# Patient Record
Sex: Female | Born: 1960
Health system: Southern US, Community
[De-identification: ages and names within clinical notes are randomized; demographics above are authoritative.]

## PROBLEM LIST (undated history)

## (undated) DIAGNOSIS — E785 Hyperlipidemia, unspecified: Secondary | ICD-10-CM

## (undated) DIAGNOSIS — I129 Hypertensive chronic kidney disease with stage 1 through stage 4 chronic kidney disease, or unspecified chronic kidney disease: Secondary | ICD-10-CM

## (undated) DIAGNOSIS — R634 Abnormal weight loss: Secondary | ICD-10-CM

## (undated) DIAGNOSIS — J45909 Unspecified asthma, uncomplicated: Secondary | ICD-10-CM

## (undated) DIAGNOSIS — F331 Major depressive disorder, recurrent, moderate: Secondary | ICD-10-CM

## (undated) DIAGNOSIS — F32A Depression, unspecified: Secondary | ICD-10-CM

## (undated) DIAGNOSIS — R0600 Dyspnea, unspecified: Secondary | ICD-10-CM

## (undated) DIAGNOSIS — J209 Acute bronchitis, unspecified: Secondary | ICD-10-CM

## (undated) DIAGNOSIS — F329 Major depressive disorder, single episode, unspecified: Secondary | ICD-10-CM

## (undated) DIAGNOSIS — I10 Essential (primary) hypertension: Secondary | ICD-10-CM

## (undated) DIAGNOSIS — M1990 Unspecified osteoarthritis, unspecified site: Secondary | ICD-10-CM

## (undated) DIAGNOSIS — B20 Human immunodeficiency virus [HIV] disease: Secondary | ICD-10-CM

## (undated) DIAGNOSIS — Z21 Asymptomatic human immunodeficiency virus [HIV] infection status: Secondary | ICD-10-CM

## (undated) DIAGNOSIS — Z9981 Dependence on supplemental oxygen: Secondary | ICD-10-CM

## (undated) DIAGNOSIS — N289 Disorder of kidney and ureter, unspecified: Secondary | ICD-10-CM

## (undated) DIAGNOSIS — G4730 Sleep apnea, unspecified: Secondary | ICD-10-CM

## (undated) DIAGNOSIS — J440 Chronic obstructive pulmonary disease with acute lower respiratory infection: Secondary | ICD-10-CM

## (undated) DIAGNOSIS — K5900 Constipation, unspecified: Secondary | ICD-10-CM

## (undated) HISTORY — DX: Chronic obstructive pulmonary disease with (acute) lower respiratory infection: J44.0

## (undated) HISTORY — DX: Abnormal weight loss: R63.4

## (undated) HISTORY — DX: Human immunodeficiency virus (HIV) disease: B20

## (undated) HISTORY — DX: Hyperlipidemia, unspecified: E78.5

## (undated) HISTORY — DX: Asymptomatic human immunodeficiency virus (hiv) infection status: Z21

## (undated) HISTORY — DX: Hypertensive chronic kidney disease with stage 1 through stage 4 chronic kidney disease, or unspecified chronic kidney disease: I12.9

## (undated) HISTORY — DX: Major depressive disorder, single episode, unspecified: F32.9

## (undated) HISTORY — DX: Major depressive disorder, recurrent, moderate: F33.1

## (undated) HISTORY — DX: Constipation, unspecified: K59.00

## (undated) HISTORY — DX: Unspecified asthma, uncomplicated: J45.909

## (undated) HISTORY — DX: Essential (primary) hypertension: I10

## (undated) HISTORY — DX: Depression, unspecified: F32.A

## (undated) HISTORY — DX: Unspecified osteoarthritis, unspecified site: M19.90

## (undated) HISTORY — DX: Acute bronchitis, unspecified: J20.9

## (undated) HISTORY — DX: Disorder of kidney and ureter, unspecified: N28.9

---

## 1997-07-10 ENCOUNTER — Emergency Department (HOSPITAL_COMMUNITY): Admission: EM | Admit: 1997-07-10 | Discharge: 1997-07-10 | Payer: Self-pay | Admitting: Emergency Medicine

## 1997-08-07 ENCOUNTER — Other Ambulatory Visit: Admission: RE | Admit: 1997-08-07 | Discharge: 1997-08-07 | Payer: Self-pay | Admitting: Family Medicine

## 1997-10-24 ENCOUNTER — Encounter: Payer: Self-pay | Admitting: Family Medicine

## 1997-10-24 ENCOUNTER — Ambulatory Visit (HOSPITAL_COMMUNITY): Admission: RE | Admit: 1997-10-24 | Discharge: 1997-10-24 | Payer: Self-pay | Admitting: Family Medicine

## 2000-04-01 ENCOUNTER — Inpatient Hospital Stay (HOSPITAL_COMMUNITY): Admission: EM | Admit: 2000-04-01 | Discharge: 2000-04-04 | Payer: Self-pay | Admitting: Emergency Medicine

## 2000-04-01 ENCOUNTER — Encounter: Payer: Self-pay | Admitting: Emergency Medicine

## 2000-04-02 ENCOUNTER — Encounter: Payer: Self-pay | Admitting: Infectious Diseases

## 2001-03-22 ENCOUNTER — Other Ambulatory Visit: Admission: RE | Admit: 2001-03-22 | Discharge: 2001-03-22 | Payer: Self-pay | Admitting: Family Medicine

## 2004-03-26 ENCOUNTER — Ambulatory Visit: Payer: Self-pay | Admitting: Family Medicine

## 2004-03-27 ENCOUNTER — Ambulatory Visit (HOSPITAL_COMMUNITY): Admission: RE | Admit: 2004-03-27 | Discharge: 2004-03-27 | Payer: Self-pay | Admitting: Family Medicine

## 2004-03-28 ENCOUNTER — Ambulatory Visit: Payer: Self-pay | Admitting: Family Medicine

## 2004-06-20 ENCOUNTER — Ambulatory Visit: Payer: Self-pay | Admitting: Family Medicine

## 2004-07-03 ENCOUNTER — Ambulatory Visit (HOSPITAL_COMMUNITY): Admission: RE | Admit: 2004-07-03 | Discharge: 2004-07-03 | Payer: Self-pay | Admitting: Family Medicine

## 2004-07-23 ENCOUNTER — Other Ambulatory Visit: Admission: RE | Admit: 2004-07-23 | Discharge: 2004-07-23 | Payer: Self-pay | Admitting: Family Medicine

## 2004-07-23 ENCOUNTER — Ambulatory Visit: Payer: Self-pay | Admitting: Family Medicine

## 2004-07-31 ENCOUNTER — Ambulatory Visit (HOSPITAL_COMMUNITY): Admission: RE | Admit: 2004-07-31 | Discharge: 2004-07-31 | Payer: Self-pay | Admitting: Family Medicine

## 2004-08-07 ENCOUNTER — Ambulatory Visit: Payer: Self-pay | Admitting: Family Medicine

## 2004-09-25 ENCOUNTER — Ambulatory Visit: Payer: Self-pay | Admitting: Family Medicine

## 2004-10-16 ENCOUNTER — Ambulatory Visit: Payer: Self-pay | Admitting: Infectious Diseases

## 2004-10-16 ENCOUNTER — Encounter (INDEPENDENT_AMBULATORY_CARE_PROVIDER_SITE_OTHER): Payer: Self-pay | Admitting: *Deleted

## 2004-10-16 ENCOUNTER — Ambulatory Visit (HOSPITAL_COMMUNITY): Admission: RE | Admit: 2004-10-16 | Discharge: 2004-10-16 | Payer: Self-pay | Admitting: Infectious Diseases

## 2004-10-16 LAB — CONVERTED CEMR LAB: CD4 Count: 70 microliters

## 2004-10-24 ENCOUNTER — Ambulatory Visit: Payer: Self-pay | Admitting: Family Medicine

## 2004-11-13 ENCOUNTER — Ambulatory Visit: Payer: Self-pay | Admitting: Infectious Diseases

## 2004-11-27 ENCOUNTER — Ambulatory Visit: Payer: Self-pay | Admitting: Family Medicine

## 2004-12-11 ENCOUNTER — Encounter (INDEPENDENT_AMBULATORY_CARE_PROVIDER_SITE_OTHER): Payer: Self-pay | Admitting: *Deleted

## 2004-12-11 ENCOUNTER — Ambulatory Visit: Payer: Self-pay | Admitting: Infectious Diseases

## 2004-12-11 ENCOUNTER — Ambulatory Visit (HOSPITAL_COMMUNITY): Admission: RE | Admit: 2004-12-11 | Discharge: 2004-12-11 | Payer: Self-pay | Admitting: Infectious Diseases

## 2004-12-18 ENCOUNTER — Ambulatory Visit: Payer: Self-pay | Admitting: Family Medicine

## 2005-01-01 ENCOUNTER — Ambulatory Visit: Payer: Self-pay | Admitting: Family Medicine

## 2005-01-15 ENCOUNTER — Ambulatory Visit: Payer: Self-pay | Admitting: Family Medicine

## 2005-02-06 ENCOUNTER — Ambulatory Visit: Payer: Self-pay | Admitting: Family Medicine

## 2005-02-06 ENCOUNTER — Encounter: Payer: Self-pay | Admitting: Family Medicine

## 2005-02-06 ENCOUNTER — Other Ambulatory Visit: Admission: RE | Admit: 2005-02-06 | Discharge: 2005-02-06 | Payer: Self-pay | Admitting: Family Medicine

## 2005-02-12 ENCOUNTER — Ambulatory Visit: Payer: Self-pay | Admitting: Infectious Diseases

## 2005-02-12 ENCOUNTER — Encounter (INDEPENDENT_AMBULATORY_CARE_PROVIDER_SITE_OTHER): Payer: Self-pay | Admitting: *Deleted

## 2005-02-12 LAB — CONVERTED CEMR LAB: HIV 1 RNA Quant: 399 copies/mL

## 2005-03-05 ENCOUNTER — Ambulatory Visit: Payer: Self-pay | Admitting: Family Medicine

## 2005-05-28 ENCOUNTER — Encounter: Admission: RE | Admit: 2005-05-28 | Discharge: 2005-05-28 | Payer: Self-pay | Admitting: Infectious Diseases

## 2005-05-28 ENCOUNTER — Ambulatory Visit: Payer: Self-pay | Admitting: Infectious Diseases

## 2005-05-28 ENCOUNTER — Encounter (INDEPENDENT_AMBULATORY_CARE_PROVIDER_SITE_OTHER): Payer: Self-pay | Admitting: *Deleted

## 2005-05-28 LAB — CONVERTED CEMR LAB: CD4 Count: 300 microliters

## 2005-06-17 ENCOUNTER — Ambulatory Visit: Payer: Self-pay | Admitting: Family Medicine

## 2005-06-18 ENCOUNTER — Ambulatory Visit: Payer: Self-pay | Admitting: Family Medicine

## 2005-06-22 ENCOUNTER — Ambulatory Visit: Payer: Self-pay | Admitting: Family Medicine

## 2005-07-24 ENCOUNTER — Ambulatory Visit: Payer: Self-pay | Admitting: Infectious Diseases

## 2005-10-16 ENCOUNTER — Ambulatory Visit: Payer: Self-pay | Admitting: Family Medicine

## 2005-11-13 ENCOUNTER — Ambulatory Visit: Payer: Self-pay | Admitting: Family Medicine

## 2005-11-20 ENCOUNTER — Ambulatory Visit: Payer: Self-pay | Admitting: Family Medicine

## 2005-11-23 ENCOUNTER — Ambulatory Visit: Payer: Self-pay | Admitting: Family Medicine

## 2005-11-26 ENCOUNTER — Encounter: Admission: RE | Admit: 2005-11-26 | Discharge: 2005-11-26 | Payer: Self-pay | Admitting: Infectious Diseases

## 2005-11-26 ENCOUNTER — Ambulatory Visit: Payer: Self-pay | Admitting: Infectious Diseases

## 2005-11-26 ENCOUNTER — Encounter (INDEPENDENT_AMBULATORY_CARE_PROVIDER_SITE_OTHER): Payer: Self-pay | Admitting: *Deleted

## 2005-11-26 LAB — CONVERTED CEMR LAB
ALT: 30 units/L (ref 0–35)
AST: 22 units/L (ref 0–37)
Albumin: 3.8 g/dL (ref 3.5–5.2)
BUN: 16 mg/dL (ref 6–23)
Basophils Absolute: 0.1 10*3/uL (ref 0.0–0.1)
Basophils Relative: 1 % (ref 0–1)
CO2: 22 meq/L (ref 19–32)
Calcium: 9.2 mg/dL (ref 8.4–10.5)
Chloride: 106 meq/L (ref 96–112)
HIV 1 RNA Quant: <50 copies/mL (ref <50)
Lymphs Abs: 2.9 10*3/uL (ref 0.8–3.1)
MCV: 94.2 fL (ref 78.8–100.0)
Neutro Abs: 4.7 10*3/uL (ref 1.8–6.8)
Neutrophils Relative %: 55 % (ref 47–77)
Platelets: 407 10*3/uL — ABNORMAL HIGH (ref 152–374)
Potassium: 4 meq/L (ref 3.5–5.3)
RBC: 3.95 M/uL (ref 3.79–4.96)
WBC: 8.6 10*3/uL (ref 3.7–10.0)

## 2005-12-17 ENCOUNTER — Ambulatory Visit: Payer: Self-pay | Admitting: Infectious Diseases

## 2006-01-26 DIAGNOSIS — B20 Human immunodeficiency virus [HIV] disease: Secondary | ICD-10-CM | POA: Insufficient documentation

## 2006-01-26 DIAGNOSIS — J45909 Unspecified asthma, uncomplicated: Secondary | ICD-10-CM | POA: Insufficient documentation

## 2006-01-26 DIAGNOSIS — F329 Major depressive disorder, single episode, unspecified: Secondary | ICD-10-CM

## 2006-01-26 DIAGNOSIS — F3289 Other specified depressive episodes: Secondary | ICD-10-CM | POA: Insufficient documentation

## 2006-01-26 DIAGNOSIS — I10 Essential (primary) hypertension: Secondary | ICD-10-CM | POA: Insufficient documentation

## 2006-02-03 ENCOUNTER — Encounter: Payer: Self-pay | Admitting: Infectious Diseases

## 2006-02-15 ENCOUNTER — Ambulatory Visit: Payer: Self-pay | Admitting: Family Medicine

## 2006-03-08 ENCOUNTER — Encounter (INDEPENDENT_AMBULATORY_CARE_PROVIDER_SITE_OTHER): Payer: Self-pay | Admitting: *Deleted

## 2006-03-08 LAB — CONVERTED CEMR LAB

## 2006-03-21 ENCOUNTER — Encounter (INDEPENDENT_AMBULATORY_CARE_PROVIDER_SITE_OTHER): Payer: Self-pay | Admitting: *Deleted

## 2006-04-08 ENCOUNTER — Ambulatory Visit: Payer: Self-pay | Admitting: Infectious Diseases

## 2006-04-08 DIAGNOSIS — G609 Hereditary and idiopathic neuropathy, unspecified: Secondary | ICD-10-CM | POA: Insufficient documentation

## 2006-05-14 ENCOUNTER — Ambulatory Visit: Payer: Self-pay | Admitting: Internal Medicine

## 2006-07-19 ENCOUNTER — Encounter: Admission: RE | Admit: 2006-07-19 | Discharge: 2006-07-19 | Payer: Self-pay | Admitting: Infectious Diseases

## 2006-07-19 ENCOUNTER — Ambulatory Visit: Payer: Self-pay | Admitting: Infectious Diseases

## 2006-07-19 LAB — CONVERTED CEMR LAB
ALT: 23 units/L (ref 0–35)
Basophils Absolute: 0.1 10*3/uL (ref 0.0–0.1)
Basophils Relative: 1 % (ref 0–1)
CO2: 20 meq/L (ref 19–32)
Creatinine, Ser: 0.75 mg/dL (ref 0.40–1.20)
Eosinophils Absolute: 0.3 10*3/uL (ref 0.0–0.7)
Eosinophils Relative: 4 % (ref 0–5)
HCT: 40.4 % (ref 36.0–46.0)
HIV 1 RNA Quant: <50 copies/mL (ref <50)
Hemoglobin: 13.4 g/dL (ref 12.0–15.0)
MCHC: 33.2 g/dL (ref 30.0–36.0)
MCV: 94 fL (ref 78.0–100.0)
Monocytes Absolute: 0.5 10*3/uL (ref 0.2–0.7)
RDW: 15.4 % — ABNORMAL HIGH (ref 11.5–14.0)
Total Bilirubin: 0.3 mg/dL (ref 0.3–1.2)

## 2006-07-20 ENCOUNTER — Encounter: Payer: Self-pay | Admitting: Infectious Diseases

## 2006-08-09 ENCOUNTER — Ambulatory Visit: Payer: Self-pay | Admitting: Internal Medicine

## 2006-10-07 ENCOUNTER — Ambulatory Visit: Payer: Self-pay | Admitting: Infectious Diseases

## 2006-10-08 ENCOUNTER — Telehealth: Payer: Self-pay | Admitting: Infectious Diseases

## 2006-10-14 ENCOUNTER — Telehealth: Payer: Self-pay | Admitting: Infectious Diseases

## 2006-11-01 ENCOUNTER — Ambulatory Visit: Payer: Self-pay | Admitting: Internal Medicine

## 2006-12-14 ENCOUNTER — Encounter: Payer: Self-pay | Admitting: Infectious Diseases

## 2007-01-24 ENCOUNTER — Ambulatory Visit: Payer: Self-pay | Admitting: Internal Medicine

## 2007-01-25 ENCOUNTER — Telehealth (INDEPENDENT_AMBULATORY_CARE_PROVIDER_SITE_OTHER): Payer: Self-pay | Admitting: *Deleted

## 2007-04-14 ENCOUNTER — Ambulatory Visit: Payer: Self-pay | Admitting: Infectious Diseases

## 2007-04-14 ENCOUNTER — Encounter: Admission: RE | Admit: 2007-04-14 | Discharge: 2007-04-14 | Payer: Self-pay | Admitting: Infectious Diseases

## 2007-04-14 LAB — CONVERTED CEMR LAB
Albumin: 4.5 g/dL (ref 3.5–5.2)
BUN: 15 mg/dL (ref 6–23)
CO2: 21 meq/L (ref 19–32)
Calcium: 9.4 mg/dL (ref 8.4–10.5)
Chloride: 109 meq/L (ref 96–112)
Eosinophils Absolute: 0.2 10*3/uL (ref 0.0–0.7)
Glucose, Bld: 85 mg/dL (ref 70–99)
HIV 1 RNA Quant: <50 copies/mL (ref <50)
HIV-1 RNA Quant, Log: <1.7 (ref <1.70)
Lymphs Abs: 3 10*3/uL (ref 0.7–4.0)
MCV: 91.6 fL (ref 78.0–100.0)
Monocytes Relative: 8 % (ref 3–12)
Neutro Abs: 3.8 10*3/uL (ref 1.7–7.7)
Neutrophils Relative %: 49 % (ref 43–77)
Potassium: 4 meq/L (ref 3.5–5.3)
RBC: 4.42 M/uL (ref 3.87–5.11)
WBC: 7.8 10*3/uL (ref 4.0–10.5)

## 2007-05-02 ENCOUNTER — Ambulatory Visit: Payer: Self-pay | Admitting: Internal Medicine

## 2007-05-02 LAB — CONVERTED CEMR LAB: Preg, Serum: NEGATIVE

## 2007-05-03 ENCOUNTER — Encounter (INDEPENDENT_AMBULATORY_CARE_PROVIDER_SITE_OTHER): Payer: Self-pay | Admitting: Internal Medicine

## 2007-05-03 ENCOUNTER — Ambulatory Visit: Payer: Self-pay | Admitting: Internal Medicine

## 2007-05-03 ENCOUNTER — Encounter (INDEPENDENT_AMBULATORY_CARE_PROVIDER_SITE_OTHER): Payer: Self-pay | Admitting: *Deleted

## 2007-05-03 ENCOUNTER — Other Ambulatory Visit: Admission: RE | Admit: 2007-05-03 | Discharge: 2007-05-03 | Payer: Self-pay | Admitting: Internal Medicine

## 2007-05-03 DIAGNOSIS — E049 Nontoxic goiter, unspecified: Secondary | ICD-10-CM | POA: Insufficient documentation

## 2007-05-03 LAB — CONVERTED CEMR LAB
Nitrite: NEGATIVE
Specific Gravity, Urine: 1.02
WBC Urine, dipstick: NEGATIVE

## 2007-05-05 ENCOUNTER — Ambulatory Visit (HOSPITAL_COMMUNITY): Admission: RE | Admit: 2007-05-05 | Discharge: 2007-05-05 | Payer: Self-pay | Admitting: Internal Medicine

## 2007-05-15 ENCOUNTER — Encounter (INDEPENDENT_AMBULATORY_CARE_PROVIDER_SITE_OTHER): Payer: Self-pay | Admitting: Internal Medicine

## 2007-05-15 LAB — CONVERTED CEMR LAB
Cholesterol: 222 mg/dL — ABNORMAL HIGH (ref 0–200)
HDL: 53 mg/dL (ref >39)
LDL Cholesterol: 131 mg/dL — ABNORMAL HIGH (ref 0–99)
Triglycerides: 189 mg/dL — ABNORMAL HIGH (ref <150)
VLDL: 38 mg/dL (ref 0–40)

## 2007-05-31 ENCOUNTER — Encounter (INDEPENDENT_AMBULATORY_CARE_PROVIDER_SITE_OTHER): Payer: Self-pay | Admitting: *Deleted

## 2007-07-25 ENCOUNTER — Ambulatory Visit: Payer: Self-pay | Admitting: Internal Medicine

## 2007-08-04 ENCOUNTER — Ambulatory Visit: Payer: Self-pay | Admitting: Infectious Diseases

## 2007-10-17 ENCOUNTER — Encounter (INDEPENDENT_AMBULATORY_CARE_PROVIDER_SITE_OTHER): Payer: Self-pay | Admitting: Internal Medicine

## 2007-10-21 ENCOUNTER — Ambulatory Visit: Payer: Self-pay | Admitting: Internal Medicine

## 2007-12-05 ENCOUNTER — Ambulatory Visit: Payer: Self-pay | Admitting: Infectious Diseases

## 2007-12-05 LAB — CONVERTED CEMR LAB
ALT: 12 units/L (ref 0–35)
AST: 11 units/L (ref 0–37)
Albumin: 4 g/dL (ref 3.5–5.2)
Alkaline Phosphatase: 211 units/L — ABNORMAL HIGH (ref 39–117)
BUN: 14 mg/dL (ref 6–23)
Basophils Absolute: 0 10*3/uL (ref 0.0–0.1)
Basophils Relative: 1 % (ref 0–1)
CO2: 19 meq/L (ref 19–32)
Calcium: 8.7 mg/dL (ref 8.4–10.5)
Chloride: 110 meq/L (ref 96–112)
Creatinine, Ser: 0.73 mg/dL (ref 0.40–1.20)
Eosinophils Absolute: 0.2 10*3/uL (ref 0.0–0.7)
Eosinophils Relative: 3 % (ref 0–5)
Glucose, Bld: 86 mg/dL (ref 70–99)
HCT: 37.7 % (ref 36.0–46.0)
HIV 1 RNA Quant: 475 copies/mL — ABNORMAL HIGH (ref <48)
HIV-1 RNA Quant, Log: 2.68 — ABNORMAL HIGH (ref <1.68)
Hemoglobin: 13 g/dL (ref 12.0–15.0)
Lymphocytes Relative: 41 % (ref 12–46)
Lymphs Abs: 2.8 10*3/uL (ref 0.7–4.0)
MCHC: 34.5 g/dL (ref 30.0–36.0)
MCV: 93.8 fL (ref 78.0–100.0)
Monocytes Absolute: 0.6 10*3/uL (ref 0.1–1.0)
Monocytes Relative: 8 % (ref 3–12)
Neutro Abs: 3.2 10*3/uL (ref 1.7–7.7)
Neutrophils Relative %: 47 % (ref 43–77)
Platelets: 367 10*3/uL (ref 150–400)
Potassium: 4.3 meq/L (ref 3.5–5.3)
RBC: 4.02 M/uL (ref 3.87–5.11)
RDW: 15.5 % (ref 11.5–15.5)
Sodium: 142 meq/L (ref 135–145)
Total Bilirubin: 0.4 mg/dL (ref 0.3–1.2)
Total Protein: 7.1 g/dL (ref 6.0–8.3)
WBC: 6.8 10*3/uL (ref 4.0–10.5)

## 2007-12-22 ENCOUNTER — Ambulatory Visit: Payer: Self-pay | Admitting: Infectious Diseases

## 2008-01-12 ENCOUNTER — Ambulatory Visit: Payer: Self-pay | Admitting: Internal Medicine

## 2008-04-04 ENCOUNTER — Telehealth (INDEPENDENT_AMBULATORY_CARE_PROVIDER_SITE_OTHER): Payer: Self-pay | Admitting: Internal Medicine

## 2008-04-04 ENCOUNTER — Ambulatory Visit: Payer: Self-pay | Admitting: Internal Medicine

## 2008-05-02 ENCOUNTER — Ambulatory Visit: Payer: Self-pay | Admitting: Infectious Diseases

## 2008-05-02 LAB — CONVERTED CEMR LAB
ALT: 19 units/L (ref 0–35)
Albumin: 4 g/dL (ref 3.5–5.2)
CO2: 18 meq/L — ABNORMAL LOW (ref 19–32)
Calcium: 8.9 mg/dL (ref 8.4–10.5)
Chloride: 110 meq/L (ref 96–112)
GFR calc Af Amer: >60 mL/min (ref >60)
GFR calc non Af Amer: >60 mL/min (ref >60)
Glucose, Bld: 84 mg/dL (ref 70–99)
HIV 1 RNA Quant: <48 copies/mL (ref <48)
Potassium: 4.5 meq/L (ref 3.5–5.3)
Sodium: 141 meq/L (ref 135–145)
Total Protein: 7.5 g/dL (ref 6.0–8.3)

## 2008-05-03 ENCOUNTER — Other Ambulatory Visit: Admission: RE | Admit: 2008-05-03 | Discharge: 2008-05-03 | Payer: Self-pay | Admitting: Internal Medicine

## 2008-05-03 ENCOUNTER — Ambulatory Visit: Payer: Self-pay | Admitting: Internal Medicine

## 2008-05-03 ENCOUNTER — Encounter (INDEPENDENT_AMBULATORY_CARE_PROVIDER_SITE_OTHER): Payer: Self-pay | Admitting: Internal Medicine

## 2008-05-03 DIAGNOSIS — A5901 Trichomonal vulvovaginitis: Secondary | ICD-10-CM | POA: Insufficient documentation

## 2008-05-03 DIAGNOSIS — N951 Menopausal and female climacteric states: Secondary | ICD-10-CM | POA: Insufficient documentation

## 2008-05-03 DIAGNOSIS — R3129 Other microscopic hematuria: Secondary | ICD-10-CM | POA: Insufficient documentation

## 2008-05-03 DIAGNOSIS — K921 Melena: Secondary | ICD-10-CM | POA: Insufficient documentation

## 2008-05-03 DIAGNOSIS — E782 Mixed hyperlipidemia: Secondary | ICD-10-CM | POA: Insufficient documentation

## 2008-05-03 LAB — CONVERTED CEMR LAB
Bilirubin Urine: NEGATIVE
Cholesterol: 217 mg/dL — ABNORMAL HIGH (ref 0–200)
FSH: 5.4 milliintl units/mL
GC Probe Amp, Genital: NEGATIVE
Glucose, Urine, Semiquant: NEGATIVE
Protein, U semiquant: 100
Specific Gravity, Urine: >=1.03
Total CHOL/HDL Ratio: 3.7
Urobilinogen, UA: 0.2
VLDL: 26 mg/dL (ref 0–40)
WBC Urine, dipstick: NEGATIVE
pH: 5.5

## 2008-05-10 ENCOUNTER — Ambulatory Visit (HOSPITAL_COMMUNITY): Admission: RE | Admit: 2008-05-10 | Discharge: 2008-05-10 | Payer: Self-pay | Admitting: Internal Medicine

## 2008-05-10 ENCOUNTER — Encounter (INDEPENDENT_AMBULATORY_CARE_PROVIDER_SITE_OTHER): Payer: Self-pay | Admitting: Internal Medicine

## 2008-05-13 ENCOUNTER — Encounter (INDEPENDENT_AMBULATORY_CARE_PROVIDER_SITE_OTHER): Payer: Self-pay | Admitting: Internal Medicine

## 2008-05-17 ENCOUNTER — Ambulatory Visit: Payer: Self-pay | Admitting: Internal Medicine

## 2008-05-17 DIAGNOSIS — R319 Hematuria, unspecified: Secondary | ICD-10-CM | POA: Insufficient documentation

## 2008-05-18 ENCOUNTER — Encounter (INDEPENDENT_AMBULATORY_CARE_PROVIDER_SITE_OTHER): Payer: Self-pay | Admitting: Internal Medicine

## 2008-05-24 ENCOUNTER — Ambulatory Visit: Payer: Self-pay | Admitting: Infectious Diseases

## 2008-05-28 LAB — CONVERTED CEMR LAB
Nitrite: NEGATIVE
Protein, U semiquant: 100
Specific Gravity, Urine: 1.025
Urobilinogen, UA: 0.2
WBC Urine, dipstick: NEGATIVE
pH: 5.5

## 2008-05-31 ENCOUNTER — Telehealth (INDEPENDENT_AMBULATORY_CARE_PROVIDER_SITE_OTHER): Payer: Self-pay | Admitting: Internal Medicine

## 2008-06-20 ENCOUNTER — Encounter (INDEPENDENT_AMBULATORY_CARE_PROVIDER_SITE_OTHER): Payer: Self-pay | Admitting: Internal Medicine

## 2008-06-26 ENCOUNTER — Telehealth (INDEPENDENT_AMBULATORY_CARE_PROVIDER_SITE_OTHER): Payer: Self-pay | Admitting: Internal Medicine

## 2008-06-26 ENCOUNTER — Ambulatory Visit: Payer: Self-pay | Admitting: Internal Medicine

## 2008-09-18 ENCOUNTER — Ambulatory Visit: Payer: Self-pay | Admitting: Internal Medicine

## 2008-11-09 ENCOUNTER — Ambulatory Visit: Payer: Self-pay | Admitting: Infectious Diseases

## 2008-12-10 ENCOUNTER — Telehealth (INDEPENDENT_AMBULATORY_CARE_PROVIDER_SITE_OTHER): Payer: Self-pay | Admitting: Internal Medicine

## 2008-12-10 ENCOUNTER — Ambulatory Visit: Payer: Self-pay | Admitting: Internal Medicine

## 2009-03-04 ENCOUNTER — Telehealth (INDEPENDENT_AMBULATORY_CARE_PROVIDER_SITE_OTHER): Payer: Self-pay | Admitting: Internal Medicine

## 2009-03-04 ENCOUNTER — Ambulatory Visit: Payer: Self-pay | Admitting: Internal Medicine

## 2009-03-28 ENCOUNTER — Ambulatory Visit: Payer: Self-pay | Admitting: Infectious Diseases

## 2009-03-28 LAB — CONVERTED CEMR LAB
AST: 22 units/L (ref 0–37)
Alkaline Phosphatase: 230 units/L — ABNORMAL HIGH (ref 39–117)
Basophils Absolute: 0.1 10*3/uL (ref 0.0–0.1)
Basophils Relative: 1 % (ref 0–1)
Glucose, Bld: 87 mg/dL (ref 70–99)
HIV-1 RNA Quant, Log: 2.2 — ABNORMAL HIGH (ref <1.68)
Lymphocytes Relative: 39 % (ref 12–46)
MCHC: 33.8 g/dL (ref 30.0–36.0)
Monocytes Absolute: 0.5 10*3/uL (ref 0.1–1.0)
Neutro Abs: 3.1 10*3/uL (ref 1.7–7.7)
Neutrophils Relative %: 50 % (ref 43–77)
Platelets: 402 10*3/uL — ABNORMAL HIGH (ref 150–400)
RDW: 15 % (ref 11.5–15.5)
Sodium: 140 meq/L (ref 135–145)
Total Bilirubin: 0.3 mg/dL (ref 0.3–1.2)
Total Protein: 7.4 g/dL (ref 6.0–8.3)

## 2009-04-16 ENCOUNTER — Encounter (INDEPENDENT_AMBULATORY_CARE_PROVIDER_SITE_OTHER): Payer: Self-pay | Admitting: Internal Medicine

## 2009-05-09 ENCOUNTER — Ambulatory Visit: Payer: Self-pay | Admitting: Internal Medicine

## 2009-05-09 ENCOUNTER — Other Ambulatory Visit: Admission: RE | Admit: 2009-05-09 | Discharge: 2009-05-09 | Payer: Self-pay | Admitting: Internal Medicine

## 2009-05-09 DIAGNOSIS — R748 Abnormal levels of other serum enzymes: Secondary | ICD-10-CM | POA: Insufficient documentation

## 2009-05-09 DIAGNOSIS — J309 Allergic rhinitis, unspecified: Secondary | ICD-10-CM | POA: Insufficient documentation

## 2009-05-09 LAB — CONVERTED CEMR LAB
Ketones, urine, test strip: NEGATIVE
Nitrite: NEGATIVE
Protein, U semiquant: 100
Specific Gravity, Urine: 1.025
Whiff Test: NEGATIVE
pH: 5.5

## 2009-05-10 ENCOUNTER — Encounter: Payer: Self-pay | Admitting: Infectious Diseases

## 2009-05-20 ENCOUNTER — Ambulatory Visit: Payer: Self-pay | Admitting: Internal Medicine

## 2009-05-27 ENCOUNTER — Ambulatory Visit: Payer: Self-pay | Admitting: Internal Medicine

## 2009-06-06 ENCOUNTER — Ambulatory Visit: Payer: Self-pay | Admitting: Infectious Diseases

## 2009-06-06 ENCOUNTER — Ambulatory Visit: Payer: Self-pay | Admitting: Internal Medicine

## 2009-06-07 ENCOUNTER — Encounter (INDEPENDENT_AMBULATORY_CARE_PROVIDER_SITE_OTHER): Payer: Self-pay | Admitting: Internal Medicine

## 2009-06-07 DIAGNOSIS — E7800 Pure hypercholesterolemia, unspecified: Secondary | ICD-10-CM | POA: Insufficient documentation

## 2009-06-07 DIAGNOSIS — R1011 Right upper quadrant pain: Secondary | ICD-10-CM | POA: Insufficient documentation

## 2009-06-07 DIAGNOSIS — N949 Unspecified condition associated with female genital organs and menstrual cycle: Secondary | ICD-10-CM | POA: Insufficient documentation

## 2009-06-07 DIAGNOSIS — N938 Other specified abnormal uterine and vaginal bleeding: Secondary | ICD-10-CM | POA: Insufficient documentation

## 2009-06-07 DIAGNOSIS — J019 Acute sinusitis, unspecified: Secondary | ICD-10-CM | POA: Insufficient documentation

## 2009-06-09 LAB — CONVERTED CEMR LAB
Alkaline Phosphatase: 234 units/L — ABNORMAL HIGH (ref 39–117)
Casts: NONE SEEN /lpf
Chlamydia, DNA Probe: NEGATIVE
Indirect Bilirubin: 0.3 mg/dL (ref 0.0–0.9)
LDL Cholesterol: 116 mg/dL — ABNORMAL HIGH (ref 0–99)
Total Bilirubin: 0.4 mg/dL (ref 0.3–1.2)
Triglycerides: 125 mg/dL (ref <150)
WBC, UA: NONE SEEN cells/hpf (ref <3)

## 2009-06-19 ENCOUNTER — Encounter (INDEPENDENT_AMBULATORY_CARE_PROVIDER_SITE_OTHER): Payer: Self-pay | Admitting: Internal Medicine

## 2009-06-19 ENCOUNTER — Ambulatory Visit (HOSPITAL_COMMUNITY): Admission: RE | Admit: 2009-06-19 | Discharge: 2009-06-19 | Payer: Self-pay | Admitting: Internal Medicine

## 2009-06-19 DIAGNOSIS — D259 Leiomyoma of uterus, unspecified: Secondary | ICD-10-CM | POA: Insufficient documentation

## 2009-06-21 ENCOUNTER — Ambulatory Visit: Payer: Self-pay | Admitting: Internal Medicine

## 2009-08-02 ENCOUNTER — Ambulatory Visit: Payer: Self-pay | Admitting: Internal Medicine

## 2009-08-20 ENCOUNTER — Ambulatory Visit: Payer: Self-pay | Admitting: Internal Medicine

## 2009-10-03 ENCOUNTER — Ambulatory Visit: Payer: Self-pay | Admitting: Internal Medicine

## 2009-10-17 ENCOUNTER — Ambulatory Visit: Payer: Self-pay | Admitting: Infectious Diseases

## 2009-10-17 LAB — CONVERTED CEMR LAB
Alkaline Phosphatase: 234 units/L — ABNORMAL HIGH (ref 39–117)
Basophils Absolute: 0 10*3/uL (ref 0.0–0.1)
Eosinophils Absolute: 0.2 10*3/uL (ref 0.0–0.7)
Eosinophils Relative: 3 % (ref 0–5)
Glucose, Bld: 82 mg/dL (ref 70–99)
HCT: 37.3 % (ref 36.0–46.0)
HIV 1 RNA Quant: <20 copies/mL (ref <20)
HIV-1 RNA Quant, Log: <1.3 (ref <1.30)
Lymphs Abs: 3 10*3/uL (ref 0.7–4.0)
MCV: 90.3 fL (ref 78.0–100.0)
Platelets: 356 10*3/uL (ref 150–400)
RDW: 14.9 % (ref 11.5–15.5)
Sodium: 138 meq/L (ref 135–145)
Total Bilirubin: 0.3 mg/dL (ref 0.3–1.2)
Total Protein: 7.4 g/dL (ref 6.0–8.3)

## 2009-11-11 ENCOUNTER — Ambulatory Visit: Payer: Self-pay | Admitting: Internal Medicine

## 2009-11-14 ENCOUNTER — Ambulatory Visit: Payer: Self-pay | Admitting: Infectious Diseases

## 2009-12-03 ENCOUNTER — Encounter (INDEPENDENT_AMBULATORY_CARE_PROVIDER_SITE_OTHER): Payer: Self-pay | Admitting: *Deleted

## 2010-01-02 ENCOUNTER — Ambulatory Visit: Payer: Self-pay | Admitting: Internal Medicine

## 2010-02-02 ENCOUNTER — Encounter: Payer: Self-pay | Admitting: Internal Medicine

## 2010-02-03 ENCOUNTER — Ambulatory Visit
Admission: RE | Admit: 2010-02-03 | Discharge: 2010-02-03 | Payer: Self-pay | Source: Home / Self Care | Attending: Internal Medicine | Admitting: Internal Medicine

## 2010-02-13 NOTE — Assessment & Plan Note (Signed)
Summary: 1 MONTH FU ON DEPRESION//KT   Vital Signs:  Patient profile:   50 year old female Menstrual status:  DEPO Weight:      128 pounds Temp:     98.0 degrees F Pulse rate:   94 / minute Pulse rhythm:   regular Resp:     20 per minute BP sitting:   161 / 102  (left arm) Cuff size:   regular  Vitals Entered By: Vesta Mixer CMA (Jun 07, 2009 11:23 AM) CC: one month f/u on depression.   Just found out niece was murdered. Is Patient Diabetic? No  Does patient need assistance? Ambulation Normal   CC:  one month f/u on depression.   Just found out niece was murdered.Marland Kitchen  History of Present Illness: 1.  Depression: Niece she raised as her daughter was stabbed to death --body found just 3 days ago and niece's boyfriend arrested.  Had only been on Zoloft for 2 weeks preceding this, so hard to know if it has helped.  Having a much more difficult time now.  Lot of support.  Poor appetite.  Not sleeping for past week as niece was missing since 5/20.    2.  Microhematuria:  did not have red cells on microscopic.    3.  Pt. states she was having vaginal bleeding when last here and has continued since.  Mild cramping in mid pelvic region.  On Depo provera--did not stop when received last shot.  Has never had with Depo provera before.  FSH was recently in low normal range.  4.  Elevated Alk pHos--GTTP also elevated, suggesting liver source.  Does at times get RUQ cramping, though not associated with food.  5.  Hypertension:  remains high  6.  Allergies:  Using Cetirizine and Fluticasone regularly--having a lot of sinus pressure and pain.  Discolored mucous at times with streaking of blood.  7.  Hypercholesterolemia with good HDL--discusses levels are okay currently.  Current Medications (verified): 1)  Atripla 600-200-300 Mg Tabs (Efavirenz-Emtricitab-Tenofovir) .... Take 1 Tablet By Mouth Once A Day 2)  Depo-Provera 150 Mg/ml Im Susp (Medroxyprogesterone Acetate) .... Inject Im Q 3  Months--To Bring To Clinic For Injection 3)  Calcium-Carb 600 + D 600-125 Mg-Unit Tabs (Calcium-Vitamin D) .Marland Kitchen.. 1 Tab By Mouth Two Times A Day 4)  Lotensin Hct 20-12.5 Mg Tabs (Benazepril-Hydrochlorothiazide) .... One Tab By Mouth Daily 5)  Proair Hfa 108 (90 Base) Mcg/act Aers (Albuterol Sulfate) .... 2 Puffs Two Times A Day As Needed 6)  Zoloft 50 Mg Tabs (Sertraline Hcl) .... 1.2 Tab By Mouth Daily For 7 Days, Then Increase To 1 Tab By Mouth Daily 7)  Cetirizine Hcl 10 Mg Tabs (Cetirizine Hcl) .Marland Kitchen.. 1 Tab By Mouth Daily 8)  Fluticasone Propionate 50 Mcg/act Susp (Fluticasone Propionate) .... 2 Sprays Each Nostril Daily  Allergies (verified): 1)  ! * Dapsone 2)  ! * Abacavir 3)  Pcn  Physical Exam  General:  Tearful talking about her niece Head:  Tender over maxillary sinuses Ears:  External ear exam shows no significant lesions or deformities.  Otoscopic examination reveals clear canals, tympanic membranes are intact bilaterally without bulging, retraction, inflammation or discharge. Hearing is grossly normal bilaterally. Nose:  mucosal erythema and mucosal edema.   Mouth:  pharynx pink and moist.   Neck:  No deformities, masses, or tenderness noted. Lungs:  Normal respiratory effort, chest expands symmetrically. Lungs are clear to auscultation, no crackles or wheezes. Heart:  Normal rate and regular rhythm.  S1 and S2 normal without gallop, murmur, click, rub or other extra sounds. Abdomen:  Bowel sounds positive,abdomen soft and non-tender without masses, organomegaly or hernias noted.   Impression & Recommendations:  Problem # 1:  DYSFUNCTIONAL UTERINE BLEEDING (ICD-626.8)  Orders: Ultrasound (Ultrasound)  Problem # 2:  ABDOMINAL PAIN, RIGHT UPPER QUADRANT (ICD-789.01) with elevated Alk phos/GGT Abdominal ultrasound  Problem # 3:  HEMATURIA UNSPECIFIED (ICD-599.70)  No RBC on micro of urine  Her updated medication list for this problem includes:    Azithromycin 250 Mg  Tabs (Azithromycin) .Marland Kitchen... 2 tabs by mouth today, then 1 tab by mouth daily for 4 more days  Problem # 4:  HYPERTENSION (ICD-401.9) Add Amlodipine Her updated medication list for this problem includes:    Lotensin Hct 20-12.5 Mg Tabs (Benazepril-hydrochlorothiazide) ..... One tab by mouth daily    Amlodipine Besylate 5 Mg Tabs (Amlodipine besylate) .Marland Kitchen... 1 tab by mouth daily  Problem # 5:  DEPRESSION (ICD-311) More difficult with violent loss of niece Switch to med more likely to help appetite and sleep Her updated medication list for this problem includes:    Remeron 30 Mg Tabs (Mirtazapine) .Marland Kitchen... 1 tab by mouth at bedtime  Problem # 6:  SINUSITIS, ACUTE (ICD-461.9) Continue allergy meds Her updated medication list for this problem includes:    Fluticasone Propionate 50 Mcg/act Susp (Fluticasone propionate) .Marland Kitchen... 2 sprays each nostril daily    Azithromycin 250 Mg Tabs (Azithromycin) .Marland Kitchen... 2 tabs by mouth today, then 1 tab by mouth daily for 4 more days  Complete Medication List: 1)  Atripla 600-200-300 Mg Tabs (Efavirenz-emtricitab-tenofovir) .... Take 1 tablet by mouth once a day 2)  Depo-provera 150 Mg/ml Im Susp (Medroxyprogesterone acetate) .... Inject im q 3 months--to bring to clinic for injection 3)  Calcium-carb 600 + D 600-125 Mg-unit Tabs (Calcium-vitamin d) .Marland Kitchen.. 1 tab by mouth two times a day 4)  Lotensin Hct 20-12.5 Mg Tabs (Benazepril-hydrochlorothiazide) .... One tab by mouth daily 5)  Proair Hfa 108 (90 Base) Mcg/act Aers (Albuterol sulfate) .... 2 puffs two times a day as needed 6)  Remeron 30 Mg Tabs (Mirtazapine) .Marland Kitchen.. 1 tab by mouth at bedtime 7)  Cetirizine Hcl 10 Mg Tabs (Cetirizine hcl) .Marland Kitchen.. 1 tab by mouth daily 8)  Fluticasone Propionate 50 Mcg/act Susp (Fluticasone propionate) .... 2 sprays each nostril daily 9)  Amlodipine Besylate 5 Mg Tabs (Amlodipine besylate) .Marland Kitchen.. 1 tab by mouth daily 10)  Azithromycin 250 Mg Tabs (Azithromycin) .... 2 tabs by mouth today,  then 1 tab by mouth daily for 4 more days  Patient Instructions: 1)  Follow up with Dr. Delrae Alfred in 2 weeks--depression, htn, sinusitis Prescriptions: AZITHROMYCIN 250 MG TABS (AZITHROMYCIN) 2 tabs by mouth today, then 1 tab by mouth daily for 4 more days  #6 tabs x 0   Entered and Authorized by:   Julieanne Manson MD   Signed by:   Julieanne Manson MD on 06/07/2009   Method used:   Electronically to        [facility] (retail)       [address]       [place], [place]  [address]       Ph: [phone]       Fax: [phone]   RxID:   [id number] AMLODIPINE BESYLATE 5 MG TABS (AMLODIPINE BESYLATE) 1 tab by mouth daily  #30 x 11   Entered and Authorized by:   Julieanne Manson MD   Signed by:  Julieanne Manson MD on 06/07/2009   Method used:   Electronically to        [facility] (retail)       [address]       [place], [place]  [address]       Ph: [phone]       Fax: [phone]   RxID:   [id number] REMERON 30 MG TABS (MIRTAZAPINE) 1 tab by mouth at bedtime  #30 x 2   Entered and Authorized by:   Julieanne Manson MD   Signed by:   Julieanne Manson MD on 06/07/2009   Method used:   Electronically to        [facility] (retail)       [address]       [place], [place]  [id number]       Ph: [phone]       Fax: [phone]   RxID:   [id number]

## 2010-02-13 NOTE — Assessment & Plan Note (Signed)
Summary: CPP/////KT   Vital Signs:  Patient profile:   50 year old female Menstrual status:  DEPO LMP:     11/12/2008 Weight:      133 pounds BMI:     22.91 Temp:     97.8 degrees F Pulse rate:   82 / minute Pulse rhythm:   regular Resp:     26 per minute BP sitting:   157 / 99  (right arm) Cuff size:   regular  Vitals Entered By: Chauncy Passy, SMA CC: Pt. is here for a 50 y/o CPP. Pt. is concerned about her sinuses.  Is Patient Diabetic? No Pain Assessment Patient in pain? no       Does patient need assistance? Functional Status Self care Ambulation Normal LMP (date): 11/12/2008     Enter LMP: 11/12/2008 Last PAP Result NEGATIVE FOR INTRAEPITHELIAL LESIONS OR MALIGNANCY.   CC:  Pt. is here for a 50 y/o CPP. Pt. is concerned about her sinuses. .  History of Present Illness: 50 yo female here for CPP.  Concerns:  1.  Sinuses plugged for 2 weeks.  Sometimes when blows nose--scant amt of blood on tissue.  Mucous is clear.  Lot of sneezing, posterior pharyngeal drainage, throat itches.  Eyes also are watery and itchy.  Has this every year in spring.  Has tried Zyrtec and Claritin D without any improvement. Took Claritin D yesterday.   Did have Nasonex and Flonase, which helped more, but ran out of both.  Has not tried any other otc meds--though not sure if she has taken Allegra in the past.  2.  HIV: Continues to follow with Dr. Maurice March.  Recent labs from March in chart.  Taking meds regularly.  3.  Hypertension:  Has been taking Claritin D as above.  Not missing bp meds.  4.  Microhematuria:  never followed up as recommended after treatment last spring for trichomonas.  She states partner was treated as well.  They did have a discussion regarding the STD.  Still together.  Blood Noted again today on UA.  Still feels she has to urinate frequently.  No dysuria.  No vaginal discharge.   5.  Asthma:  coughs at night daily.  Using rescue inhaler two times a day.  Has never  been on anything other than rescue inhaler for asthma.  Does have also a history of smoking.  Started age 50 and quit age 45.  1 ppd.  Habits & Providers  Alcohol-Tobacco-Diet     Alcohol drinks/day: 0     Tobacco Status: quit > 6 months     Cigarette Packs/Day: 1.0     Year Started: age 2     Year Quit: age 48 per patient  Exercise-Depression-Behavior     Drug Use: never  Current Medications (verified): 1)  Atripla 600-200-300 Mg Tabs (Efavirenz-Emtricitab-Tenofovir) .... Take 1 Tablet By Mouth Once A Day 2)  Depo-Provera 150 Mg/ml Im Susp (Medroxyprogesterone Acetate) .... Inject Im Q 3 Months--To Bring To Clinic For Injection 3)  Metronidazole 500 Mg Tabs (Metronidazole) .... 4 Tabs By Mouth For 1 Dose. 4)  Calcium-Carb 600 + D 600-125 Mg-Unit Tabs (Calcium-Vitamin D) .Marland Kitchen.. 1 Tab By Mouth Two Times A Day 5)  Lotensin Hct 20-12.5 Mg Tabs (Benazepril-Hydrochlorothiazide) .... One Tab By Mouth Daily 6)  Proair Hfa 108 (90 Base) Mcg/act Aers (Albuterol Sulfate) .... 2 Puffs Two Times A Day As Needed  Allergies (verified): 1)  ! * Dapsone 2)  ! * Abacavir  3)  Pcn  Past History:  Past Medical History: HEMATURIA UNSPECIFIED (ICD-599.70) MICROSCOPIC HEMATURIA (ICD-599.72) HEMOCCULT POSITIVE STOOL (ICD-578.1) MENOPAUSAL SYNDROME (ICD-627.2) HYPERLIPIDEMIA (ICD-272.4) TRICHOMONAL VAGINITIS (ICD-131.01) ROUTINE GYNECOLOGICAL EXAMINATION (ICD-V72.31) THYROMEGALY (ICD-240.9) HEALTH MAINTENANCE EXAM (ICD-V70.0) CONTRACEPTIVE MANAGEMENT (ICD-V25.09) PERIPHERAL NEUROPATHY, IDIOPATHIC (ICD-356.9) HYPERTENSION (ICD-401.9) HIV DISEASE (ICD-042) DEPRESSION (ICD-311) ASTHMA (ICD-493.90)  Past Surgical History: Reviewed history from 05/03/2008 and no changes required. None  Family History: Reviewed history from 05/03/2008 and no changes required. Mother, died age 45:  Diabetic coma Father, died age 86:  housefire 5 siblings:  one sister with hypertension, DM.  Rest she believes are  healthy Daughter, 22:  healthy, overweight.  Social History: Reviewed history from 05/03/2008 and no changes required. 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 Lives at home with husband.  Smoking Status:  quit > 6 months Packs/Day:  1.0 Drug Use:  never  Review of Systems General:  Energy pretty good. Eyes:  Denies blurring. ENT:  Denies decreased hearing. CV:  Denies chest pain or discomfort and palpitations. Resp:  Complains of shortness of breath. GI:  Complains of diarrhea; denies bloody stools, constipation, and dark tarry stools; Bilateral lower quadrant pain intermittently--when bends over gets a "knot" in right lower quadrant at times.  Diarrhea last few days.  No nausea or vomiting.. GU:  See HPI. MS:  Denies joint pain, joint redness, and joint swelling. Derm:  Denies lesion(s) and rash. Neuro:  Denies numbness, tingling, and weakness. Psych:  Depressed at times.  PHQ-9 = 15.  Physical Exam  General:  Well-developed,well-nourished,in no acute distress; alert,appropriate and cooperative throughout examination Head:  Normocephalic and atraumatic without obvious abnormalities. No apparent alopecia or balding. Eyes:  No corneal or conjunctival inflammation noted. EOMI. Perrla. Funduscopic exam benign, without hemorrhages, exudates or papilledema. Vision grossly normal. Ears:  External ear exam shows no significant lesions or deformities.  Otoscopic examination reveals clear canals, tympanic membranes are intact bilaterally without bulging, retraction, inflammation or discharge. Hearing is grossly normal bilaterally. Nose:  nasal dischargemucosal pallor and mucosal edema.   Mouth:  pharynx pink and moist and teeth missing.  upper dentures. Neck:  No deformities, masses, or tenderness noted. Breasts:  No mass, nodules, thickening, tenderness, bulging, retraction, inflamation, nipple discharge or skin changes noted.   Lungs:  Normal  respiratory effort, chest expands symmetrically. Lungs are clear to auscultation, no crackles or wheezes. Heart:  Normal rate and regular rhythm. S1 and S2 normal without gallop, murmur, click, rub or other extra sounds. Abdomen:  Bowel sounds positive,abdomen soft and non-tender without masses, organomegaly or hernias noted. Rectal:  No external abnormalities noted. Normal sphincter tone. No rectal masses or tenderness.  Heme negative light brown stool. Genitalia:  Pelvic Exam:        External: normal female genitalia without lesions or masses        Vagina: normal without lesions or masses.  Mild white discharge--no odor.        Cervix: normal without lesions or masses        Adnexa: normal bimanual exam without masses or fullness        Uterus: normal by palpation        Pap smear: performed Msk:  No deformity or scoliosis noted of thoracic or lumbar spine.   Pulses:  R and L carotid,radial,femoral,dorsalis pedis and posterior tibial pulses are full and equal bilaterally Extremities:  No clubbing, cyanosis, edema, or deformity noted with normal full range of motion of all joints.   Neurologic:  No cranial nerve deficits noted. Station and gait are normal. Plantar reflexes are down-going bilaterally. DTRs are symmetrical throughout. Sensory, motor and coordinative functions appear intact. Skin:  Intact without suspicious lesions or rashes Cervical Nodes:  No lymphadenopathy noted Axillary Nodes:  No palpable lymphadenopathy Inguinal Nodes:  No significant adenopathy Psych:  Cognition and judgment appear intact. Alert and cooperative with normal attention span and concentration. No apparent delusions, illusions, hallucinations   Impression & Recommendations:  Problem # 1:  ROUTINE GYNECOLOGICAL EXAMINATION (ICD-V72.31) Mammogram scheduled Orders: T-Pap Smear, Thin Prep (29562) T- GC Chlamydia (13086) T-Syphilis Test (RPR) [phone]) KOH/ WET Mount [phone]) Pap Smear, Thin Prep (  Collection of) (K4401)  Problem # 2:  MICROSCOPIC HEMATURIA (ICD-599.72) Continues Orders: T- * Misc. Laboratory test [phone]) UA Dipstick w/o Micro (manual) (36644) T-Urinalysis ([id number])  Problem # 3:  MENOPAUSAL SYNDROME (ICD-627.2) Would like to stop Depo provera with osteopenia--check FSH to see if definitively menopausal Generally, no uterine bleeding with depo for this pt. Still with hot flashes. Orders: T-FSH ([id number])  Problem # 4:  HYPERTENSION (ICD-401.9) BP up, but has been taking decongestant--recheck in 1 month Her updated medication list for this problem includes:    Lotensin Hct 20-12.5 Mg Tabs (Benazepril-hydrochlorothiazide) ..... One tab by mouth daily  Problem # 5:  DEPRESSION (ICD-311) Start Zoloft Her updated medication list for this problem includes:    Zoloft 50 Mg Tabs (Sertraline hcl) .Marland Kitchen... 1.2 tab by mouth daily for 7 days, then increase to 1 tab by mouth daily  Orders: Psychology Referral (Psychology)  [facility]  Problem # 6:  ALKALINE PHOSPHATASE, ELEVATED (ICD-790.5) Noted on labs over past year. Orders: T-Hepatic Function [phone]) T- * Misc. Laboratory test ([phone]) GGTP  Problem # 7:  ALLERGIC RHINITIS (ICD-477.9) Will see if getting this under control helps with asthma--if not, add Singulair on follow up Her updated medication list for this problem includes:    Cetirizine Hcl 10 Mg Tabs (Cetirizine hcl) .Marland Kitchen... 1 tab by mouth daily    Fluticasone Propionate 50 Mcg/act Susp (Fluticasone propionate) .Marland Kitchen... 2 sprays each nostril daily  Problem # 8:  HEALTH MAINTENANCE EXAM (ICD-V70.0) Hemoccult cards x3 to return in 2 weeks.  Complete Medication List: 1)  Atripla 600-200-300 Mg Tabs (Efavirenz-emtricitab-tenofovir) .... Take 1 tablet by mouth once a day 2)  Depo-provera 150 Mg/ml Im Susp (Medroxyprogesterone acetate) .... Inject im q 3 months--to bring to clinic for injection 3)  Calcium-carb 600 + D 600-125 Mg-unit Tabs  (Calcium-vitamin d) .Marland Kitchen.. 1 tab by mouth two times a day 4)  Lotensin Hct 20-12.5 Mg Tabs (Benazepril-hydrochlorothiazide) .... One tab by mouth daily 5)  Proair Hfa 108 (90 Base) Mcg/act Aers (Albuterol sulfate) .... 2 puffs two times a day as needed 6)  Zoloft 50 Mg Tabs (Sertraline hcl) .... 1.2 tab by mouth daily for 7 days, then increase to 1 tab by mouth daily 7)  Cetirizine Hcl 10 Mg Tabs (Cetirizine hcl) .Marland Kitchen.. 1 tab by mouth daily 8)  Fluticasone Propionate 50 Mcg/act Susp (Fluticasone propionate) .... 2 sprays each nostril daily  Other Orders: T-Lipid Profile ([id number])  Patient Instructions: 1)  Referral to [facility] for counseling. 2)  Follow up with Dr. Delrae Alfred in 1 month -depression Prescriptions: FLUTICASONE PROPIONATE 50 MCG/ACT SUSP (FLUTICASONE PROPIONATE) 2 sprays each nostril daily  #1 x 11   Entered and Authorized by:   Julieanne Manson MD   Signed by:   Julieanne Manson MD on 05/09/2009   Method used:   Electronically  to        [facility] (retail)       [address]       [place], [place]  [id number]       Ph: [phone]       Fax: [phone]   RxID:   [id number] CETIRIZINE HCL 10 MG TABS (CETIRIZINE HCL) 1 tab by mouth daily  #30 x 11   Entered and Authorized by:   Julieanne Manson MD   Signed by:   Julieanne Manson MD on 05/09/2009   Method used:   Electronically to        [facility] (retail)       [address]       [place], [place]  [id number]       Ph: [phone]       Fax: [phone]   RxID:   [id number] LOTENSIN HCT 20-12.5 MG TABS (BENAZEPRIL-HYDROCHLOROTHIAZIDE) one tab by mouth daily  #30 x 11   Entered and Authorized by:   Julieanne Manson MD   Signed by:   Julieanne Manson MD on 05/09/2009   Method used:   Electronically to        [facility] (retail)       [address]       [place], [place]  [id number]       Ph: [phone]        Fax: [phone]   RxID:   [id number] DEPO-PROVERA 150 MG/ML IM SUSP (MEDROXYPROGESTERONE ACETATE) Inject IM q 3 months--to bring to clinic for injection  #1 x 1   Entered and Authorized by:   Julieanne Manson MD   Signed by:   Julieanne Manson MD on 05/09/2009   Method used:   Electronically to        [facility] (retail)       [address]       [place], [place]  [id number]       Ph: [phone]       Fax: [phone]   RxID:   [id number] ZOLOFT 50 MG TABS (SERTRALINE HCL) 1.2 tab by mouth daily for 7 days, then increase to 1 tab by mouth daily  #30 x 2   Entered and Authorized by:   Julieanne Manson MD   Signed by:   Julieanne Manson MD on 05/09/2009   Method used:   Electronically to        [facility] (retail)       [address]       [place], [place]  [id number]       Ph: [phone]       Fax: [phone]   RxID:   [id number]    Preventive Care Screening  Prior Values:    PPD:  Negative (03/08/2006)    Pap Smear:  NEGATIVE FOR INTRAEPITHELIAL LESIONS OR MALIGNANCY. (05/03/2008)    Mammogram:  Normal (05/10/2008)    Bone Density:  T score  -1.2 bilateral femur:  Osteopenia. (05/10/2008)    Last Tetanus Booster:  Historical (11/12/2004)    Last Flu Shot:  Fluvax 3+ (11/09/2008)    Last Pneumovax:  Pneumovax (05/03/2008)    Dexa Interp:  T score  -1.2 bilateral femur:  Osteopenia. (05/10/2008)  Laboratory Results   Urine Tests    Routine Urinalysis   Color: yellow Appearance: Clear Glucose: negative   (Normal Range: Negative) Bilirubin: negative   (Normal Range: Negative)  Ketone: negative   (Normal Range: Negative) Spec. Gravity: 1.025   (Normal Range: 1.003-1.035) Blood: moderate   (Normal Range: Negative) pH: 5.5   (Normal Range: 5.0-8.0) Protein: 100   (Normal Range: Negative) Urobilinogen: 0.2   (Normal Range: 0-1) Nitrite: negative   (Normal Range: Negative) Leukocyte  Esterace: negative   (Normal Range: Negative)      Wet Mount Source: vaginal WBC/hpf: 1-5 Bacteria/hpf: 1+ Clue cells/hpf: none  Negative whiff Yeast/hpf: none Wet Mount KOH: Negative Trichomonas/hpf: none   Laboratory Results   Urine Tests    Routine Urinalysis   Color: yellow Appearance: Clear Glucose: negative   (Normal Range: Negative) Bilirubin: negative   (Normal Range: Negative) Ketone: negative   (Normal Range: Negative) Spec. Gravity: 1.025   (Normal Range: 1.003-1.035) Blood: moderate   (Normal Range: Negative) pH: 5.5   (Normal Range: 5.0-8.0) Protein: 100   (Normal Range: Negative) Urobilinogen: 0.2   (Normal Range: 0-1) Nitrite: negative   (Normal Range: Negative) Leukocyte Esterace: negative   (Normal Range: Negative)      Wet Mount/KOH  Negative whiff
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
## 2010-02-13 NOTE — Miscellaneous (Signed)
  Clinical Lists Changes  Problems: Changed problem from ALKALINE PHOSPHATASE, ELEVATED (ICD-790.5) to ALKALINE PHOSPHATASE, ELEVATED (ICD-790.5) - GGTP elevated. Abdominal ultrasound normal--no gallstones or liver lesions. Changed problem from DYSFUNCTIONAL UTERINE BLEEDING (ICD-626.8) to DYSFUNCTIONAL UTERINE BLEEDING (ICD-626.8) - uterine fibroids on ultrasound Added new problem of FIBROIDS, UTERUS (ICD-218.9)

## 2010-02-13 NOTE — Letter (Signed)
Summary: *HSN Results Follow up  [facility]  [address] [place], [place] [address]   Phone: [phone]  Fax: [phone]      06/19/2009   [facility] [address] [place], [place]  [address]   Dear  Ms. Molly Dillon,                            ____S.Drinkard,FNP   ____D. Gore,FNP       ____B. McPherson,MD   ____V. Rankins,MD    _X___E. Anabel Lykins,MD    ____N. Daphine Deutscher, FNP  ____D. Reche Dixon, MD    ____K. Philipp Deputy, MD    ____Other     This letter is to inform you that your recent test(s):  _______Pap Smear    _______Lab Test     ___X____X-ray    _______ is within acceptable limits  _______ requires a medication change  _______ requires a follow-up lab visit  _______ requires a follow-up visit with your provider   Comments:  Your abdominal ultrasound was fine--no evidence of gallbladder problems or liver problems.   Your pelvic ultrasound showed that you have fibroids in your uterus--these are growths in the wall of the uterus that are NOT cancerous and can sometimes cause bleeding.  The depo may help shrink these over time--let me know if your bleeding continues to be a problem.       _________________________________________________________ If you have any questions, please contact our office                     Sincerely,  Julieanne Manson MD [facility]

## 2010-02-13 NOTE — Letter (Signed)
Summary: REFERRAL//PSYCHOLOGY//APPT DATE & TIME  REFERRAL//PSYCHOLOGY//APPT DATE & TIME   Imported By: Arta Bruce 07/03/2009 15:09:14  _____________________________________________________________________  External Attachment:    Type:   Image     Comment:   External Document
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
## 2010-02-13 NOTE — Letter (Signed)
Summary: TEST ORDER FORM//ULTRASOUND//APPT DATE & TIME  TEST ORDER FORM//[facility]TRASOUND//APPT DATE & TIME   Imported By: Arta Bruce 06/19/2009 09:42:42  _____________________________________________________________________  External Attachment:    Type:   Image     Comment:   External Document

## 2010-02-13 NOTE — Miscellaneous (Signed)
  Clinical Lists Changes  Observations: Added new observation of YEARAIDSPOS: 2003  (12/03/2009 16:07) Added new observation of HIV STATUS: CDC-defined AIDS  (12/03/2009 16:07)

## 2010-02-13 NOTE — Progress Notes (Signed)
Summary: Office Visit//DEPRESSION SCREENING  Office Visit//DEPRESSION SCREENING   Imported By: Arta Bruce 07/23/2009 12:43:46  _____________________________________________________________________  External Attachment:    Type:   Image     Comment:   External Document

## 2010-02-13 NOTE — Miscellaneous (Signed)
Summary: Orders Update  Clinical Lists Changes  Orders: Added new Test order of T-CD4SP [facility]) (CD4SP) - Signed Added new Test order of T-CBC w/Diff [phone]) - Signed Added new Test order of T-Comprehensive Metabolic Panel [phone]) - Signed Added new Test order of T-HIV Viral Load [phone]) - Signed     Process Orders Check Orders Results:     [facility]: Check successful Order queued for requisitioning for [facility]: Jun 06, 2009 10:22 AM  Tests Sent for requisitioning (Jun 06, 2009 10:22 AM):     09/30/2009: [facility] -- T-CBC w/Diff [[id number]] (signed)     09/30/2009: [facility] -- T-Comprehensive Metabolic Panel [[id number]] (signed)     09/30/2009: [facility] -- T-HIV Viral Load [phone] (signed)

## 2010-02-13 NOTE — Progress Notes (Signed)
Summary: NEED DEPO CALLED IN//APPT TODAY  Phone Note Call from Patient   Summary of Call: NEED DEPO CALLED IN TO [facility] [address] HAS APPT TODAY @ 2:30 Initial call taken by: Arta Bruce,  March 04, 2009 8:47 AM  Follow-up for Phone Call        Sent in on 12/13/08 with a refill. Follow-up by: Vesta Mixer CMA,  March 04, 2009 11:25 AM

## 2010-02-13 NOTE — Assessment & Plan Note (Signed)
Summary: 2 week fu/depression/htn/sinsitis///kt   Vital Signs:  Patient profile:   50 year old female Menstrual status:  DEPO Weight:      136.1 pounds BSA:     1.66 Temp:     97.8 degrees F oral Pulse rate:   90 / minute Pulse rhythm:   regular Resp:     24 per minute BP sitting:   154 / 99  (left arm) Cuff size:   regular  Vitals Entered By: Gaylyn Cheers RN (June 21, 2009 11:02 AM) CC: Here for F/U HTN took med 1 hr. ago, states she is feeling better since she started on med for depression, has slight congestion Is Patient Diabetic? No Pain Assessment Patient in pain? no       Does patient need assistance? Functional Status Self care Ambulation Normal Comments did not bring meds   CC:  Here for F/U HTN took med 1 hr. ago, states she is feeling better since she started on med for depression, and has slight congestion.  History of Present Illness: 1.  Depression/insomnia/ poor appetite:  Sleeping 6 hours nightly, appetite markedly improved.  Feels rested, with great energy and looks forward to day.  Feels like she is still able to grieve for her niece.  Has a new grandson, and attention to him has helped.  Has a good support network.  2.  Hypertension:  Tolerating addition of Amlodipine.  No swelling of LE.  Has been taking med for about 12 days now.    3.  Uterine bleeding:  not as much now, but still spotting daily.  On Depo provera.  Ultrasound showed 2 fibroids.  Discussed could be from fibroids and/or depo.  Depo can help shrink the fibroids, however.  Willing to continue depo for now.   4.  Elevated alk phos and GGTP with some RUQ discomfort.  The abdominal discomfort resolved at this time.  Ultrasound did not show a liver or gallbladder problem.  Allergies (verified): 1)  ! * Dapsone 2)  ! * Abacavir 3)  Pcn  Physical Exam  General:  NAD, happy Nose:  clear discharge Lungs:  Normal respiratory effort, chest expands symmetrically. Lungs are clear to  auscultation, no crackles or wheezes. Heart:  Normal rate and regular rhythm. S1 and S2 normal without gallop, murmur, click, rub or other extra sounds.  Radial pulses normal and equal Abdomen:  Bowel sounds positive,abdomen soft and non-tender without masses, organomegaly or hernias noted.   Impression & Recommendations:  Problem # 1:  DYSFUNCTIONAL UTERINE BLEEDING (ICD-626.8) See HPI. Pt. to continue Depo provera, but call if bleeding persists.  Problem # 2:  ALKALINE PHOSPHATASE, ELEVATED (ICD-790.5) Follow No evidence of gallbladder or liver disease  Problem # 3:  HYPERTENSION (ICD-401.9) Mildly improved. No further med change for now--increase Amlodipine if still elevated with next follow up Her updated medication list for this problem includes:    Lotensin Hct 20-12.5 Mg Tabs (Benazepril-hydrochlorothiazide) ..... One tab by mouth daily    Amlodipine Besylate 5 Mg Tabs (Amlodipine besylate) .Marland Kitchen... 1 tab by mouth daily  Problem # 4:  DEPRESSION (ICD-311) Much improved. Need to follow  weight closely--pt. with poor diet and discussed curtailing bad snacking significantly Her updated medication list for this problem includes:    Remeron 30 Mg Tabs (Mirtazapine) .Marland Kitchen... 1 tab by mouth at bedtime  Complete Medication List: 1)  Atripla 600-200-300 Mg Tabs (Efavirenz-emtricitab-tenofovir) .... Take 1 tablet by mouth once a day 2)  Depo-provera 150 Mg/ml  Im Susp (Medroxyprogesterone acetate) .... Inject im q 3 months--to bring to clinic for injection 3)  Calcium-carb 600 + D 600-125 Mg-unit Tabs (Calcium-vitamin d) .Marland Kitchen.. 1 tab by mouth two times a day 4)  Lotensin Hct 20-12.5 Mg Tabs (Benazepril-hydrochlorothiazide) .... One tab by mouth daily 5)  Proair Hfa 108 (90 Base) Mcg/act Aers (Albuterol sulfate) .... 2 puffs two times a day as needed 6)  Remeron 30 Mg Tabs (Mirtazapine) .Marland Kitchen.. 1 tab by mouth at bedtime 7)  Cetirizine Hcl 10 Mg Tabs (Cetirizine hcl) .Marland Kitchen.. 1 tab by mouth daily 8)   Fluticasone Propionate 50 Mcg/act Susp (Fluticasone propionate) .... 2 sprays each nostril daily 9)  Amlodipine Besylate 5 Mg Tabs (Amlodipine besylate) .Marland Kitchen.. 1 tab by mouth daily 10)  Azithromycin 250 Mg Tabs (Azithromycin) .... 2 tabs by mouth today, then 1 tab by mouth daily for 4 more days  Patient Instructions: 1)  Follow up with Dr. Delrae Alfred in 6 weeks  for weight, depression and bp

## 2010-02-13 NOTE — Progress Notes (Signed)
Summary: Office Visit//DEPRESSION SCREENING  Office Visit//DEPRESSION SCREENING   Imported By: Arta Bruce 07/03/2009 15:11:51  _____________________________________________________________________  External Attachment:    Type:   Image     Comment:   External Document

## 2010-02-13 NOTE — Assessment & Plan Note (Signed)
Summary: 6 WEEK////gk   Vital Signs:  Patient profile:   50 year old female Menstrual status:  DEPO Weight:      142 pounds Temp:     97.5 degrees F Pulse rate:   95 / minute Pulse rhythm:   regular Resp:     20 per minute BP sitting:   126 / 93  (left arm) Cuff size:   regular  Vitals Entered By: Vesta Mixer CMA (August 02, 2009 2:50 PM) CC: 6 week f/u weight, bp and depression Is Patient Diabetic? No  Does patient need assistance? Ambulation Normal   CC:  6 week f/u weight and bp and depression.  History of Present Illness: 1.  Hypertension:  Taking bp meds regularly.  Is exercising as well.  2.  Depression:  Sleeping 8 hours per night.  Great energy in morning.  Looking forward to day.  Trying to avoid snacking.  Eating more vegetables.  Pt. states she feels best at 150 lbs, but is only 64 inches tall.  AGain, feels she is still able to mourn--does not feel "flat" on Remeron.  3.  Uterine Bleeding:  stopped.  No problems currently.    Allergies (verified): 1)  ! * Dapsone 2)  ! * Abacavir 3)  Pcn  Physical Exam  General:  Happy, NAD Lungs:  Normal respiratory effort, chest expands symmetrically. Lungs are clear to auscultation, no crackles or wheezes. Heart:  Normal rate and regular rhythm. S1 and S2 normal without gallop, murmur, click, rub or other extra sounds.  Radial pulses normal and equal   Impression & Recommendations:  Problem # 1:  DEPRESSION (ICD-311) Well controlled Her updated medication list for this problem includes:    Remeron 30 Mg Tabs (Mirtazapine) .Marland Kitchen... 1 tab by mouth at bedtime  Problem # 2:  DYSFUNCTIONAL UTERINE BLEEDING (ICD-626.8) Resolved  Problem # 3:  HYPERTENSION (ICD-401.9) Not at goal--increase Amlodipine Her updated medication list for this problem includes:    Lotensin Hct 20-12.5 Mg Tabs (Benazepril-hydrochlorothiazide) ..... One tab by mouth daily    Amlodipine Besylate 10 Mg Tabs (Amlodipine besylate) .Marland Kitchen... 1 tab by  mouth daily for blood pressure  Complete Medication List: 1)  Atripla 600-200-300 Mg Tabs (Efavirenz-emtricitab-tenofovir) .... Take 1 tablet by mouth once a day 2)  Depo-provera 150 Mg/ml Im Susp (Medroxyprogesterone acetate) .... Inject im q 3 months--to bring to clinic for injection 3)  Calcium-carb 600 + D 600-125 Mg-unit Tabs (Calcium-vitamin d) .Marland Kitchen.. 1 tab by mouth two times a day 4)  Lotensin Hct 20-12.5 Mg Tabs (Benazepril-hydrochlorothiazide) .... One tab by mouth daily 5)  Proair Hfa 108 (90 Base) Mcg/act Aers (Albuterol sulfate) .... 2 puffs two times a day as needed 6)  Remeron 30 Mg Tabs (Mirtazapine) .Marland Kitchen.. 1 tab by mouth at bedtime 7)  Cetirizine Hcl 10 Mg Tabs (Cetirizine hcl) .Marland Kitchen.. 1 tab by mouth daily 8)  Fluticasone Propionate 50 Mcg/act Susp (Fluticasone propionate) .... 2 sprays each nostril daily 9)  Amlodipine Besylate 10 Mg Tabs (Amlodipine besylate) .Marland Kitchen.. 1 tab by mouth daily for blood pressure  Patient Instructions: 1)  Nurse visit for bp check in 2 weeks--please get a weight then as well. 2)  Follow up with Dr. Delrae Alfred in 3 months --depression Prescriptions: AMLODIPINE BESYLATE 10 MG TABS (AMLODIPINE BESYLATE) 1 tab by mouth daily for blood pressure  #30 x 11   Entered and Authorized by:   Julieanne Manson MD   Signed by:   Julieanne Manson MD on 08/02/2009  Method used:   Electronically to        [facility] (retail)       [address]       [place], [place]  [id number]       Ph: [phone]       Fax: [phone]   RxID:   [id number]

## 2010-02-13 NOTE — Miscellaneous (Signed)
Summary: Appointment Canceled  Appointment status changed to canceled by [employer] on 04/16/2009 3:36 PM.  Cancellation Comments --------------------- lab [mkj]  Appointment Information ----------------------- Appt Type:  LAB NO DOCUMENT      Date:  Monday, May 27, 2009      Time:  9:30 AM for 30 min   Urgency:  Routine   Made By:  Pearson Grippe  To Visit:  [id number]    Reason:  lab [mkj]  Appt Comments ------------- -- 04/16/09 15:36: ([facility]) CANCELED -- lab [mkj] -- 04/16/09 15:33: ([facility]) BOOKED -- Routine LAB NO DOCUMENT at 05/27/2009 9:30 AM for 30 min lab [mkj]

## 2010-02-21 ENCOUNTER — Encounter (INDEPENDENT_AMBULATORY_CARE_PROVIDER_SITE_OTHER): Payer: Self-pay | Admitting: Internal Medicine

## 2010-02-27 NOTE — Letter (Signed)
Summary: MAILED REQUESTED RECORDS TO [facility]  MAILED REQUESTED RECORDS TO [facility]   Imported By: Arta Bruce 02/21/2010 16:37:38  _____________________________________________________________________  External Attachment:    Type:   Image     Comment:   External Document

## 2010-03-20 NOTE — Assessment & Plan Note (Signed)
Summary: F/U OV   CC:  follow-up visit.  Preventive Screening-Counseling & Management  Alcohol-Tobacco     Alcohol drinks/day: 0     Smoking Status: quit > 6 months     Packs/Day: 1.0     Year Started: age 50     Year Quit: age 41 per patient     Pack years: 1 1/2 ppd for 26 years     Passive Smoke Exposure: no  Caffeine-Diet-Exercise     Caffeine use/day: 0     Does Patient Exercise: yes     Type of exercise: walking     Exercise (avg: min/session): 30-60     Times/week: 5  Hep-HIV-STD-Contraception     HIV Risk: no risk noted     HIV Risk Counseling: not indicated-no HIV risk noted  Safety-Violence-Falls     Seat Belt Use: yes  Comments: given condoms      Sexual History:  currently monogamous.        Drug Use:  never.     Current Allergies (reviewed today): ! * DAPSONE ! * ABACAVIR PCN Social History: Sexual History:  currently monogamous  Vital Signs:  Patient profile:   50 year old female Menstrual status:  DEPO Height:      64 inches (162.56 cm) Weight:      149.75 pounds (68.07 kg) BMI:     25.80 Temp:     97.8 degrees F (36.56 degrees C) oral Pulse rate:   88 / minute BP sitting:   138 / 89  (left arm) Cuff size:   large  Vitals Entered By: Jennet Maduro RN (November 14, 2009 3:32 PM)  Nutrition Counseling: Patient's BMI is greater than 25 and therefore counseled on weight management options. CC: follow-up visit Is Patient Diabetic? No Pain Assessment Patient in pain? no      Nutritional Status BMI of 19 -24 = normal Nutritional Status Detail appetite "good"  Have you ever been in a relationship where you felt threatened, hurt or afraid?No   Does patient need assistance? Functional Status Self care Ambulation Normal Comments no missed doses of rxes    Other Orders: Est. Patient Level IV (40981) T-CBC w/Diff [phone]) T-CD4SP [facility]) (CD4SP) T-Comprehensive Metabolic Panel [id number]) T-HIV Viral Load  ([phone]) T-Lipid Profile [phone]) T-RPR (Syphilis) ([id number])  Patient Instructions: 1)  Please schedule a follow-up appointment in 5-6 months. 2)  Be sure to return for lab work one (1) week before your next appointment as scheduled.  Process Orders Check Orders Results:     [facility]: Check successful Tests Sent for requisitioning (March 11, 2010 10:49 AM):     11/14/2009: [facility] -- T-CBC w/Diff [[id number]] (signed)     11/14/2009: [facility] -- T-Comprehensive Metabolic Panel [[id number]] (signed)     11/14/2009: [facility] -- T-HIV Viral Load [id number] (signed)     11/14/2009: [facility] -- T-Lipid Profile [id number] (signed)     11/14/2009: [facility] -- T-RPR (Syphilis) [phone] (signed)          Medication Adherence: 11/14/2009   Adherence to medications reviewed with patient. Counseling to provide adequate adherence provided    Prevention For Positives: 11/14/2009   Safe sex practices discussed with patient. Condoms offered.        11/14/2009   Patient was screened for substance abuse and depression. Referal was made as indicated.

## 2010-03-26 LAB — T-HELPER CELL (CD4) - (RCID CLINIC ONLY): CD4 % Helper T Cell: 17 % — ABNORMAL LOW (ref 33–55)

## 2010-04-04 LAB — T-HELPER CELL (CD4) - (RCID CLINIC ONLY): CD4 T Cell Abs: 380 uL — ABNORMAL LOW (ref 400–2700)

## 2010-04-23 LAB — DIFFERENTIAL
Basophils Absolute: 0 10*3/uL (ref 0.0–0.1)
Eosinophils Relative: 2 % (ref 0–5)
Lymphocytes Relative: 48 % — ABNORMAL HIGH (ref 12–46)
Neutro Abs: 2.6 10*3/uL (ref 1.7–7.7)

## 2010-04-23 LAB — CBC
RBC: 4.29 MIL/uL (ref 3.87–5.11)
WBC: 6.3 10*3/uL (ref 4.0–10.5)

## 2010-04-23 LAB — T-HELPER CELL (CD4) - (RCID CLINIC ONLY)
CD4 % Helper T Cell: 16 % — ABNORMAL LOW (ref 33–55)
CD4 T Cell Abs: 450 uL (ref 400–2700)

## 2010-05-08 ENCOUNTER — Other Ambulatory Visit (INDEPENDENT_AMBULATORY_CARE_PROVIDER_SITE_OTHER): Payer: Medicaid Other

## 2010-05-08 DIAGNOSIS — B20 Human immunodeficiency virus [HIV] disease: Secondary | ICD-10-CM

## 2010-05-08 DIAGNOSIS — Z79899 Other long term (current) drug therapy: Secondary | ICD-10-CM

## 2010-05-08 DIAGNOSIS — Z113 Encounter for screening for infections with a predominantly sexual mode of transmission: Secondary | ICD-10-CM

## 2010-05-08 LAB — LIPID PANEL
HDL: 59 mg/dL (ref >39)
Total CHOL/HDL Ratio: 3.6 Ratio
Triglycerides: 135 mg/dL (ref <150)

## 2010-05-08 LAB — CBC WITH DIFFERENTIAL/PLATELET
Basophils Absolute: 0.1 10*3/uL (ref 0.0–0.1)
HCT: 40.1 % (ref 36.0–46.0)
Hemoglobin: 13.4 g/dL (ref 12.0–15.0)
Lymphocytes Relative: 41 % (ref 12–46)
Monocytes Absolute: 0.6 10*3/uL (ref 0.1–1.0)
Neutro Abs: 3.2 10*3/uL (ref 1.7–7.7)
Neutrophils Relative %: 48 % (ref 43–77)
RDW: 15.1 % (ref 11.5–15.5)
WBC: 6.7 10*3/uL (ref 4.0–10.5)

## 2010-05-09 LAB — COMPLETE METABOLIC PANEL WITH GFR
ALT: 20 U/L (ref 0–35)
AST: 19 U/L (ref 0–37)
Albumin: 3.9 g/dL (ref 3.5–5.2)
BUN: 16 mg/dL (ref 6–23)
Calcium: 9.1 mg/dL (ref 8.4–10.5)
Chloride: 108 mEq/L (ref 96–112)
Potassium: 4.3 mEq/L (ref 3.5–5.3)
Sodium: 142 mEq/L (ref 135–145)
Total Protein: 7.4 g/dL (ref 6.0–8.3)

## 2010-05-10 LAB — HIV-1 RNA QUANT-NO REFLEX-BLD
HIV 1 RNA Quant: <20 copies/mL (ref <20)
HIV-1 RNA Quant, Log: <1.3 {Log} (ref <1.30)

## 2010-05-22 ENCOUNTER — Ambulatory Visit: Payer: Self-pay | Admitting: Infectious Diseases

## 2010-05-30 NOTE — Discharge Summary (Signed)
[place]  Patient:    Molly Dillon, Molly Dillon                        MRN: [id number] Adm. Date:  04/01/00 Disc. Date: 04/04/00 Attending:  Fransisco Hertz, M.D. Dictator:   Bernerd Pho, M.D. CC:         Marcos Eke. Hal Hope, M.D. (FAX: [phone]) [facility]                           Discharge Summary  DISCHARGE DIAGNOSES: 1. PCP pneumonia. 2. HIV.  DISCHARGE MEDICATIONS: 1. Septra DS 2 tablets p.o. q.8h. x 18 days. 2. Prednisone taper 40 mg p.o. b.i.d. x 2 days then 40 mg p.o. q.d. x 5 days    and 20 mg p.o. q.d. x 11 days.  FOLLOWUP:  The patient is instructed to follow up with her primary care physician within 1-2 weeks of discharge.  CONSULTATIONS:  None.  PROCEDURES:  None.  CHIEF COMPLAINT:  Shortness of breath.  HISTORY OF PRESENT ILLNESS:  The patient is a 50 year old African-American female with a history of HIV diagnosed in 1993 last CD4 count of 360 and viral load greater than 75,000 done in March 2002 who presents with a 3 week history of progressive dyspnea, progressive cough of yellowish brown sputum.  The patient denies any recent history of fevers, but does acknowledge a few episodes of chills and a couple episodes of night sweats.  The patient denies any hemoptysis, occasionally has a slight wheeze.  The patient denies any sick contact.  The patient complains of occasional chest pain with coughing.  The patients primary complaint is shortness of breath with exertion.  The patient acknowledges about a 5-6 pound weight loss over the last 3 weeks.  The patient initially saw her primary care physician on March 22, 2000, and was treated for community-acquired pneumonia with Moxifloxacin.  The patient states she took that antibiotic for roughly 1-2 days.  The patient chest x-ray revealed interstitial infiltrates and so the patient was started on PCP treatment, mainly Bactrim, 2 tablets p.o. q.i.d.  The patient  states she took the Bactrim for roughly 2 to 2-1/2 days prior to admission.  The patient presented on the day of admission to her primary care physician with oxygen saturation of 89% on room air and progressive dyspnea. Therefore the patient was admitted to [facility] for further management. The patient denies any history of PCP, pneumonia or any other HIV related illnesses.  PAST MEDICAL HISTORY: 1. HIV diagnosed 16.  Patient believes she contracted HIV through sexual    intercourse with her husband.  The patient was on antiretrovirals for    roughly 18 months but stopped in 1998 secondary to intolerance.  The    patient is intolerance to DDI and Sustiva and had a hypersensitivity    reaction to Ziagen.  The denies any HIV related illnesses or any    hospitalizations due to HIV. 2. Hepatitis C antibody positive. 3. Allergic rhinitis. 4. History of CIN 3 with conization in July of 1996.  MEDICATIONS ON ADMISSION: 1. Bactrim DS 2 tablets p.o. q.i.d. 2. allegra 60 mg p.o. b.i.d. p.r.n. 3. flonase p.r.n.  ALLERGIES:  ZIAGEN (hypersensitivity reaction) and PENICILLIN (hives).  SOCIAL HISTORY:  The patient is married with 1 daughter who is 53 years old. The patientients husband is HIV positive and the daughter is HIV negative.  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  The patient acknowledges smoking about 1-1/2 packs per day and has about a 30 pack year smoking history. The patient denies any alcohol or drug use.  The patient plays bingo for fun.  FAMILY HISTORY:  Hypertension, diabetes.  PHYSICAL EXAMINATION:  VITAL SIGNS:  Temperature 98.2, blood pressure 126/76, heart rate 110, respiratory rate 18, saturation on room air 92%.  GENERAL:  The patient is a pleasant, middle aged, African-American female in no apparent distress.  HEENT:  Sclerae, conjunctivae, cornea clear.  Normocephalic and atraumatic. The patient has a few hyperpigmented  lesions in the oropharynx with a few candidal lesions.  NECK:  Supple, no lymphadenopathy except for tiny nontender lymph nodes.  CARDIOVASCULAR:  Regular rhythm with slight tachycardia.  PULMONARY:  Bibasilar crackles but otherwise good air movements.  No rhonchi, no wheezes.  ABDOMEN:  Soft, nontender, nondistended.  Positive bowel sounds.  EXTREMITIES:  No clubbing, cyanosis, or edema.  NEUROLOGIC:  Nonfocal.  ADMISSION LABORATORIES:  White count 8.7, hemoglobin 12.2, platelets 337, ANC 4.9, sodium 132, potassium 3.9, chloride 105, bicarbonate 22, BUN 14, creatinine 1, glucose 90, ASC 26, ALT 20, alk. phos. 152, total bilirubin 0.3. Albumin 2.7, calcium 8.7, LDH 242.  The patients admission chest x-ray showed marked interstitial lung disease which was thought to be a combination of chronic interstitial lung disease and superimposed interstitial pneumonitis.  PCP infection was considered a possibility.  HOSPITAL COURSE: #1 - PCP PNEUMONIA:  The patient was admitted and initially placed under      respiratory isolation since TB was in the differential diagnosis.  The      patient had a thorough workup as an outpatient and respiratory isolation      was discontinued once the records of the outside work-up were obtained.      On March 30, 2000, the patient had an induced sputum sent for DFA, PCP      analysis for AFB smear and for routine culture and gram stain.  The      patients work-up was remarkable for the DFA, PCP, being positive and the      AFB smear being negative.  The patients sputum culture was normal      oropharyngeal flora.  The patient had a CT scan performed on March 29, 2000, as an outpatient which showed extensive bilateral pneumonia air      space disease.  Predominant but an interstitial component was noted      as well.  There was no definite hilar or mediastinal adenopathy.  The CT      was suggestive of PCP but the findings were nonspecific and  other      opportunistic infections could not be excluded based on the CT.        Based on the patients outpatient CT and the DFA for PCP being positive      the patient was aggressively treated for PCP pneumonia.  The patient had      an ABG done on room air which showed a degree of hypoxia.  The patients      ABG on room air was 7.44/35/52/24 with an A-A gradient of 53.  Based on      the hypoxia the patient was started on a prednisone taper.  She was also      placed on 02 nasal cannula at 2 l/min and at saturations greater than  92%.  The patient was initially managed with IV Septra and by hospital      day #2 the patient had improved and was switched to oral Septra.  The      patient remained on 02 nasal cannula but this was discontinued on      hospital day #2.  Cryptococcal antigen was tested as a possible      coinfection and was negative.  The patient was discharged on a prednisone      taper and Septra DS to be taken for the next 18 days.  The patient should      be on PCP prophylaxis after she finishes her ______ dose course of      Septra.  The patient during this admission mentioned interest in resuming      antiretroviral therapy.  It is suggested that the patient be placed on      the simplest regimen and the patient can be placed on tenosovir 300 mg      p.o. q.d. with either the combination of liponavir and ritonovir 6      tablets p.o. q.d.  The other alternative the patient be placed on      tenosovir 300 mg p.o. q.d. with viraamune 400 mg p.o. q.d.  A CD4 count      was ordered during this admission since it is very unlikely for a patient      with a CD4 count of 360 to present with PCP pneumonia but since the      patient was admitted on the weekend this laboratory was not performed.      The patients case was discussed with the primary care Kareem Aul and again      we are grateful for her thorough work-up as an outpatient by her primary      care  Mahlik Lenn.  DISCHARGE LABORATORY DATA:  Sodium 132, potassium 4.5, chloride 102, bicarb 23, BUN 14, creatinine 0.9, glucose 133, white count 9.8, hemoglobin 13.8, platelets 446 with an ANC of 7.0.  DISPOSITION:  The patient is being discharged to home.  RESIDENT PHYSICIAN:  Karlene Einstein, M.D. and Bernerd Pho, M.D. DD:  04/06/00 TD:  04/06/00 Job: [id number]

## 2010-08-18 ENCOUNTER — Other Ambulatory Visit: Payer: Self-pay | Admitting: Infectious Diseases

## 2010-08-18 DIAGNOSIS — B20 Human immunodeficiency virus [HIV] disease: Secondary | ICD-10-CM

## 2010-10-07 LAB — T-HELPER CELL (CD4) - (RCID CLINIC ONLY): CD4 T Cell Abs: 360 — ABNORMAL LOW

## 2010-10-28 LAB — T-HELPER CELL (CD4) - (RCID CLINIC ONLY)
CD4 % Helper T Cell: 11 — ABNORMAL LOW
CD4 T Cell Abs: 290 — ABNORMAL LOW

## 2010-10-30 ENCOUNTER — Other Ambulatory Visit (INDEPENDENT_AMBULATORY_CARE_PROVIDER_SITE_OTHER): Payer: Medicaid Other

## 2010-10-30 DIAGNOSIS — B20 Human immunodeficiency virus [HIV] disease: Secondary | ICD-10-CM

## 2010-10-30 LAB — CBC WITH DIFFERENTIAL/PLATELET
Hemoglobin: 13.7 g/dL (ref 12.0–15.0)
Lymphocytes Relative: 36 % (ref 12–46)
Lymphs Abs: 2.3 10*3/uL (ref 0.7–4.0)
MCH: 31.9 pg (ref 26.0–34.0)
Monocytes Relative: 9 % (ref 3–12)
Neutro Abs: 3.3 10*3/uL (ref 1.7–7.7)
Neutrophils Relative %: 52 % (ref 43–77)
RBC: 4.3 MIL/uL (ref 3.87–5.11)
WBC: 6.2 10*3/uL (ref 4.0–10.5)

## 2010-10-30 LAB — COMPLETE METABOLIC PANEL WITH GFR
ALT: 20 U/L (ref 0–35)
AST: 20 U/L (ref 0–37)
Albumin: 4.1 g/dL (ref 3.5–5.2)
Alkaline Phosphatase: 247 U/L — ABNORMAL HIGH (ref 39–117)
Calcium: 8.8 mg/dL (ref 8.4–10.5)
Chloride: 105 mEq/L (ref 96–112)
Potassium: 3.9 mEq/L (ref 3.5–5.3)
Sodium: 140 mEq/L (ref 135–145)

## 2010-10-31 LAB — HIV-1 RNA QUANT-NO REFLEX-BLD
HIV 1 RNA Quant: <20 copies/mL (ref <20)
HIV-1 RNA Quant, Log: <1.3 {Log} (ref <1.30)

## 2010-11-20 ENCOUNTER — Ambulatory Visit (INDEPENDENT_AMBULATORY_CARE_PROVIDER_SITE_OTHER): Payer: Medicaid Other | Admitting: Infectious Diseases

## 2010-11-20 ENCOUNTER — Encounter: Payer: Self-pay | Admitting: Infectious Diseases

## 2010-11-20 VITALS — BP 141/100 | HR 101 | Temp 97.6°F | Wt 159.2 lb

## 2010-11-20 DIAGNOSIS — F411 Generalized anxiety disorder: Secondary | ICD-10-CM

## 2010-11-20 DIAGNOSIS — B20 Human immunodeficiency virus [HIV] disease: Secondary | ICD-10-CM

## 2010-11-20 DIAGNOSIS — F419 Anxiety disorder, unspecified: Secondary | ICD-10-CM

## 2010-11-20 DIAGNOSIS — Z23 Encounter for immunization: Secondary | ICD-10-CM

## 2010-11-20 MED ORDER — MIRTAZAPINE 30 MG PO TABS: 30.0000 mg | ORAL_TABLET | Freq: Every day | ORAL | Status: DC

## 2010-11-20 NOTE — Patient Instructions (Signed)
Please return in 5-6 months

## 2010-11-20 NOTE — Progress Notes (Signed)
  Subjective:    Patient ID: Molly Dillon, female    DOB: 06/20/1960, 50 y.o.   MRN: [id number]  HPIsheri has been adherent to her ARVs and reflected in HIV<20 copies and CD4 410. She has a new complaint of menorrhagia for 4 weeks with daily spotting. She has no vaginal pain or fevers. Will refer to [facility] hospital if bleeding persists for 2 more weeks. Yuridia will call in to inform us.    Review of Systems  Constitutional: Negative.   HENT: Negative.   Eyes: Negative.   Respiratory: Negative.   Cardiovascular: Negative.   Genitourinary: Positive for vaginal bleeding and menstrual problem. Negative for urgency, decreased urine volume, vaginal discharge, vaginal pain and pelvic pain.       See HPI for vaginal bleeding history       Objective:   Physical Exam  Constitutional: She appears well-developed.  HENT:  Head: Normocephalic.  Mouth/Throat: No oropharyngeal exudate.  Eyes: Conjunctivae and EOM are normal. Pupils are equal, round, and reactive to light.  Neck: Normal range of motion.  Cardiovascular: Normal rate, regular rhythm and normal heart sounds.   Abdominal: She exhibits no distension and no mass. There is no tenderness. There is no rebound and no guarding.  Lymphadenopathy:    She has no cervical adenopathy.          Assessment & Plan:

## 2011-01-28 ENCOUNTER — Telehealth: Payer: Self-pay | Admitting: *Deleted

## 2011-01-28 ENCOUNTER — Other Ambulatory Visit: Payer: Self-pay | Admitting: *Deleted

## 2011-01-28 NOTE — Telephone Encounter (Signed)
States she has been out of her bp meds for a while. I asked her to call [facility] or her pharmacy to get them filled. Explained we handle infectious disease. Please use her pcp for all other issues

## 2011-01-28 NOTE — Telephone Encounter (Signed)
Pt requested medications that had previously been rxed by a provider at [facility].  The pt was advised to obtain appt at [facility] to continue to obtain care and prescriptions.  Pt verbalized understanding and stated that she would call for an appt.

## 2011-03-05 DIAGNOSIS — F329 Major depressive disorder, single episode, unspecified: Secondary | ICD-10-CM | POA: Diagnosis not present

## 2011-03-05 DIAGNOSIS — I10 Essential (primary) hypertension: Secondary | ICD-10-CM | POA: Diagnosis not present

## 2011-03-05 DIAGNOSIS — M2550 Pain in unspecified joint: Secondary | ICD-10-CM | POA: Diagnosis not present

## 2011-03-26 ENCOUNTER — Other Ambulatory Visit: Payer: Self-pay | Admitting: Infectious Diseases

## 2011-04-13 DIAGNOSIS — R69 Illness, unspecified: Secondary | ICD-10-CM | POA: Diagnosis not present

## 2011-04-23 DIAGNOSIS — Z634 Disappearance and death of family member: Secondary | ICD-10-CM | POA: Diagnosis not present

## 2011-08-07 ENCOUNTER — Other Ambulatory Visit: Payer: Self-pay | Admitting: Infectious Diseases

## 2011-08-07 DIAGNOSIS — Z113 Encounter for screening for infections with a predominantly sexual mode of transmission: Secondary | ICD-10-CM

## 2011-08-07 DIAGNOSIS — Z79899 Other long term (current) drug therapy: Secondary | ICD-10-CM

## 2011-08-07 DIAGNOSIS — B20 Human immunodeficiency virus [HIV] disease: Secondary | ICD-10-CM

## 2011-08-13 ENCOUNTER — Other Ambulatory Visit: Payer: Medicare Other

## 2011-08-13 DIAGNOSIS — Z113 Encounter for screening for infections with a predominantly sexual mode of transmission: Secondary | ICD-10-CM | POA: Diagnosis not present

## 2011-08-13 DIAGNOSIS — Z79899 Other long term (current) drug therapy: Secondary | ICD-10-CM | POA: Diagnosis not present

## 2011-08-13 DIAGNOSIS — B20 Human immunodeficiency virus [HIV] disease: Secondary | ICD-10-CM

## 2011-08-13 LAB — CBC WITH DIFFERENTIAL/PLATELET
Basophils Absolute: 0.1 10*3/uL (ref 0.0–0.1)
Eosinophils Relative: 1 % (ref 0–5)
HCT: 40.4 % (ref 36.0–46.0)
Lymphocytes Relative: 37 % (ref 12–46)
Lymphs Abs: 2.6 10*3/uL (ref 0.7–4.0)
MCV: 93.5 fL (ref 78.0–100.0)
Monocytes Absolute: 0.6 10*3/uL (ref 0.1–1.0)
Neutro Abs: 3.7 10*3/uL (ref 1.7–7.7)
Platelets: 323 10*3/uL (ref 150–400)
RBC: 4.32 MIL/uL (ref 3.87–5.11)
RDW: 15.2 % (ref 11.5–15.5)
WBC: 7.1 10*3/uL (ref 4.0–10.5)

## 2011-08-13 LAB — COMPREHENSIVE METABOLIC PANEL
ALT: 28 U/L (ref 0–35)
AST: 32 U/L (ref 0–37)
Albumin: 4 g/dL (ref 3.5–5.2)
CO2: 24 mEq/L (ref 19–32)
Calcium: 9.5 mg/dL (ref 8.4–10.5)
Chloride: 102 mEq/L (ref 96–112)
Creat: 0.91 mg/dL (ref 0.50–1.10)
Potassium: 4.3 mEq/L (ref 3.5–5.3)

## 2011-08-13 LAB — LIPID PANEL
HDL: 66 mg/dL (ref >39)
LDL Cholesterol: 146 mg/dL — ABNORMAL HIGH (ref 0–99)

## 2011-08-13 LAB — RPR

## 2011-08-14 LAB — T-HELPER CELL (CD4) - (RCID CLINIC ONLY)
CD4 % Helper T Cell: 17 % — ABNORMAL LOW (ref 33–55)
CD4 T Cell Abs: 450 uL (ref 400–2700)

## 2011-08-17 LAB — HIV-1 RNA QUANT-NO REFLEX-BLD: HIV-1 RNA Quant, Log: <1.3 {Log} (ref <1.30)

## 2011-08-28 ENCOUNTER — Ambulatory Visit (INDEPENDENT_AMBULATORY_CARE_PROVIDER_SITE_OTHER): Payer: Medicare Other | Admitting: Infectious Diseases

## 2011-08-28 ENCOUNTER — Encounter: Payer: Self-pay | Admitting: Infectious Diseases

## 2011-08-28 VITALS — BP 145/97 | HR 87 | Temp 97.4°F | Ht 64.0 in | Wt 149.5 lb

## 2011-08-28 DIAGNOSIS — B20 Human immunodeficiency virus [HIV] disease: Secondary | ICD-10-CM

## 2011-08-28 MED ORDER — EFAVIRENZ-EMTRICITAB-TENOFOVIR 600-200-300 MG PO TABS: 1.0000 | ORAL_TABLET | Freq: Every day | ORAL | Status: DC

## 2011-08-28 NOTE — Patient Instructions (Signed)
Please return in 4 mos with labs 2 weeks prior

## 2011-08-28 NOTE — Progress Notes (Signed)
Patient ID: Molly Dillon, female   DOB: 14-Apr-1960, 51 y.o.   MRN: [id number] HIV f/u [name] has no complaints and adheres to her Atripla. She has undectable HIV over past 2 years now and CD4 of 480. She has no fevers or weight loss or diarrhea. Exam WN WD and in no distress. No rashes, adenopathy or thrush.  Impression/plan HIV-doing very well and continue ARVs and f/u in 4 months Preventive care updated. Lina Sayre

## 2011-09-11 ENCOUNTER — Other Ambulatory Visit (HOSPITAL_COMMUNITY)
Admission: RE | Admit: 2011-09-11 | Discharge: 2011-09-11 | Disposition: A | Discharge status: Home / Self Care | Payer: Medicare Other | Source: Ambulatory Visit | Attending: Internal Medicine | Admitting: Internal Medicine

## 2011-09-11 ENCOUNTER — Ambulatory Visit (INDEPENDENT_AMBULATORY_CARE_PROVIDER_SITE_OTHER): Payer: Medicare Other | Admitting: *Deleted

## 2011-09-11 DIAGNOSIS — Z0000 Encounter for general adult medical examination without abnormal findings: Secondary | ICD-10-CM

## 2011-09-11 DIAGNOSIS — Z124 Encounter for screening for malignant neoplasm of cervix: Secondary | ICD-10-CM

## 2011-09-11 DIAGNOSIS — F411 Generalized anxiety disorder: Secondary | ICD-10-CM

## 2011-09-11 DIAGNOSIS — Z1231 Encounter for screening mammogram for malignant neoplasm of breast: Secondary | ICD-10-CM

## 2011-09-11 DIAGNOSIS — F419 Anxiety disorder, unspecified: Secondary | ICD-10-CM

## 2011-09-11 MED ORDER — MIRTAZAPINE 30 MG PO TABS: 30.0000 mg | ORAL_TABLET | Freq: Every day | ORAL | Status: DC

## 2011-09-11 NOTE — Patient Instructions (Signed)
Your results will be ready in about a week.  I will mail them to you.  Thank you for coming to the Center for your care.  Sumiko Ceasar,  RN 

## 2011-09-11 NOTE — Progress Notes (Signed)
  Subjective:     Molly Dillon is a 51 y.o. woman who comes in today for a  pap smear only.  Previous abnormal Pap smears: yes. Contraception: menopause, condoms  Objective:    LMP 01/12/2011 Pelvic Exam: Pap smear obtained.   Assessment:    Screening pap smear.   Plan:    Follow up in one year, or as indicated by Pap results.  Pt given educational materials re: HIV and women, BSE, PAP smears,, heart disease and HIV, self-esteem and partner safety. Pt given condoms.

## 2011-09-18 ENCOUNTER — Encounter: Payer: Self-pay | Admitting: *Deleted

## 2011-10-05 ENCOUNTER — Ambulatory Visit (HOSPITAL_COMMUNITY): Admission: RE | Admit: 2011-10-05 | Payer: Medicare Other | Source: Ambulatory Visit

## 2011-12-17 ENCOUNTER — Other Ambulatory Visit: Payer: Medicare Other

## 2011-12-17 DIAGNOSIS — B20 Human immunodeficiency virus [HIV] disease: Secondary | ICD-10-CM | POA: Diagnosis not present

## 2011-12-17 LAB — CBC WITH DIFFERENTIAL/PLATELET
Basophils Absolute: 0.1 10*3/uL (ref 0.0–0.1)
Basophils Relative: 1 % (ref 0–1)
Eosinophils Relative: 2 % (ref 0–5)
Hemoglobin: 11.9 g/dL — ABNORMAL LOW (ref 12.0–15.0)
MCH: 32.2 pg (ref 26.0–34.0)
Monocytes Absolute: 0.7 10*3/uL (ref 0.1–1.0)
Neutro Abs: 4.2 10*3/uL (ref 1.7–7.7)
Neutrophils Relative %: 51 % (ref 43–77)
Platelets: 357 10*3/uL (ref 150–400)
RBC: 3.7 MIL/uL — ABNORMAL LOW (ref 3.87–5.11)
WBC: 8.2 10*3/uL (ref 4.0–10.5)

## 2011-12-17 LAB — COMPREHENSIVE METABOLIC PANEL
AST: 20 U/L (ref 0–37)
Alkaline Phosphatase: 232 U/L — ABNORMAL HIGH (ref 39–117)
BUN: 20 mg/dL (ref 6–23)
Calcium: 8.6 mg/dL (ref 8.4–10.5)
Chloride: 108 mEq/L (ref 96–112)
Creat: 0.91 mg/dL (ref 0.50–1.10)

## 2011-12-18 LAB — T-HELPER CELL (CD4) - (RCID CLINIC ONLY)
CD4 % Helper T Cell: 20 % — ABNORMAL LOW (ref 33–55)
CD4 T Cell Abs: 600 uL (ref 400–2700)

## 2011-12-18 LAB — HIV-1 RNA QUANT-NO REFLEX-BLD: HIV-1 RNA Quant, Log: <1.3 {Log} (ref <1.30)

## 2012-01-01 ENCOUNTER — Ambulatory Visit (INDEPENDENT_AMBULATORY_CARE_PROVIDER_SITE_OTHER): Payer: Medicare Other | Admitting: Infectious Diseases

## 2012-01-01 ENCOUNTER — Encounter: Payer: Self-pay | Admitting: Infectious Diseases

## 2012-01-01 VITALS — BP 142/99 | HR 82 | Temp 97.6°F | Ht 64.0 in | Wt 150.0 lb

## 2012-01-01 DIAGNOSIS — B20 Human immunodeficiency virus [HIV] disease: Secondary | ICD-10-CM

## 2012-01-01 DIAGNOSIS — F411 Generalized anxiety disorder: Secondary | ICD-10-CM | POA: Diagnosis not present

## 2012-01-01 DIAGNOSIS — J3489 Other specified disorders of nose and nasal sinuses: Secondary | ICD-10-CM

## 2012-01-01 DIAGNOSIS — F419 Anxiety disorder, unspecified: Secondary | ICD-10-CM

## 2012-01-01 DIAGNOSIS — Z23 Encounter for immunization: Secondary | ICD-10-CM

## 2012-01-01 DIAGNOSIS — R0981 Nasal congestion: Secondary | ICD-10-CM

## 2012-01-01 MED ORDER — MIRTAZAPINE 30 MG PO TABS: 30.0000 mg | ORAL_TABLET | Freq: Every day | ORAL | Status: DC

## 2012-01-01 MED ORDER — SALINE NASAL SPRAY 0.65 % NA SOLN: 1.0000 | NASAL | Status: DC | PRN

## 2012-01-01 NOTE — Patient Instructions (Signed)
Please follow up in 4 months labs 2 weeks prior

## 2012-01-01 NOTE — Progress Notes (Signed)
Patient ID: Molly Dillon, female   DOB: 20-Jun-1960, 51 y.o.   MRN: [id number] HIV f/u Note 01/01/12    Molly Dillon has been doing well and adherent to Atripla with over a year of HIV undectability. Shows in CD4 count of 600, her highest. BP is also pretty good on benazapril/hctz and amlodopine. She has no complaints. Exam BP 142/99  Pulse 82  Temp 97.6 F (36.4 C) (Oral)  Ht 5\' 4"  (1.626 m)  Wt 150 lb (68.04 kg)  BMI 25.75 kg/m2  LMP 01/12/2011 Molly Dillon is WNWD and without any rashes, adenopathy, or oral mucosal lesions  IMP and Plans  HIV continue Atripla and f/u labs and visit with me in 4 months. HTN nearly controlled and reminded her of adherence. Overall doing very well. Lina Sayre

## 2012-01-09 ENCOUNTER — Other Ambulatory Visit: Payer: Self-pay | Admitting: Infectious Diseases

## 2012-04-21 ENCOUNTER — Other Ambulatory Visit: Payer: Self-pay | Admitting: Internal Medicine

## 2012-04-21 ENCOUNTER — Other Ambulatory Visit: Payer: Medicare Other

## 2012-04-21 DIAGNOSIS — Z1231 Encounter for screening mammogram for malignant neoplasm of breast: Secondary | ICD-10-CM

## 2012-04-21 DIAGNOSIS — B20 Human immunodeficiency virus [HIV] disease: Secondary | ICD-10-CM | POA: Diagnosis not present

## 2012-04-21 LAB — COMPREHENSIVE METABOLIC PANEL
Albumin: 3.8 g/dL (ref 3.5–5.2)
Alkaline Phosphatase: 275 U/L — ABNORMAL HIGH (ref 39–117)
CO2: 23 mEq/L (ref 19–32)
Glucose, Bld: 107 mg/dL — ABNORMAL HIGH (ref 70–99)
Potassium: 4.5 mEq/L (ref 3.5–5.3)
Sodium: 141 mEq/L (ref 135–145)
Total Protein: 7 g/dL (ref 6.0–8.3)

## 2012-04-21 LAB — CBC WITH DIFFERENTIAL/PLATELET
Hemoglobin: 13.2 g/dL (ref 12.0–15.0)
Lymphocytes Relative: 36 % (ref 12–46)
Lymphs Abs: 2.3 10*3/uL (ref 0.7–4.0)
Monocytes Relative: 6 % (ref 3–12)
Neutro Abs: 3.7 10*3/uL (ref 1.7–7.7)
Neutrophils Relative %: 55 % (ref 43–77)
RBC: 4.16 MIL/uL (ref 3.87–5.11)

## 2012-04-23 LAB — HIV-1 RNA QUANT-NO REFLEX-BLD
HIV 1 RNA Quant: <20 copies/mL (ref <20)
HIV-1 RNA Quant, Log: <1.3 {Log} (ref <1.30)

## 2012-05-06 ENCOUNTER — Ambulatory Visit (INDEPENDENT_AMBULATORY_CARE_PROVIDER_SITE_OTHER): Payer: Medicare Other | Admitting: Infectious Diseases

## 2012-05-06 ENCOUNTER — Encounter: Payer: Self-pay | Admitting: Infectious Diseases

## 2012-05-06 VITALS — BP 156/101 | HR 91 | Temp 97.4°F | Wt 153.0 lb

## 2012-05-06 DIAGNOSIS — F411 Generalized anxiety disorder: Secondary | ICD-10-CM

## 2012-05-06 DIAGNOSIS — J45909 Unspecified asthma, uncomplicated: Secondary | ICD-10-CM | POA: Diagnosis not present

## 2012-05-06 DIAGNOSIS — Z0000 Encounter for general adult medical examination without abnormal findings: Secondary | ICD-10-CM | POA: Diagnosis not present

## 2012-05-06 DIAGNOSIS — F419 Anxiety disorder, unspecified: Secondary | ICD-10-CM

## 2012-05-06 DIAGNOSIS — I10 Essential (primary) hypertension: Secondary | ICD-10-CM | POA: Diagnosis not present

## 2012-05-06 DIAGNOSIS — B20 Human immunodeficiency virus [HIV] disease: Secondary | ICD-10-CM

## 2012-05-06 MED ORDER — EFAVIRENZ-EMTRICITAB-TENOFOVIR 600-200-300 MG PO TABS: 1.0000 | ORAL_TABLET | Freq: Every day | ORAL | Status: DC

## 2012-05-06 MED ORDER — AMLODIPINE BESYLATE 10 MG PO TABS: 10.0000 mg | ORAL_TABLET | Freq: Every day | ORAL | Status: DC

## 2012-05-06 MED ORDER — BECLOMETHASONE DIPROPIONATE 40 MCG/ACT IN AERS: 2.0000 | INHALATION_SPRAY | Freq: Every day | RESPIRATORY_TRACT | Status: DC

## 2012-05-06 MED ORDER — MIRTAZAPINE 30 MG PO TABS: 30.0000 mg | ORAL_TABLET | Freq: Every day | ORAL | Status: DC

## 2012-05-06 MED ORDER — BENAZEPRIL-HYDROCHLOROTHIAZIDE 20-12.5 MG PO TABS: 1.0000 | ORAL_TABLET | Freq: Every day | ORAL | Status: DC

## 2012-05-06 NOTE — Addendum Note (Signed)
Addended by: Jennet Maduro D on: 05/06/2012 03:19 PM   Modules accepted: Orders

## 2012-05-06 NOTE — Progress Notes (Signed)
Patient ID: Molly Dillon, female   DOB: [birth year], 52 y.o.   MRN: [id number] HIV f/u  Molly Dillon has been adherent to her HIV regimen and he HIV is suppressed and CD4 is stable. She has not had CD4 counts rebound >500 but has had good viral suppression. She has congestive cough for past months and say it happens with each pollen season. Further probing reveals history compatible with chronic asthma with night time cough >5 times per week and intermittent wheezing episodes. She has been told intermittantly in past that she has asthma but is not on her problem list. We will add asthma to her problem list and prescribe long acting agent, Qvar combination with 2 puffs daily. She has had no fevers or weight loss. Molly Dillon has mild BP elevation and encouraged better adherence to her BP meds. Should be noted at next visit.  BP 156/101  Pulse 91  Temp(Src) 97.4 F (36.3 C) (Oral)  Wt 153 lb (69.4 kg)  BMI 26.25 kg/m2  LMP 01/12/2011 Lungs are clear to auscultation. I&P Controlled and suppressed HIV. Will continue Atripla and Dr. Judyann Munson will followup her HIV care when I fully retire this coming June. HTN See above New Problem Probale ashtma by current and past symptoms Begin dual inhaler Rx with Qvar Will refer to [facility] for primary care. BP 156/101  Pulse 91  Temp(Src) 97.4 F (36.3 C) (Oral)  Wt 153 lb (69.4 kg)  BMI 26.25 kg/m2  LMP 01/12/2011      Lina Sayre

## 2012-05-06 NOTE — Patient Instructions (Signed)
Keep up the good work and return in 4 months to see Dr Daiva Eves as discussed during visit.

## 2012-05-12 ENCOUNTER — Encounter: Payer: Self-pay | Admitting: *Deleted

## 2012-06-21 ENCOUNTER — Encounter: Payer: Medicare Other | Admitting: Internal Medicine

## 2012-07-12 ENCOUNTER — Encounter: Payer: Self-pay | Admitting: Internal Medicine

## 2012-07-12 ENCOUNTER — Ambulatory Visit (INDEPENDENT_AMBULATORY_CARE_PROVIDER_SITE_OTHER): Payer: Medicare Other | Admitting: Internal Medicine

## 2012-07-12 VITALS — BP 108/75 | HR 100 | Temp 98.0°F | Resp 20 | Ht 64.5 in | Wt 143.3 lb

## 2012-07-12 DIAGNOSIS — J45909 Unspecified asthma, uncomplicated: Secondary | ICD-10-CM

## 2012-07-12 DIAGNOSIS — R21 Rash and other nonspecific skin eruption: Secondary | ICD-10-CM

## 2012-07-12 DIAGNOSIS — I10 Essential (primary) hypertension: Secondary | ICD-10-CM

## 2012-07-12 DIAGNOSIS — J4550 Severe persistent asthma, uncomplicated: Secondary | ICD-10-CM

## 2012-07-12 DIAGNOSIS — Z0000 Encounter for general adult medical examination without abnormal findings: Secondary | ICD-10-CM

## 2012-07-12 DIAGNOSIS — F17200 Nicotine dependence, unspecified, uncomplicated: Secondary | ICD-10-CM

## 2012-07-12 MED ORDER — TIOTROPIUM BROMIDE MONOHYDRATE 18 MCG IN CAPS: 18.0000 ug | ORAL_CAPSULE | Freq: Every day | RESPIRATORY_TRACT | Status: DC

## 2012-07-12 MED ORDER — TRIAMCINOLONE ACETONIDE 0.1 % EX OINT: TOPICAL_OINTMENT | Freq: Two times a day (BID) | CUTANEOUS | Status: DC

## 2012-07-12 MED ORDER — ALBUTEROL SULFATE HFA 108 (90 BASE) MCG/ACT IN AERS: 2.0000 | INHALATION_SPRAY | Freq: Four times a day (QID) | RESPIRATORY_TRACT | Status: DC | PRN

## 2012-07-12 NOTE — Patient Instructions (Signed)
Ms List,  Please take the rescue inhaler when you have an asthma attack. Please continue with the steroid inhaler.  Your blood pressure is good. I have ordered a mammogram this visit.  I have ordered a cream for your rash. Lets meet each other in 3 months.    Thanks, Aletta Edouard MD MPH 07/12/2012 11:49 AM

## 2012-07-12 NOTE — Progress Notes (Addendum)
Subjective:     Patient ID: Molly Dillon, female   DOB: 1960/07/18, 52 y.o.   MRN: [id number]  HPI Ms Felipe Cabell is visiting me for the first time. She has been referred to me by Dr. Darlina Sicilian who is retiring at this time. Ms Lydon is being treated for HIV/AIDS, Hypertension, Asthma, and hyperlipidemia. She saw Dr. Maurice March for HIV but from now, she will be seeing Dr. Luciana Axe in [facility]. She is compliant with her medications, and she keeps excellent blood pressure control. She takes only one steroid inhaler for her asthma and she describes that she still has night time awakenings once or twice in 1-2 weeks. However, she is much better with the steroid inhaler. She reports no side effects of the inhaler so far. She does not take a rescue inhaler and does not even know what that is. She is an active smoker trying to quit.  She has a new rash comprising of small 29mm-5mm diameter nodules on her hands, which has arisen 1 week ago and is very itchy. She denies working outside, or using new creams. She does not have any other complaints at this time. She denies shortness of breath, chest pain, palpitations, diarrhea, constipation, vomiting, nausea, headache, visual changes, pain anywhere in her body at this visit.    Review of Systems As per HPI    Objective:   Physical Exam Vitals reviewed.  General: No acute distress, african [place] female HEENT: PERRL, EOMI, no scleral icterus. Heart: RRR, no rubs, murmurs or gallops. Lungs: Clear to auscultation bilaterally, no wheezes, rales, or rhonchi. Abdomen: Soft, nontender, nondistended, BS present. Extremities: Warm, no pedal edema. Hands have nodular rash on forearms and dorsum of palm. Scratch marks seen, erythema not apparent because of dark skin color  Neuro: Alert and oriented X3, cranial nerves II-XII grossly intact,  strength and sensation to light touch equal in bilateral upper and lower extremities     Assessment & Plan:     Skin rash -  Looks like contact dermatitis. Triamcinolone 0.1% oint prescribed.  Asthma, uncontrolled - Spiriva and Albuterol inhalers prescribed along with the steroid that she is already taking. Hypertension - On target blood pressure, continue current regimen. Smoking cessation counseling and information given.   Follow up in 3 months.

## 2012-08-12 ENCOUNTER — Ambulatory Visit (HOSPITAL_COMMUNITY)
Admission: RE | Admit: 2012-08-12 | Discharge: 2012-08-12 | Disposition: A | Discharge status: Home / Self Care | Payer: Medicare Other | Source: Ambulatory Visit | Attending: Internal Medicine | Admitting: Internal Medicine

## 2012-08-12 ENCOUNTER — Other Ambulatory Visit: Payer: Self-pay | Admitting: *Deleted

## 2012-08-12 DIAGNOSIS — Z1231 Encounter for screening mammogram for malignant neoplasm of breast: Secondary | ICD-10-CM

## 2012-08-12 DIAGNOSIS — Z0000 Encounter for general adult medical examination without abnormal findings: Secondary | ICD-10-CM

## 2012-08-12 MED ORDER — TRIAMCINOLONE ACETONIDE 0.1 % EX OINT: TOPICAL_OINTMENT | Freq: Two times a day (BID) | CUTANEOUS | Status: DC

## 2012-08-30 ENCOUNTER — Other Ambulatory Visit: Payer: Self-pay | Admitting: Licensed Clinical Social Worker

## 2012-08-30 DIAGNOSIS — I10 Essential (primary) hypertension: Secondary | ICD-10-CM

## 2012-08-30 MED ORDER — BENAZEPRIL-HYDROCHLOROTHIAZIDE 20-12.5 MG PO TABS: 1.0000 | ORAL_TABLET | Freq: Every day | ORAL | Status: DC

## 2012-08-31 ENCOUNTER — Other Ambulatory Visit: Payer: Medicare Other

## 2012-08-31 DIAGNOSIS — B20 Human immunodeficiency virus [HIV] disease: Secondary | ICD-10-CM

## 2012-08-31 LAB — COMPLETE METABOLIC PANEL WITH GFR
ALT: 17 U/L (ref 0–35)
AST: 19 U/L (ref 0–37)
CO2: 22 mEq/L (ref 19–32)
Calcium: 8.8 mg/dL (ref 8.4–10.5)
Chloride: 113 mEq/L — ABNORMAL HIGH (ref 96–112)
GFR, Est African American: 67 mL/min
Potassium: 5 mEq/L (ref 3.5–5.3)
Sodium: 141 mEq/L (ref 135–145)
Total Protein: 6.8 g/dL (ref 6.0–8.3)

## 2012-08-31 LAB — CBC WITH DIFFERENTIAL/PLATELET
Basophils Absolute: 0.1 10*3/uL (ref 0.0–0.1)
Lymphocytes Relative: 33 % (ref 12–46)
Lymphs Abs: 2.4 10*3/uL (ref 0.7–4.0)
MCV: 93.3 fL (ref 78.0–100.0)
Neutro Abs: 4.3 10*3/uL (ref 1.7–7.7)
Neutrophils Relative %: 57 % (ref 43–77)
Platelets: 356 10*3/uL (ref 150–400)
RBC: 3.3 MIL/uL — ABNORMAL LOW (ref 3.87–5.11)
WBC: 7.5 10*3/uL (ref 4.0–10.5)

## 2012-09-01 LAB — HIV-1 RNA QUANT-NO REFLEX-BLD
HIV 1 RNA Quant: <20 copies/mL (ref <20)
HIV-1 RNA Quant, Log: <1.3 {Log} (ref <1.30)

## 2012-09-14 ENCOUNTER — Encounter: Payer: Self-pay | Admitting: Infectious Disease

## 2012-09-14 ENCOUNTER — Ambulatory Visit (INDEPENDENT_AMBULATORY_CARE_PROVIDER_SITE_OTHER): Payer: Medicare Other | Admitting: Infectious Disease

## 2012-09-14 VITALS — BP 107/74 | HR 88 | Temp 97.9°F | Wt 137.0 lb

## 2012-09-14 DIAGNOSIS — Z113 Encounter for screening for infections with a predominantly sexual mode of transmission: Secondary | ICD-10-CM | POA: Diagnosis not present

## 2012-09-14 DIAGNOSIS — R634 Abnormal weight loss: Secondary | ICD-10-CM

## 2012-09-14 DIAGNOSIS — R6889 Other general symptoms and signs: Secondary | ICD-10-CM | POA: Insufficient documentation

## 2012-09-14 DIAGNOSIS — E785 Hyperlipidemia, unspecified: Secondary | ICD-10-CM

## 2012-09-14 DIAGNOSIS — B20 Human immunodeficiency virus [HIV] disease: Secondary | ICD-10-CM | POA: Diagnosis not present

## 2012-09-14 DIAGNOSIS — Z23 Encounter for immunization: Secondary | ICD-10-CM

## 2012-09-14 NOTE — Progress Notes (Signed)
  Subjective:    Patient ID: Molly Dillon, female    DOB: 10-03-1960, 52 y.o.   MRN: [id number]  HPI  52 year old [place] American lady with perfectly controlled HIV on atripla, also with asthma which is well controlled with concerns that she cannot gain back enough weight despite good appetitie.   Review of Systems  Constitutional: Negative for fever, chills, diaphoresis, activity change, appetite change, fatigue and unexpected weight change.  HENT: Negative for congestion, sore throat, rhinorrhea, sneezing, trouble swallowing and sinus pressure.   Eyes: Negative for photophobia and visual disturbance.  Respiratory: Negative for cough, chest tightness, shortness of breath, wheezing and stridor.   Cardiovascular: Negative for chest pain, palpitations and leg swelling.  Gastrointestinal: Negative for nausea, vomiting, abdominal pain, diarrhea, constipation, blood in stool, abdominal distention and anal bleeding.  Genitourinary: Negative for dysuria, hematuria, flank pain and difficulty urinating.  Musculoskeletal: Negative for myalgias, back pain, joint swelling, arthralgias and gait problem.  Skin: Negative for color change, pallor, rash and wound.  Neurological: Negative for dizziness, tremors, weakness and light-headedness.  Hematological: Negative for adenopathy. Does not bruise/bleed easily.  Psychiatric/Behavioral: Negative for behavioral problems, confusion, sleep disturbance, dysphoric mood, decreased concentration and agitation.       Objective:   Physical Exam  Constitutional: She is oriented to person, place, and time. She appears well-developed and well-nourished. No distress.  HENT:  Head: Normocephalic and atraumatic.  Mouth/Throat: Oropharynx is clear and moist. No oropharyngeal exudate.  Eyes: Conjunctivae and EOM are normal. Pupils are equal, round, and reactive to light. No scleral icterus.  Neck: Normal range of motion. Neck supple. No JVD present.  Cardiovascular:  Normal rate, regular rhythm and normal heart sounds.  Exam reveals no gallop and no friction rub.   No murmur heard. Pulmonary/Chest: Effort normal and breath sounds normal. No respiratory distress. She has no wheezes. She has no rales. She exhibits no tenderness.  Abdominal: She exhibits no distension and no mass. There is no tenderness. There is no rebound and no guarding.  Musculoskeletal: She exhibits no edema and no tenderness.  Lymphadenopathy:    She has no cervical adenopathy.  Neurological: She is alert and oriented to person, place, and time. She exhibits normal muscle tone. Coordination normal.  Skin: Skin is warm and dry. She is not diaphoretic. No erythema. No pallor.  Psychiatric: She has a normal mood and affect. Her behavior is normal. Judgment and thought content normal.          Assessment & Plan:  HIV: continue Atripla  HTN: well controlled, watch at home with BP cuff as well  Difficulty gaining weight: check TSH, cortisol

## 2012-09-15 LAB — CORTISOL: Cortisol, Plasma: 12.2 ug/dL

## 2012-10-11 ENCOUNTER — Other Ambulatory Visit: Payer: Self-pay | Admitting: Infectious Diseases

## 2012-10-11 DIAGNOSIS — F411 Generalized anxiety disorder: Secondary | ICD-10-CM

## 2013-03-13 ENCOUNTER — Other Ambulatory Visit (INDEPENDENT_AMBULATORY_CARE_PROVIDER_SITE_OTHER): Payer: Medicare Other

## 2013-03-13 DIAGNOSIS — E785 Hyperlipidemia, unspecified: Secondary | ICD-10-CM | POA: Diagnosis not present

## 2013-03-13 DIAGNOSIS — Z113 Encounter for screening for infections with a predominantly sexual mode of transmission: Secondary | ICD-10-CM | POA: Diagnosis not present

## 2013-03-13 DIAGNOSIS — B20 Human immunodeficiency virus [HIV] disease: Secondary | ICD-10-CM | POA: Diagnosis not present

## 2013-03-13 LAB — CBC WITH DIFFERENTIAL/PLATELET
BASOS PCT: 0 % (ref 0–1)
Basophils Absolute: 0 10*3/uL (ref 0.0–0.1)
EOS ABS: 0.1 10*3/uL (ref 0.0–0.7)
Eosinophils Relative: 1 % (ref 0–5)
HEMATOCRIT: 34.9 % — AB (ref 36.0–46.0)
HEMOGLOBIN: 11.8 g/dL — AB (ref 12.0–15.0)
LYMPHS ABS: 2.4 10*3/uL (ref 0.7–4.0)
Lymphocytes Relative: 28 % (ref 12–46)
MCH: 32.4 pg (ref 26.0–34.0)
MCHC: 33.8 g/dL (ref 30.0–36.0)
MCV: 95.9 fL (ref 78.0–100.0)
MONO ABS: 0.4 10*3/uL (ref 0.1–1.0)
MONOS PCT: 5 % (ref 3–12)
NEUTROS ABS: 5.5 10*3/uL (ref 1.7–7.7)
Neutrophils Relative %: 66 % (ref 43–77)
Platelets: 393 10*3/uL (ref 150–400)
RBC: 3.64 MIL/uL — ABNORMAL LOW (ref 3.87–5.11)
RDW: 14.6 % (ref 11.5–15.5)
WBC: 8.4 10*3/uL (ref 4.0–10.5)

## 2013-03-13 LAB — COMPLETE METABOLIC PANEL WITH GFR
ALBUMIN: 3.9 g/dL (ref 3.5–5.2)
ALK PHOS: 284 U/L — AB (ref 39–117)
ALT: 16 U/L (ref 0–35)
AST: 18 U/L (ref 0–37)
BUN: 21 mg/dL (ref 6–23)
CO2: 24 meq/L (ref 19–32)
Calcium: 8.6 mg/dL (ref 8.4–10.5)
Chloride: 108 mEq/L (ref 96–112)
Creat: 0.96 mg/dL (ref 0.50–1.10)
GFR, EST AFRICAN AMERICAN: 79 mL/min
GFR, EST NON AFRICAN AMERICAN: 69 mL/min
GLUCOSE: 94 mg/dL (ref 70–99)
POTASSIUM: 4.1 meq/L (ref 3.5–5.3)
Sodium: 142 mEq/L (ref 135–145)
Total Bilirubin: 0.2 mg/dL (ref 0.2–1.2)
Total Protein: 7.2 g/dL (ref 6.0–8.3)

## 2013-03-13 LAB — LIPID PANEL
CHOL/HDL RATIO: 2.4 ratio
CHOLESTEROL: 203 mg/dL — AB (ref 0–200)
HDL: 84 mg/dL (ref >39)
LDL Cholesterol: 99 mg/dL (ref 0–99)
Triglycerides: 99 mg/dL (ref <150)
VLDL: 20 mg/dL (ref 0–40)

## 2013-03-14 LAB — T-HELPER CELL (CD4) - (RCID CLINIC ONLY)
CD4 T CELL ABS: 420 /uL (ref 400–2700)
CD4 T CELL HELPER: 17 % — AB (ref 33–55)

## 2013-03-14 LAB — RPR

## 2013-03-14 LAB — HEPATITIS C ANTIBODY: HCV Ab: NEGATIVE

## 2013-03-15 LAB — HIV-1 RNA QUANT-NO REFLEX-BLD
HIV 1 RNA Quant: <20 copies/mL (ref <20)
HIV-1 RNA Quant, Log: <1.3 {Log} (ref <1.30)

## 2013-03-27 ENCOUNTER — Encounter: Payer: Self-pay | Admitting: Infectious Disease

## 2013-03-27 ENCOUNTER — Ambulatory Visit (INDEPENDENT_AMBULATORY_CARE_PROVIDER_SITE_OTHER): Payer: Medicare Other | Admitting: Infectious Disease

## 2013-03-27 VITALS — BP 134/89 | HR 87 | Temp 97.5°F | Wt 133.0 lb

## 2013-03-27 DIAGNOSIS — B20 Human immunodeficiency virus [HIV] disease: Secondary | ICD-10-CM | POA: Diagnosis not present

## 2013-03-27 DIAGNOSIS — F411 Generalized anxiety disorder: Secondary | ICD-10-CM | POA: Diagnosis not present

## 2013-03-27 DIAGNOSIS — R634 Abnormal weight loss: Secondary | ICD-10-CM | POA: Diagnosis not present

## 2013-03-27 DIAGNOSIS — J3489 Other specified disorders of nose and nasal sinuses: Secondary | ICD-10-CM | POA: Diagnosis not present

## 2013-03-27 LAB — COMPLETE METABOLIC PANEL WITH GFR
ALBUMIN: 3.9 g/dL (ref 3.5–5.2)
ALT: 16 U/L (ref 0–35)
AST: 17 U/L (ref 0–37)
Alkaline Phosphatase: 292 U/L — ABNORMAL HIGH (ref 39–117)
BUN: 22 mg/dL (ref 6–23)
CALCIUM: 8.8 mg/dL (ref 8.4–10.5)
CO2: 23 meq/L (ref 19–32)
Chloride: 109 mEq/L (ref 96–112)
Creat: 0.85 mg/dL (ref 0.50–1.10)
GFR, Est African American: >89 mL/min
GFR, Est Non African American: 80 mL/min
Glucose, Bld: 88 mg/dL (ref 70–99)
POTASSIUM: 4.2 meq/L (ref 3.5–5.3)
Sodium: 140 mEq/L (ref 135–145)
Total Bilirubin: 0.2 mg/dL (ref 0.2–1.2)
Total Protein: 7.2 g/dL (ref 6.0–8.3)

## 2013-03-27 LAB — CBC WITH DIFFERENTIAL/PLATELET
BASOS ABS: 0.1 10*3/uL (ref 0.0–0.1)
BASOS PCT: 1 % (ref 0–1)
EOS ABS: 0.1 10*3/uL (ref 0.0–0.7)
EOS PCT: 1 % (ref 0–5)
HCT: 33.4 % — ABNORMAL LOW (ref 36.0–46.0)
Hemoglobin: 11.5 g/dL — ABNORMAL LOW (ref 12.0–15.0)
Lymphocytes Relative: 34 % (ref 12–46)
Lymphs Abs: 2.8 10*3/uL (ref 0.7–4.0)
MCH: 32.3 pg (ref 26.0–34.0)
MCHC: 34.4 g/dL (ref 30.0–36.0)
MCV: 93.8 fL (ref 78.0–100.0)
Monocytes Absolute: 0.4 10*3/uL (ref 0.1–1.0)
Monocytes Relative: 5 % (ref 3–12)
Neutro Abs: 4.9 10*3/uL (ref 1.7–7.7)
Neutrophils Relative %: 59 % (ref 43–77)
PLATELETS: 367 10*3/uL (ref 150–400)
RBC: 3.56 MIL/uL — ABNORMAL LOW (ref 3.87–5.11)
RDW: 14.9 % (ref 11.5–15.5)
WBC: 8.3 10*3/uL (ref 4.0–10.5)

## 2013-03-27 LAB — CORTISOL: Cortisol, Plasma: 10.3 ug/dL

## 2013-03-27 LAB — T4, FREE: Free T4: 0.97 ng/dL (ref 0.80–1.80)

## 2013-03-27 LAB — TSH: TSH: 1.532 u[IU]/mL (ref 0.350–4.500)

## 2013-03-27 LAB — T3: T3 TOTAL: 140.6 ng/dL (ref 80.0–204.0)

## 2013-03-27 MED ORDER — MIRTAZAPINE 30 MG PO TABS: 30.0000 mg | ORAL_TABLET | Freq: Every day | ORAL | Status: DC

## 2013-03-27 MED ORDER — ONDANSETRON HCL 4 MG PO TABS: 4.0000 mg | ORAL_TABLET | Freq: Four times a day (QID) | ORAL | Status: DC | PRN

## 2013-03-27 MED ORDER — ELVITEG-COBIC-EMTRICIT-TENOFDF 150-150-200-300 MG PO TABS: 1.0000 | ORAL_TABLET | Freq: Every day | ORAL | Status: DC

## 2013-03-27 NOTE — Patient Instructions (Signed)
We will change your medicine to   STRIBILD (green pill) with food  I will write medicine for nausea to take if you have this with this medicine  We will repeat labs for 042 in one month with visit with Dr. Tommy Medal  We will do blood work today to check out your thyroid and adrenal gland and we need to have you see a GI doc for colonoscopy  Stop your bp meds

## 2013-03-27 NOTE — Progress Notes (Signed)
  Subjective:    Patient ID: Molly Dillon, female    DOB: November 03, 1960, 53 y.o.   MRN: [id number]  HPI   53 year old [place] American lady with perfectly controlled HIV on atripla, also with asthma, HTN. She has it appears lost at least 20 # of weight since a year ago. She claims this is NOT due to poor intake. She is under considerable stress related to her niece's son who was murdered about a year ago.   She has not had TFTs or cortisol level checked. She has never had a screening colonoscopy.  Review of Systems  Constitutional: Positive for fatigue and unexpected weight change. Negative for fever, chills, diaphoresis, activity change and appetite change.  HENT: Negative for congestion, rhinorrhea, sinus pressure, sneezing, sore throat and trouble swallowing.   Eyes: Negative for photophobia and visual disturbance.  Respiratory: Negative for cough, chest tightness, shortness of breath, wheezing and stridor.   Cardiovascular: Negative for chest pain, palpitations and leg swelling.  Gastrointestinal: Negative for nausea, vomiting, abdominal pain, diarrhea, constipation, blood in stool, abdominal distention and anal bleeding.  Genitourinary: Negative for dysuria, hematuria, flank pain and difficulty urinating.  Musculoskeletal: Negative for arthralgias, back pain, gait problem, joint swelling and myalgias.  Skin: Negative for color change, pallor, rash and wound.  Neurological: Negative for dizziness, tremors, weakness and light-headedness.  Hematological: Negative for adenopathy. Does not bruise/bleed easily.  Psychiatric/Behavioral: Positive for sleep disturbance and dysphoric mood. Negative for behavioral problems, confusion, decreased concentration and agitation.       Objective:   Physical Exam  Constitutional: She is oriented to person, place, and time. She appears well-developed and well-nourished. No distress.  HENT:  Head: Normocephalic and atraumatic.  Mouth/Throat: Oropharynx is  clear and moist. No oropharyngeal exudate.  Eyes: Conjunctivae and EOM are normal. No scleral icterus.  Neck: Normal range of motion. Neck supple. No JVD present.  Cardiovascular: Normal rate, regular rhythm and normal heart sounds.  Exam reveals no gallop and no friction rub.   No murmur heard. Pulmonary/Chest: Effort normal and breath sounds normal. No respiratory distress. She has no wheezes. She has no rales. She exhibits no tenderness.  Abdominal: She exhibits no distension and no mass. There is no tenderness. There is no rebound and no guarding.  Musculoskeletal: She exhibits no edema and no tenderness.  Lymphadenopathy:    She has no cervical adenopathy.  Neurological: She is alert and oriented to person, place, and time. She exhibits normal muscle tone. Coordination normal.  Skin: Skin is warm and dry. She is not diaphoretic. No erythema. No pallor.  Psychiatric: She has a normal mood and affect. Her behavior is normal. Judgment and thought content normal.          Assessment & Plan:  HIV: change from Atripla to Stribild in case the efavirenz is worsenign her insomnia and depression, rtc in in 4 weeks for repeat labs. I spent greater than 60 minutes with the patient including greater than 50% of time in face to face counsel of the patient and in coordination of their care.    Weight loss;  check TSH, t4, t3 cortisol, refer to screening colonoscopy.   HTN: bp at goal now even not on her 3 antihypertensives. Due to her weight loss  Insominia, depression anxiety: continue remeron, change off of ATripla  Asthma: continue steroid inhaler (QVAR)

## 2013-03-28 LAB — HIV-1 RNA QUANT-NO REFLEX-BLD: HIV-1 RNA Quant, Log: <1.3 {Log} (ref <1.30)

## 2013-03-28 LAB — T-HELPER CELL (CD4) - (RCID CLINIC ONLY)
CD4 % Helper T Cell: 22 % — ABNORMAL LOW (ref 33–55)
CD4 T Cell Abs: 650 /uL (ref 400–2700)

## 2013-03-29 ENCOUNTER — Telehealth: Payer: Self-pay | Admitting: *Deleted

## 2013-03-29 NOTE — Telephone Encounter (Signed)
Authorization obtained through 03/29/14, #[id number].

## 2013-03-31 LAB — HLA B*5701: HLA-B*5701 w/rflx HLA-B High: NEGATIVE

## 2013-04-04 ENCOUNTER — Telehealth: Payer: Self-pay | Admitting: Licensed Clinical Social Worker

## 2013-04-04 NOTE — Telephone Encounter (Signed)
Patient sees Internal Medicine at [facility], I called and left her a message to have her doctor refer her for a colonoscopy due to [place] being specialty practice and this falls under primary care.

## 2013-04-21 ENCOUNTER — Encounter: Payer: Self-pay | Admitting: *Deleted

## 2013-05-22 ENCOUNTER — Other Ambulatory Visit: Payer: Self-pay | Admitting: Infectious Disease

## 2013-05-22 ENCOUNTER — Ambulatory Visit (INDEPENDENT_AMBULATORY_CARE_PROVIDER_SITE_OTHER): Payer: Medicare Other | Admitting: Infectious Disease

## 2013-05-22 ENCOUNTER — Encounter: Payer: Self-pay | Admitting: Infectious Disease

## 2013-05-22 VITALS — BP 123/77 | HR 73 | Temp 97.5°F | Ht 64.0 in | Wt 132.0 lb

## 2013-05-22 DIAGNOSIS — Z79899 Other long term (current) drug therapy: Secondary | ICD-10-CM | POA: Diagnosis not present

## 2013-05-22 DIAGNOSIS — Z113 Encounter for screening for infections with a predominantly sexual mode of transmission: Secondary | ICD-10-CM

## 2013-05-22 DIAGNOSIS — B20 Human immunodeficiency virus [HIV] disease: Secondary | ICD-10-CM | POA: Diagnosis not present

## 2013-05-22 LAB — COMPLETE METABOLIC PANEL WITH GFR
ALT: 26 U/L (ref 0–35)
AST: 21 U/L (ref 0–37)
Albumin: 4 g/dL (ref 3.5–5.2)
Alkaline Phosphatase: 318 U/L — ABNORMAL HIGH (ref 39–117)
BUN: 19 mg/dL (ref 6–23)
CALCIUM: 9.2 mg/dL (ref 8.4–10.5)
CHLORIDE: 107 meq/L (ref 96–112)
CO2: 22 meq/L (ref 19–32)
CREATININE: 1.09 mg/dL (ref 0.50–1.10)
GFR, Est African American: 68 mL/min
GFR, Est Non African American: 59 mL/min — ABNORMAL LOW
Glucose, Bld: 82 mg/dL (ref 70–99)
Potassium: 4.2 mEq/L (ref 3.5–5.3)
Sodium: 140 mEq/L (ref 135–145)
Total Bilirubin: 0.2 mg/dL (ref 0.2–1.2)
Total Protein: 7.2 g/dL (ref 6.0–8.3)

## 2013-05-22 MED ORDER — TRIAMCINOLONE ACETONIDE 0.5 % EX OINT: 1.0000 "application " | TOPICAL_OINTMENT | Freq: Two times a day (BID) | CUTANEOUS | Status: DC

## 2013-05-22 MED ORDER — MIRTAZAPINE 45 MG PO TABS: 45.0000 mg | ORAL_TABLET | Freq: Every day | ORAL | Status: DC

## 2013-05-22 MED ORDER — DRONABINOL 5 MG PO CAPS: 5.0000 mg | ORAL_CAPSULE | Freq: Two times a day (BID) | ORAL | Status: DC

## 2013-05-22 NOTE — Progress Notes (Signed)
Subjective:    Patient ID: Molly Dillon, female    DOB: Nov 19, 1960, 53 y.o.   MRN: [id number]  HPI  53 year old [place] American lady with perfectly controlled HIV on atripla, also with asthma, HTN. She has it appears lost at least 20 # of weight since a year ago  She has not had TFTs or cortisol level checked. She has never had a screening colonoscopy.  Closer examination it seems that she has lost weight like this in the past as well. Her BMI is actually not an unhealthy level and I think her weight is so that we don't need to worry about so much.  Subjective:    Patient ID: Molly Dillon, female    DOB: 29-Jan-1960, 53 y.o.   MRN: [id number]  HPI   53 year old [place] lady with perfectly controlled HIV on atripla, also with asthma, HTN. She has it appears lost at least 20 # of weight since a year ago  She has not had TFTs or cortisol level checked. She has never had a screening colonoscopy.  Closer examination it seems that she has lost weight like this in the past as well. Her BMI is actually not an unhealthy level and I think her weight is so that we don't need to worry about so much.  Review of Systems  Constitutional: Negative for fever, chills, diaphoresis, activity change, appetite change, fatigue and unexpected weight change.  HENT: Negative for congestion, rhinorrhea, sinus pressure, sneezing, sore throat and trouble swallowing.   Eyes: Negative for photophobia and visual disturbance.  Respiratory: Negative for cough, chest tightness, shortness of breath, wheezing and stridor.   Cardiovascular: Negative for chest pain, palpitations and leg swelling.  Gastrointestinal: Negative for nausea, vomiting, abdominal pain, diarrhea, constipation, blood in stool, abdominal distention and anal bleeding.  Genitourinary: Negative for dysuria, hematuria, flank pain and difficulty urinating.  Musculoskeletal: Negative for arthralgias, back pain, gait problem, joint swelling and  myalgias.  Skin: Negative for color change, pallor, rash and wound.  Neurological: Negative for dizziness, tremors, weakness and light-headedness.  Hematological: Negative for adenopathy. Does not bruise/bleed easily.  Psychiatric/Behavioral: Negative for behavioral problems, confusion, sleep disturbance, dysphoric mood, decreased concentration and agitation.       Objective:   Physical Exam  Constitutional: She is oriented to person, place, and time. She appears well-developed and well-nourished. No distress.  HENT:  Head: Normocephalic and atraumatic.  Mouth/Throat: Oropharynx is clear and moist. No oropharyngeal exudate.  Eyes: Conjunctivae and EOM are normal. Pupils are equal, round, and reactive to light. No scleral icterus.  Neck: Normal range of motion. Neck supple. No JVD present.  Cardiovascular: Normal rate, regular rhythm and normal heart sounds.  Exam reveals no gallop and no friction rub.   No murmur heard. Pulmonary/Chest: Effort normal and breath sounds normal. No respiratory distress. She has no wheezes. She has no rales. She exhibits no tenderness.  Abdominal: She exhibits no distension and no mass. There is no tenderness. There is no rebound and no guarding.  Musculoskeletal: She exhibits no edema and no tenderness.  Lymphadenopathy:    She has no cervical adenopathy.  Neurological: She is alert and oriented to person, place, and time. She exhibits normal muscle tone. Coordination normal.  Skin: Skin is warm and dry. She is not diaphoretic. No erythema. No pallor.  Psychiatric: She has a normal mood and affect. Her behavior is normal. Judgment and thought content normal.  Review of Systems     Objective:   Physical Exam        Assessment & Plan:   [id number]:[id number] Stribild and recheck labs today I spent greater than 25 minutes with the patient including greater than 50% of time in face to face counsel of the patient and in coordination of their  care.  Insominia, depression anxiety: Increased dose to 45 mg of remeron,   Asthma: continue steroid inhaler (QVAR)

## 2013-05-22 NOTE — Patient Instructions (Signed)
Make appt with [facility]  Blood work today  Blood work in 6 months with appt with Dr. Tommy Medal  Try higher dose of remeron 45mg   If you need to make appt with Dr. Tommy Medal re the insomnia before 6 months give [place] a call and will make appt

## 2013-05-23 ENCOUNTER — Telehealth: Payer: Self-pay | Admitting: *Deleted

## 2013-05-23 LAB — T-HELPER CELLS (CD4) COUNT (NOT AT ARMC)
Absolute CD4: 581 /uL (ref 381–1469)
CD4 T Helper %: 22 % — ABNORMAL LOW (ref 32–62)
Total Lymphocyte: 44 % (ref 12–46)
Total lymphocyte count: 2640 /uL (ref 700–3300)
WBC, LYMPH ENUMERATION: 6 10*3/uL (ref 4.0–10.5)

## 2013-05-23 NOTE — Addendum Note (Signed)
Addended by: Landis Gandy on: 05/23/2013 10:29 AM   Modules accepted: Orders

## 2013-05-23 NOTE — Addendum Note (Signed)
Addended by: Dolan Amen D on: 05/23/2013 10:35 AM   Modules accepted: Orders

## 2013-05-23 NOTE — Addendum Note (Signed)
Addended byMarlis Edelson on: 05/23/2013 10:31 AM   Modules accepted: Orders

## 2013-05-23 NOTE — Telephone Encounter (Signed)
error 

## 2013-05-24 ENCOUNTER — Telehealth: Payer: Self-pay | Admitting: *Deleted

## 2013-05-24 ENCOUNTER — Other Ambulatory Visit: Payer: Self-pay | Admitting: *Deleted

## 2013-05-24 LAB — RPR

## 2013-05-24 LAB — HIV-1 RNA QUANT-NO REFLEX-BLD
HIV 1 RNA Quant: <20 copies/mL (ref <20)
HIV-1 RNA Quant, Log: <1.3 {Log} (ref <1.30)

## 2013-05-24 NOTE — Telephone Encounter (Signed)
Message copied by Georgena Spurling on Wed May 24, 2013  9:02 AM ------      Message from: VAN DAM, CORNELIUS N      Created: Wed May 24, 2013  8:49 AM       I have been giving her triamcinolone, She can keep using that I think I should probably biopsy it if not better in next month      ----- Message -----         From: Myrtis Hopping, CMA         Sent: 05/23/2013  10:39 AM           To: Truman Hayward, MD            Dr. Tommy Medal I have been unable to find a dermatologist that is accepting new Medicaid patients. Should I have patient see her PCP or it there any new medication you want to prescribe for patient to try.       Thanks,      Kennyth Lose       ------

## 2013-05-24 NOTE — Telephone Encounter (Signed)
I notified patient and she will continue using the triamcinolone and if no improvement in a month will call to schedule a follow up appointment. Molly Dillon

## 2013-05-24 NOTE — Telephone Encounter (Signed)
Prior Authorization, Dronabinol rx through [facility]. Authorization # [id number].  Faxed refill request back to [facility].

## 2013-06-13 ENCOUNTER — Encounter: Payer: Medicare Other | Admitting: Internal Medicine

## 2013-06-15 ENCOUNTER — Ambulatory Visit (INDEPENDENT_AMBULATORY_CARE_PROVIDER_SITE_OTHER): Payer: Medicare Other | Admitting: Internal Medicine

## 2013-06-15 ENCOUNTER — Encounter: Payer: Self-pay | Admitting: Internal Medicine

## 2013-06-15 VITALS — BP 119/64 | HR 84 | Temp 97.0°F | Resp 20 | Ht 64.0 in | Wt 134.7 lb

## 2013-06-15 DIAGNOSIS — F17200 Nicotine dependence, unspecified, uncomplicated: Secondary | ICD-10-CM | POA: Insufficient documentation

## 2013-06-15 DIAGNOSIS — R634 Abnormal weight loss: Secondary | ICD-10-CM | POA: Diagnosis not present

## 2013-06-15 DIAGNOSIS — J45909 Unspecified asthma, uncomplicated: Secondary | ICD-10-CM

## 2013-06-15 DIAGNOSIS — B20 Human immunodeficiency virus [HIV] disease: Secondary | ICD-10-CM

## 2013-06-15 DIAGNOSIS — Z0000 Encounter for general adult medical examination without abnormal findings: Secondary | ICD-10-CM | POA: Insufficient documentation

## 2013-06-15 NOTE — Assessment & Plan Note (Signed)
Cut down to 3 -4 cigarettes a day from 1.5 packs per day. Next goal - 1 cigarette a day.  Discussed about this about 5 minutes this visit.

## 2013-06-15 NOTE — Assessment & Plan Note (Signed)
With ID - follows Dr Tommy Medal.

## 2013-06-15 NOTE — Patient Instructions (Signed)
Ms Molly Dillon, [place] to see you today. You inform me that your asthma is better - I am glad. I congratulate you on your efforts to cut down on cigarettes.  Please stop Dronabinol if it is not suiting you. Please let Dr Arlyss [facility] clinic know about this.  I have given you a colonoscopy referral.  I will give you information to schedule yourself for PAP smear clinic.   Thanks, Madilyn Fireman MD MPH 06/15/2013 9:46 AM Clinic Phone [phone]

## 2013-06-15 NOTE — Assessment & Plan Note (Addendum)
Assessment: Was started on Dronabinol by Dr Tommy Medal - reports side effects and is unable to take the medicine.   Plan:  Ok to discontinue medication if she is not able to take it.   Asked to call Dr [facility] clinic and let them know and ask for alternatives.   I sent message to Dr Tommy Medal about this as well.

## 2013-06-15 NOTE — Assessment & Plan Note (Signed)
Assessment: Well controlled. Fewer awakenings at night. Using all inhalers.  Plan:  Continue current treatment with no change.

## 2013-06-15 NOTE — Assessment & Plan Note (Signed)
Health Maintenance Due  Topic Date Due  . Colonoscopy  08/17/2011   Referral given. Patient will schedule herself in PAP smear clinic.

## 2013-06-15 NOTE — Progress Notes (Signed)
Subjective:     Patient ID: Molly Dillon, female   DOB: 1960-12-23, 53 y.o.   MRN: [id number]  HPI Molly Dillon follows with the clinic for uncontrolled asthma, hypertension, and hyperlipidemia. She also has a history of HIV for which she follows with Dr Tommy Medal. She has no new complaints. Her asthma is well controlled and she is compliant with all her medications. She is currently using dronabinol started by Dr Tommy Medal and reports having blurry vision, dizziness and nausea with it. She has not had any change in appetite with it. She wants to stop it and wants to try more eating on her own. She has been working on smoking cessation and is down to 3 cigarettes per day from 15 cigarettes per day. She reports a rash on left arm which is better, and is treated by ID.  Review of Systems  Constitutional: Negative for fever, activity change, fatigue and unexpected weight change.  Eyes: Positive for visual disturbance.  Respiratory: Negative for cough, choking, chest tightness, shortness of breath, wheezing and stridor.   Cardiovascular: Negative for chest pain, palpitations and leg swelling.  Gastrointestinal: Positive for nausea. Negative for abdominal pain, diarrhea and constipation.  Musculoskeletal: Negative for arthralgias, gait problem and myalgias.  Skin: Positive for rash (being treated by ID).  Neurological: Positive for dizziness. Negative for weakness, numbness and headaches.       Objective:   Physical Exam  Constitutional: She is oriented to person, place, and time. She appears well-developed and well-nourished.  Eyes: Conjunctivae and EOM are normal. Pupils are equal, round, and reactive to light.  Does not report visual abnormalities at present.  Neck: Normal range of motion.  Cardiovascular: Normal rate, regular rhythm, normal heart sounds and intact distal pulses.   Pulmonary/Chest: Effort normal and breath sounds normal. No respiratory distress. She has no wheezes. She has no rales.  She exhibits no tenderness.  Musculoskeletal: She exhibits no edema.  Neurological: She is alert and oriented to person, place, and time.  Skin: Skin is warm.       Assessment & Plan:    Please see problem based charting.

## 2013-06-15 NOTE — Addendum Note (Signed)
Addended by: Madilyn Fireman on: 06/15/2013 04:03 PM   Modules accepted: Orders

## 2013-06-28 ENCOUNTER — Encounter (HOSPITAL_COMMUNITY): Payer: Self-pay | Admitting: Emergency Medicine

## 2013-06-28 ENCOUNTER — Emergency Department (INDEPENDENT_AMBULATORY_CARE_PROVIDER_SITE_OTHER)
Admission: EM | Admit: 2013-06-28 | Discharge: 2013-06-28 | Disposition: A | Discharge status: Home / Self Care | Payer: Medicare Other | Source: Home / Self Care | Attending: Family Medicine | Admitting: Family Medicine

## 2013-06-28 DIAGNOSIS — X58XXXA Exposure to other specified factors, initial encounter: Secondary | ICD-10-CM | POA: Diagnosis not present

## 2013-06-28 DIAGNOSIS — S058X9A Other injuries of unspecified eye and orbit, initial encounter: Secondary | ICD-10-CM | POA: Diagnosis not present

## 2013-06-28 DIAGNOSIS — S0500XA Injury of conjunctiva and corneal abrasion without foreign body, unspecified eye, initial encounter: Secondary | ICD-10-CM

## 2013-06-28 MED ORDER — ERYTHROMYCIN 5 MG/GM OP OINT: TOPICAL_OINTMENT | OPHTHALMIC | Status: DC

## 2013-06-28 MED ORDER — TETRACAINE HCL 0.5 % OP SOLN
OPHTHALMIC | Status: AC
  Filled 2013-06-28: qty 2

## 2013-06-28 MED ORDER — TETRACAINE HCL 0.5 % OP SOLN
1.0000 [drp] | Freq: Once | OPHTHALMIC | Status: AC
  Administered 2013-06-28: 2 [drp] via OPHTHALMIC

## 2013-06-28 NOTE — Discharge Instructions (Signed)
Thank you for coming in today. Followup with the eye doctor.  Use erythromycin ointment as directed.    Corneal Abrasion The cornea is the clear covering at the front and center of the eye. When looking at the colored portion of the eye (iris), you are looking through the cornea. This very thin tissue is made up of many layers. The surface layer is a single layer of cells (corneal epithelium) and is one of the most sensitive tissues in the body. If a scratch or injury causes the corneal epithelium to come off, it is called a corneal abrasion. If the injury extends to the tissues below the epithelium, the condition is called a corneal ulcer. CAUSES   Scratches.  Trauma.  Foreign body in the eye. Some people have recurrences of abrasions in the area of the original injury even after it has healed (recurrent erosion syndrome). Recurrent erosion syndrome generally improves and goes away with time. SYMPTOMS   Eye pain.  Difficulty or inability to keep the injured eye open.  The eye becomes very sensitive to light.  Recurrent erosions tend to happen suddenly, first thing in the morning, usually after waking up and opening the eye. DIAGNOSIS  Your health care provider can diagnose a corneal abrasion during an eye exam. Dye is usually placed in the eye using a drop or a small paper strip moistened by your tears. When the eye is examined with a special light, the abrasion shows up clearly because of the dye. TREATMENT   Small abrasions may be treated with antibiotic drops or ointment alone.  A pressure patch may be put over the eye. If this is done, follow your doctor's instructions for when to remove the patch. Do not drive or use machines while the eye patch is on. Judging distances is hard to do with a patch on. If the abrasion becomes infected and spreads to the deeper tissues of the cornea, a corneal ulcer can result. This is serious because it can cause corneal scarring. Corneal scars  interfere with light passing through the cornea and cause a loss of vision in the involved eye. HOME CARE INSTRUCTIONS  Use medicine or ointment as directed. Only take over-the-counter or prescription medicines for pain, discomfort, or fever as directed by your health care provider.  Do not drive or operate machinery if your eye is patched. Your ability to judge distances is impaired.  If your health care provider has given you a follow-up appointment, it is very important to keep that appointment. Not keeping the appointment could result in a severe eye infection or permanent loss of vision. If there is any problem keeping the appointment, let your health care provider know. SEEK MEDICAL CARE IF:   You have pain, light sensitivity, and a scratchy feeling in one eye or both eyes.  Your pressure patch keeps loosening up, and you can blink your eye under the patch after treatment.  Any kind of discharge develops from the eye after treatment or if the lids stick together in the morning.  You have the same symptoms in the morning as you did with the original abrasion days, weeks, or months after the abrasion healed. MAKE SURE YOU:   Understand these instructions.  Will watch your condition.  Will get help right away if you are not doing well or get worse. Document Released: 12/27/1999 Document Revised: 01/03/2013 Document Reviewed: 09/05/2012 [facility] Patient Information 2015 [place], [place]. This information is not intended to replace advice given to you by  your health care provider. Make sure you discuss any questions you have with your health care provider. ° °

## 2013-06-28 NOTE — ED Notes (Signed)
C/o left eye pain

## 2013-06-28 NOTE — ED Provider Notes (Signed)
Molly Dillon is a 53 y.o. female who presents to Urgent Care today for left eye irritation. Patient has had one month of mild intermittent left eye irritation. Her symptoms worsened yesterday evening. She denies any significant pain or blurry vision. She denies any foreign body. She notes multiple family members with similar symptoms recently. No fevers or chills nausea vomiting or diarrhea.  Patient does have HIV however her disease is well controlled with a high CD4 count.   Past Medical History  Diagnosis Date  . Asthma   . HIV infection   . Hypertension   . Depression   . Hyperlipidemia    History  Substance Use Topics  . Smoking status: Current Every Day Smoker -- 0.20 packs/day    Types: Cigarettes  . Smokeless tobacco: Never Used     Comment: down to 3 cigs a day  . Alcohol Use: No   ROS as above Medications: No current facility-administered medications for this encounter.   Current Outpatient Prescriptions  Medication Sig Dispense Refill  . albuterol (PROVENTIL HFA;VENTOLIN HFA) 108 (90 BASE) MCG/ACT inhaler Inhale 2 puffs into the lungs every 6 (six) hours as needed for wheezing.  1 Inhaler  2  . beclomethasone (QVAR) 40 MCG/ACT inhaler Inhale 2 puffs into the lungs daily.  1 Inhaler  12  . dronabinol (MARINOL) 5 MG capsule Take 1 capsule (5 mg total) by mouth 2 (two) times daily before a meal.  60 capsule  4  . elvitegravir-cobicistat-emtricitabine-tenofovir (STRIBILD) 150-150-200-300 MG TABS tablet Take 1 tablet by mouth daily with breakfast.  30 tablet  11  . erythromycin ophthalmic ointment Place a 1/2 inch ribbon of ointment into the lower eyelid qid x1 week  1 g  3  . mirtazapine (REMERON) 45 MG tablet Take 1 tablet (45 mg total) by mouth at bedtime.  30 tablet  11  . ondansetron (ZOFRAN) 4 MG tablet Take 1 tablet (4 mg total) by mouth 4 (four) times daily as needed for nausea or vomiting.  120 tablet  3  . sodium chloride (OCEAN NASAL SPRAY) 0.65 % nasal spray Place  1 spray into the nose as needed for congestion.  30 mL  12  . tiotropium (SPIRIVA HANDIHALER) 18 MCG inhalation capsule Place 1 capsule (18 mcg total) into inhaler and inhale daily.  30 capsule  2  . triamcinolone ointment (KENALOG) 0.5 % Apply 1 application topically 2 (two) times daily.  30 g  3    Exam:  BP 121/84  Pulse 90  Temp(Src) 98 F (36.7 C) (Oral)  Resp 12  SpO2 100%  LMP 01/12/2011 Gen: Well NAD HEENT: EOMI,  MMM LEFT eye with mild conjunctival injection. Fluorescein stain shows a small area of uptake at the 6:00 position. PERRLA bilaterally. Funduscopic exam is normal bilaterally. Lungs: Normal work of breathing. CTABL Heart: RRR no MRG Abd: NABS, Soft. NT, ND Exts: Brisk capillary refill, warm and well perfused.   No results found for this or any previous visit (from the past 24 hour(s)). No results found.  Assessment and Plan: 53 y.o. female with corneal abrasion versus conjunctivitis. Plan to treat with erythromycin ointment and followup with ophthalmology.  Discussed warning signs or symptoms. Please see discharge instructions. Patient expresses understanding.    Gregor Hams, MD 06/28/13 [phone]

## 2013-07-10 ENCOUNTER — Encounter: Payer: Self-pay | Admitting: Internal Medicine

## 2013-07-13 DIAGNOSIS — H11159 Pinguecula, unspecified eye: Secondary | ICD-10-CM | POA: Diagnosis not present

## 2013-07-13 DIAGNOSIS — S058X9A Other injuries of unspecified eye and orbit, initial encounter: Secondary | ICD-10-CM | POA: Diagnosis not present

## 2013-07-13 DIAGNOSIS — H16149 Punctate keratitis, unspecified eye: Secondary | ICD-10-CM | POA: Diagnosis not present

## 2013-08-08 DIAGNOSIS — S058X9A Other injuries of unspecified eye and orbit, initial encounter: Secondary | ICD-10-CM | POA: Diagnosis not present

## 2013-08-08 DIAGNOSIS — H11159 Pinguecula, unspecified eye: Secondary | ICD-10-CM | POA: Diagnosis not present

## 2013-08-08 DIAGNOSIS — H2510 Age-related nuclear cataract, unspecified eye: Secondary | ICD-10-CM | POA: Diagnosis not present

## 2013-08-08 DIAGNOSIS — H16149 Punctate keratitis, unspecified eye: Secondary | ICD-10-CM | POA: Diagnosis not present

## 2013-09-25 ENCOUNTER — Encounter: Payer: Medicare Other | Admitting: Internal Medicine

## 2013-10-17 ENCOUNTER — Encounter: Payer: Self-pay | Admitting: *Deleted

## 2013-10-25 NOTE — Addendum Note (Signed)
Addended by: Hulan Fray on: 10/25/2013 07:59 PM   Modules accepted: Orders

## 2013-10-26 DIAGNOSIS — H0014 Chalazion left upper eyelid: Secondary | ICD-10-CM | POA: Diagnosis not present

## 2013-10-26 DIAGNOSIS — H524 Presbyopia: Secondary | ICD-10-CM | POA: Diagnosis not present

## 2013-10-26 DIAGNOSIS — H35033 Hypertensive retinopathy, bilateral: Secondary | ICD-10-CM | POA: Diagnosis not present

## 2013-10-26 DIAGNOSIS — H04123 Dry eye syndrome of bilateral lacrimal glands: Secondary | ICD-10-CM | POA: Diagnosis not present

## 2013-11-22 ENCOUNTER — Other Ambulatory Visit (HOSPITAL_COMMUNITY)
Admission: RE | Admit: 2013-11-22 | Discharge: 2013-11-22 | Disposition: A | Discharge status: Home / Self Care | Payer: Medicare Other | Source: Ambulatory Visit | Attending: Infectious Disease | Admitting: Infectious Disease

## 2013-11-22 ENCOUNTER — Other Ambulatory Visit: Payer: Medicare Other

## 2013-11-22 DIAGNOSIS — B20 Human immunodeficiency virus [HIV] disease: Secondary | ICD-10-CM

## 2013-11-22 DIAGNOSIS — Z113 Encounter for screening for infections with a predominantly sexual mode of transmission: Secondary | ICD-10-CM | POA: Diagnosis not present

## 2013-11-22 DIAGNOSIS — Z79899 Other long term (current) drug therapy: Secondary | ICD-10-CM | POA: Diagnosis not present

## 2013-11-22 LAB — CBC WITH DIFFERENTIAL/PLATELET
Basophils Absolute: 0.1 10*3/uL (ref 0.0–0.1)
Basophils Relative: 1 % (ref 0–1)
EOS ABS: 0.2 10*3/uL (ref 0.0–0.7)
Eosinophils Relative: 2 % (ref 0–5)
HEMATOCRIT: 37.7 % (ref 36.0–46.0)
HEMOGLOBIN: 12.6 g/dL (ref 12.0–15.0)
LYMPHS ABS: 3.2 10*3/uL (ref 0.7–4.0)
Lymphocytes Relative: 40 % (ref 12–46)
MCH: 31.9 pg (ref 26.0–34.0)
MCHC: 33.4 g/dL (ref 30.0–36.0)
MCV: 95.4 fL (ref 78.0–100.0)
MONOS PCT: 7 % (ref 3–12)
Monocytes Absolute: 0.6 10*3/uL (ref 0.1–1.0)
NEUTROS PCT: 50 % (ref 43–77)
Neutro Abs: 4 10*3/uL (ref 1.7–7.7)
Platelets: 339 10*3/uL (ref 150–400)
RBC: 3.95 MIL/uL (ref 3.87–5.11)
RDW: 15.3 % (ref 11.5–15.5)
WBC: 8 10*3/uL (ref 4.0–10.5)

## 2013-11-22 LAB — COMPLETE METABOLIC PANEL WITH GFR
ALBUMIN: 3.8 g/dL (ref 3.5–5.2)
ALT: 17 U/L (ref 0–35)
AST: 16 U/L (ref 0–37)
Alkaline Phosphatase: 217 U/L — ABNORMAL HIGH (ref 39–117)
BUN: 23 mg/dL (ref 6–23)
CO2: 22 meq/L (ref 19–32)
Calcium: 9.1 mg/dL (ref 8.4–10.5)
Chloride: 109 mEq/L (ref 96–112)
Creat: 1.6 mg/dL — ABNORMAL HIGH (ref 0.50–1.10)
GFR, EST AFRICAN AMERICAN: 42 mL/min — AB
GFR, EST NON AFRICAN AMERICAN: 37 mL/min — AB
Glucose, Bld: 116 mg/dL — ABNORMAL HIGH (ref 70–99)
POTASSIUM: 4.3 meq/L (ref 3.5–5.3)
Sodium: 141 mEq/L (ref 135–145)
TOTAL PROTEIN: 7.4 g/dL (ref 6.0–8.3)
Total Bilirubin: 0.2 mg/dL (ref 0.2–1.2)

## 2013-11-22 LAB — LIPID PANEL
CHOL/HDL RATIO: 3.3 ratio
Cholesterol: 196 mg/dL (ref 0–200)
HDL: 59 mg/dL (ref >39)
LDL Cholesterol: 100 mg/dL — ABNORMAL HIGH (ref 0–99)
Triglycerides: 186 mg/dL — ABNORMAL HIGH (ref <150)
VLDL: 37 mg/dL (ref 0–40)

## 2013-11-22 LAB — RPR

## 2013-11-23 LAB — MICROALBUMIN / CREATININE URINE RATIO
CREATININE, URINE: 147.3 mg/dL
MICROALB UR: 10.7 mg/dL — AB (ref <2.0)
Microalb Creat Ratio: 72.6 mg/g — ABNORMAL HIGH (ref 0.0–30.0)

## 2013-11-23 LAB — T-HELPER CELL (CD4) - (RCID CLINIC ONLY)
CD4 % Helper T Cell: 21 % — ABNORMAL LOW (ref 33–55)
CD4 T Cell Abs: 660 /uL (ref 400–2700)

## 2013-11-23 LAB — HIV-1 RNA QUANT-NO REFLEX-BLD
HIV 1 RNA Quant: <20 copies/mL (ref <20)
HIV-1 RNA Quant, Log: <1.3 {Log} (ref <1.30)

## 2013-11-23 LAB — URINE CYTOLOGY ANCILLARY ONLY
CHLAMYDIA, DNA PROBE: NEGATIVE
Neisseria Gonorrhea: NEGATIVE

## 2013-12-06 ENCOUNTER — Encounter: Payer: Self-pay | Admitting: Infectious Disease

## 2013-12-06 ENCOUNTER — Ambulatory Visit (INDEPENDENT_AMBULATORY_CARE_PROVIDER_SITE_OTHER): Payer: Medicare Other | Admitting: Infectious Disease

## 2013-12-06 VITALS — BP 130/87 | HR 76 | Temp 97.6°F | Wt 154.0 lb

## 2013-12-06 DIAGNOSIS — F17200 Nicotine dependence, unspecified, uncomplicated: Secondary | ICD-10-CM | POA: Diagnosis not present

## 2013-12-06 DIAGNOSIS — J3089 Other allergic rhinitis: Secondary | ICD-10-CM | POA: Diagnosis not present

## 2013-12-06 DIAGNOSIS — B20 Human immunodeficiency virus [HIV] disease: Secondary | ICD-10-CM

## 2013-12-06 DIAGNOSIS — J4520 Mild intermittent asthma, uncomplicated: Secondary | ICD-10-CM

## 2013-12-06 DIAGNOSIS — F17210 Nicotine dependence, cigarettes, uncomplicated: Secondary | ICD-10-CM

## 2013-12-06 DIAGNOSIS — E7800 Pure hypercholesterolemia, unspecified: Secondary | ICD-10-CM

## 2013-12-06 DIAGNOSIS — E785 Hyperlipidemia, unspecified: Secondary | ICD-10-CM | POA: Diagnosis not present

## 2013-12-06 DIAGNOSIS — Z23 Encounter for immunization: Secondary | ICD-10-CM

## 2013-12-06 MED ORDER — PRAVASTATIN SODIUM 20 MG PO TABS: 20.0000 mg | ORAL_TABLET | Freq: Every day | ORAL | Status: DC

## 2013-12-06 NOTE — Patient Instructions (Signed)
Thanks for taking such good care of yourself!  I hope you have a fantastic THanksgiving!  Please make an appt with Langley Gauss for a pap smear  Come back and see me in 6 months time with labs  I have sent in script for cholesterol lowering drug = pravachol to your pharmacy

## 2013-12-06 NOTE — Progress Notes (Signed)
Subjective:    Patient ID: Molly Dillon, female    DOB: November 05, 1960, 53 y.o.   MRN: [id number]  Cough This is a new problem. The current episode started in the past 7 days. The problem has been gradually worsening. The problem occurs hourly. The cough is non-productive. Associated symptoms include postnasal drip. Pertinent negatives include no chest pain, chills, fever, hemoptysis, myalgias, rash, rhinorrhea, sore throat, shortness of breath, sweats, weight loss or wheezing. The symptoms are aggravated by dust and cold air. Risk factors for lung disease include smoking/tobacco exposure. She has tried nothing for the symptoms. The treatment provided no relief. There is no history of asthma.    53 year old [place] American lady with perfectly controlled HIV on STRIBILD   She has a healthy CD4 count and undetectable viral load.  Lab Results  Component Value Date   HIV1RNAQUANT <20 11/22/2013   Lab Results  Component Value Date   CD4TABS 660 11/22/2013   CD4TABS 650 03/27/2013   CD4TABS 420 03/13/2013     Subjective:    Patient ID: Molly Dillon, female    DOB: 12-Jan-1961, 53 y.o.   MRN: [id number]  HPI     Review of Systems  Constitutional: Negative for fever, chills, weight loss, diaphoresis, activity change, appetite change, fatigue and unexpected weight change.  HENT: Positive for postnasal drip. Negative for congestion, rhinorrhea, sinus pressure, sneezing, sore throat and trouble swallowing.   Eyes: Negative for photophobia and visual disturbance.  Respiratory: Positive for cough. Negative for hemoptysis, chest tightness, shortness of breath, wheezing and stridor.   Cardiovascular: Negative for chest pain, palpitations and leg swelling.  Gastrointestinal: Negative for nausea, vomiting, abdominal pain, diarrhea, constipation, blood in stool, abdominal distention and anal bleeding.  Genitourinary: Negative for dysuria, hematuria, flank pain and difficulty urinating.    Musculoskeletal: Negative for myalgias, back pain, joint swelling, arthralgias and gait problem.  Skin: Negative for color change, pallor, rash and wound.  Neurological: Negative for dizziness, tremors, weakness and light-headedness.  Hematological: Negative for adenopathy. Does not bruise/bleed easily.  Psychiatric/Behavioral: Negative for behavioral problems, confusion, sleep disturbance, dysphoric mood, decreased concentration and agitation.       Objective:   Physical Exam  Constitutional: She is oriented to person, place, and time. She appears well-developed and well-nourished. No distress.  HENT:  Head: Normocephalic and atraumatic.  Mouth/Throat: Oropharynx is clear and moist. No oropharyngeal exudate.  Eyes: Conjunctivae and EOM are normal. No scleral icterus.  Neck: Normal range of motion. Neck supple. No JVD present.  Cardiovascular: Normal rate, regular rhythm and normal heart sounds.  Exam reveals no gallop and no friction rub.   No murmur heard. Pulmonary/Chest: Effort normal and breath sounds normal. No respiratory distress. She has no wheezes. She has no rales. She exhibits no tenderness.  Abdominal: She exhibits no distension and no mass. There is no tenderness. There is no rebound and no guarding.  Musculoskeletal: She exhibits no edema or tenderness.  Lymphadenopathy:    She has no cervical adenopathy.  Neurological: She is alert and oriented to person, place, and time. She exhibits normal muscle tone. Coordination normal.  Skin: Skin is warm and dry. She is not diaphoretic. No erythema. No pallor.  Psychiatric: She has a normal mood and affect. Her behavior is normal. Judgment and thought content normal.           Review of Systems      Objective:   Physical Exam  Assessment & Plan:   QZE:SPQZRAQT Stribild and recheck labs in 6 months time I spent greater than 25 minutes with the patient including greater than 50% of time in face to face  counsel of the patient and in coordination of their care.  Cough: Needs to stop smoking. Lungs were fairly clear today. Is to ensure she is taking her asthma medicines otherwise she tries over-the-counter drugs for her sinus congestion Asthma: continue steroid inhaler (QVAR)  Smoking: spent greater than 3 minutes counseling her to stop smoking  Hyperlipidemia: started a statin (pravachol)

## 2014-04-30 ENCOUNTER — Other Ambulatory Visit: Payer: Self-pay

## 2014-04-30 DIAGNOSIS — B20 Human immunodeficiency virus [HIV] disease: Secondary | ICD-10-CM

## 2014-04-30 MED ORDER — ELVITEG-COBIC-EMTRICIT-TENOFDF 150-150-200-300 MG PO TABS: 1.0000 | ORAL_TABLET | Freq: Every day | ORAL | Status: DC

## 2014-05-23 ENCOUNTER — Other Ambulatory Visit (INDEPENDENT_AMBULATORY_CARE_PROVIDER_SITE_OTHER): Payer: Medicare Other

## 2014-05-23 ENCOUNTER — Other Ambulatory Visit (HOSPITAL_COMMUNITY)
Admission: RE | Admit: 2014-05-23 | Discharge: 2014-05-23 | Disposition: A | Discharge status: Home / Self Care | Payer: Medicare Other | Source: Ambulatory Visit | Attending: Infectious Disease | Admitting: Infectious Disease

## 2014-05-23 DIAGNOSIS — B20 Human immunodeficiency virus [HIV] disease: Secondary | ICD-10-CM | POA: Diagnosis not present

## 2014-05-23 DIAGNOSIS — Z113 Encounter for screening for infections with a predominantly sexual mode of transmission: Secondary | ICD-10-CM | POA: Insufficient documentation

## 2014-05-23 DIAGNOSIS — E785 Hyperlipidemia, unspecified: Secondary | ICD-10-CM | POA: Diagnosis not present

## 2014-05-23 LAB — COMPLETE METABOLIC PANEL WITHOUT GFR
ALT: 24 U/L (ref 0–35)
AST: 20 U/L (ref 0–37)
Albumin: 3.9 g/dL (ref 3.5–5.2)
Alkaline Phosphatase: 203 U/L — ABNORMAL HIGH (ref 39–117)
BUN: 27 mg/dL — ABNORMAL HIGH (ref 6–23)
CO2: 24 meq/L (ref 19–32)
Calcium: 9.5 mg/dL (ref 8.4–10.5)
Chloride: 106 meq/L (ref 96–112)
Creat: 1.84 mg/dL — ABNORMAL HIGH (ref 0.50–1.10)
GFR, Est African American: 36 mL/min — ABNORMAL LOW
GFR, Est Non African American: 31 mL/min — ABNORMAL LOW
Glucose, Bld: 85 mg/dL (ref 70–99)
Potassium: 4.7 meq/L (ref 3.5–5.3)
Sodium: 140 meq/L (ref 135–145)
Total Bilirubin: 0.2 mg/dL (ref 0.2–1.2)
Total Protein: 7.8 g/dL (ref 6.0–8.3)

## 2014-05-23 LAB — CBC WITH DIFFERENTIAL/PLATELET
Basophils Absolute: 0.1 K/uL (ref 0.0–0.1)
Basophils Relative: 1 % (ref 0–1)
Eosinophils Absolute: 0.2 K/uL (ref 0.0–0.7)
Eosinophils Relative: 2 % (ref 0–5)
HCT: 39.8 % (ref 36.0–46.0)
Hemoglobin: 13.2 g/dL (ref 12.0–15.0)
Lymphocytes Relative: 42 % (ref 12–46)
Lymphs Abs: 3.2 K/uL (ref 0.7–4.0)
MCH: 30.7 pg (ref 26.0–34.0)
MCHC: 33.2 g/dL (ref 30.0–36.0)
MCV: 92.6 fL (ref 78.0–100.0)
MPV: 8.9 fL (ref 8.6–12.4)
Monocytes Absolute: 0.7 K/uL (ref 0.1–1.0)
Monocytes Relative: 9 % (ref 3–12)
Neutro Abs: 3.5 K/uL (ref 1.7–7.7)
Neutrophils Relative %: 46 % (ref 43–77)
Platelets: 335 K/uL (ref 150–400)
RBC: 4.3 MIL/uL (ref 3.87–5.11)
RDW: 15.6 % — ABNORMAL HIGH (ref 11.5–15.5)
WBC: 7.5 K/uL (ref 4.0–10.5)

## 2014-05-23 LAB — LIPID PANEL
Cholesterol: 216 mg/dL — ABNORMAL HIGH (ref 0–200)
HDL: 49 mg/dL
LDL Cholesterol: 129 mg/dL — ABNORMAL HIGH (ref 0–99)
Total CHOL/HDL Ratio: 4.4 ratio
Triglycerides: 190 mg/dL — ABNORMAL HIGH
VLDL: 38 mg/dL (ref 0–40)

## 2014-05-23 LAB — HEPATITIS C ANTIBODY: HCV AB: NEGATIVE

## 2014-05-24 LAB — RPR

## 2014-05-24 LAB — MICROALBUMIN / CREATININE URINE RATIO
Creatinine, Urine: 68.2 mg/dL
MICROALB UR: 3 mg/dL — AB (ref <2.0)
MICROALB/CREAT RATIO: 44 mg/g — AB (ref 0.0–30.0)

## 2014-05-24 LAB — URINE CYTOLOGY ANCILLARY ONLY
Chlamydia: NEGATIVE
NEISSERIA GONORRHEA: NEGATIVE

## 2014-05-24 LAB — T-HELPER CELL (CD4) - (RCID CLINIC ONLY)
CD4 % Helper T Cell: 19 % — ABNORMAL LOW (ref 33–55)
CD4 T CELL ABS: 650 /uL (ref 400–2700)

## 2014-05-28 LAB — HIV-1 RNA QUANT-NO REFLEX-BLD

## 2014-05-30 ENCOUNTER — Other Ambulatory Visit: Payer: Self-pay | Admitting: *Deleted

## 2014-05-30 DIAGNOSIS — F329 Major depressive disorder, single episode, unspecified: Secondary | ICD-10-CM

## 2014-05-30 DIAGNOSIS — F32A Depression, unspecified: Secondary | ICD-10-CM

## 2014-05-30 MED ORDER — MIRTAZAPINE 45 MG PO TABS: 45.0000 mg | ORAL_TABLET | Freq: Every day | ORAL | Status: DC

## 2014-06-06 ENCOUNTER — Encounter: Payer: Self-pay | Admitting: Infectious Disease

## 2014-06-06 ENCOUNTER — Ambulatory Visit (INDEPENDENT_AMBULATORY_CARE_PROVIDER_SITE_OTHER): Payer: Medicare Other | Admitting: Infectious Disease

## 2014-06-06 VITALS — BP 125/85 | HR 86 | Temp 97.4°F | Wt 165.8 lb

## 2014-06-06 DIAGNOSIS — I10 Essential (primary) hypertension: Secondary | ICD-10-CM

## 2014-06-06 DIAGNOSIS — J209 Acute bronchitis, unspecified: Secondary | ICD-10-CM

## 2014-06-06 DIAGNOSIS — F17210 Nicotine dependence, cigarettes, uncomplicated: Secondary | ICD-10-CM | POA: Diagnosis not present

## 2014-06-06 DIAGNOSIS — J441 Chronic obstructive pulmonary disease with (acute) exacerbation: Secondary | ICD-10-CM | POA: Diagnosis not present

## 2014-06-06 DIAGNOSIS — F17200 Nicotine dependence, unspecified, uncomplicated: Secondary | ICD-10-CM

## 2014-06-06 DIAGNOSIS — J440 Chronic obstructive pulmonary disease with acute lower respiratory infection: Secondary | ICD-10-CM

## 2014-06-06 DIAGNOSIS — B20 Human immunodeficiency virus [HIV] disease: Secondary | ICD-10-CM

## 2014-06-06 DIAGNOSIS — N289 Disorder of kidney and ureter, unspecified: Secondary | ICD-10-CM | POA: Diagnosis not present

## 2014-06-06 DIAGNOSIS — J449 Chronic obstructive pulmonary disease, unspecified: Secondary | ICD-10-CM | POA: Insufficient documentation

## 2014-06-06 HISTORY — DX: Acute bronchitis, unspecified: J20.9

## 2014-06-06 HISTORY — DX: Acute bronchitis, unspecified: J44.0

## 2014-06-06 HISTORY — DX: Disorder of kidney and ureter, unspecified: N28.9

## 2014-06-06 LAB — BASIC METABOLIC PANEL WITH GFR
BUN: 29 mg/dL — ABNORMAL HIGH (ref 6–23)
CALCIUM: 9.2 mg/dL (ref 8.4–10.5)
CHLORIDE: 104 meq/L (ref 96–112)
CO2: 23 meq/L (ref 19–32)
Creat: 1.67 mg/dL — ABNORMAL HIGH (ref 0.50–1.10)
GFR, EST NON AFRICAN AMERICAN: 35 mL/min — AB
GFR, Est African American: 40 mL/min — ABNORMAL LOW
Glucose, Bld: 80 mg/dL (ref 70–99)
Potassium: 4.6 mEq/L (ref 3.5–5.3)
Sodium: 139 mEq/L (ref 135–145)

## 2014-06-06 MED ORDER — ALBUTEROL SULFATE HFA 108 (90 BASE) MCG/ACT IN AERS: 2.0000 | INHALATION_SPRAY | Freq: Four times a day (QID) | RESPIRATORY_TRACT | Status: DC | PRN

## 2014-06-06 MED ORDER — ABACAVIR-DOLUTEGRAVIR-LAMIVUD 600-50-300 MG PO TABS: 1.0000 | ORAL_TABLET | Freq: Every day | ORAL | Status: DC

## 2014-06-06 MED ORDER — FLUTICASONE-SALMETEROL 250-50 MCG/DOSE IN AEPB: 1.0000 | INHALATION_SPRAY | Freq: Two times a day (BID) | RESPIRATORY_TRACT | Status: DC

## 2014-06-06 MED ORDER — PREDNISONE 20 MG PO TABS: 40.0000 mg | ORAL_TABLET | Freq: Every day | ORAL | Status: DC

## 2014-06-06 MED ORDER — DOXYCYCLINE HYCLATE 100 MG PO TABS: 100.0000 mg | ORAL_TABLET | Freq: Two times a day (BID) | ORAL | Status: DC

## 2014-06-06 NOTE — Progress Notes (Signed)
Patient ID: Molly Dillon, female   DOB: 1960-08-03, 54 y.o.   MRN: [id number]   Chronic renal insufficiency with worsening in last several months BEYOND what one would expect with just COBI effect:  We will get her off STRIBILD, COBI and TDF  Will check BMP again today and UA and urine lytes  May need Renal US and or consult if no clear cause identified

## 2014-06-06 NOTE — Progress Notes (Signed)
Subjective:    Patient ID: Molly Dillon, female    DOB: 07/28/1960, 54 y.o.   MRN: [id number]  HPI    Subjective:    Patient ID: Molly Dillon, female    DOB: 1960/05/24, 54 y.o.   MRN: [id number]     54 year old [place] American lady with perfectly controlled HIV on STRIBILD. She has developed worsening creatinine up to 1.8 now.  This is clearly beyond the level one would expect with COBI.  She denies ingesting NSAID's being dehydrated. Her BP does not appear to have been out of control.  Today she presents to clinic with worsening cough with occasional productive sputum wheezing at times dyspnea on exertion. She is out of all her bronchodilators and has not been taking inhaled corticosteroids. She is still smoking unfortunately.  She denies fever chills cough is worsened over the last several weeks.    Lab Results  Component Value Date   HIV1RNAQUANT <20 05/23/2014   Lab Results  Component Value Date   CD4TABS 650 05/23/2014   CD4TABS 660 11/22/2013   CD4TABS 650 03/27/2013          Review of Systems  Constitutional: Negative for fever, chills, diaphoresis, activity change, appetite change, fatigue and unexpected weight change.  HENT: Negative for congestion, rhinorrhea, sinus pressure, sneezing, sore throat and trouble swallowing.   Eyes: Negative for photophobia and visual disturbance.  Respiratory: Positive for cough, shortness of breath and wheezing. Negative for chest tightness and stridor.   Cardiovascular: Negative for chest pain, palpitations and leg swelling.  Gastrointestinal: Negative for nausea, vomiting, abdominal pain, diarrhea, constipation, blood in stool, abdominal distention and anal bleeding.  Genitourinary: Negative for dysuria, hematuria, flank pain and difficulty urinating.  Musculoskeletal: Negative for myalgias, back pain, joint swelling, arthralgias and gait problem.  Skin: Negative for color change, pallor, rash and wound.  Neurological:  Negative for dizziness, tremors, weakness and light-headedness.  Hematological: Negative for adenopathy. Does not bruise/bleed easily.  Psychiatric/Behavioral: Negative for behavioral problems, confusion, sleep disturbance, dysphoric mood, decreased concentration and agitation.       Objective:   Physical Exam  Constitutional: She is oriented to person, place, and time. She appears well-developed and well-nourished. No distress.  HENT:  Head: Normocephalic and atraumatic.  Mouth/Throat: No oropharyngeal exudate.  Eyes: Conjunctivae and EOM are normal. No scleral icterus.  Neck: Normal range of motion. Neck supple.  Cardiovascular: Normal rate and regular rhythm.  Exam reveals no gallop and no friction rub.   No murmur heard. Pulmonary/Chest: Effort normal. No respiratory distress. She has no wheezes. She has rales.  Abdominal: She exhibits no distension.  Musculoskeletal: She exhibits no edema or tenderness.  Neurological: She is alert and oriented to person, place, and time. She exhibits normal muscle tone. Coordination normal.  Skin: Skin is warm and dry. No rash noted. She is not diaphoretic. No erythema. No pallor.  Psychiatric: She has a normal mood and affect. Her behavior is normal. Judgment and thought content normal.          Assessment & Plan:   HIV: Change to [place] to reduce risk of further nephrotoxicity. I also want cobicistat out of the picture. This will make her antiretroviral cleaner with her COPD drugs in terms of drug drug interactions.     COPD: With acute exacerbation we'll give her prednisone for 7 days give her doxycycline as well twice a day for 7 days and I'm starting her back on inhaled quarter steroids in the form  of Advair and a long-acting bronchodilator as well as pro-air which could take up to 4-6 times a day as needed. Emphasized the need to take Advair Diskus regularly to avoid frequent flareups.    Smoking: spent greater than 3 minutes  counseling her to stop smoking  We spent greater than 40 minutes with the patient including greater than 50% of time in face to face counsel of the patient to antiretroviral regimen regarding the methods to treat her COPD both acutely and chronically. and in coordination of their care.

## 2014-06-07 LAB — URINALYSIS, ROUTINE W REFLEX MICROSCOPIC
Bilirubin Urine: NEGATIVE
Glucose, UA: NEGATIVE mg/dL
Ketones, ur: NEGATIVE mg/dL
LEUKOCYTES UA: NEGATIVE
NITRITE: NEGATIVE
Protein, ur: 30 mg/dL — AB
Specific Gravity, Urine: <1.005 — ABNORMAL LOW (ref 1.005–1.030)
UROBILINOGEN UA: 0.2 mg/dL (ref 0.0–1.0)
pH: 5.5 (ref 5.0–8.0)

## 2014-06-07 LAB — URINALYSIS, MICROSCOPIC ONLY
CRYSTALS: NONE SEEN
Casts: NONE SEEN

## 2014-06-07 LAB — CREATININE, URINE, RANDOM: Creatinine, Urine: 97 mg/dL

## 2014-06-07 LAB — SODIUM, URINE, RANDOM: SODIUM UR: 109 meq/L

## 2014-07-05 ENCOUNTER — Other Ambulatory Visit: Payer: Medicare Other

## 2014-07-05 DIAGNOSIS — B20 Human immunodeficiency virus [HIV] disease: Secondary | ICD-10-CM | POA: Diagnosis not present

## 2014-07-05 DIAGNOSIS — N289 Disorder of kidney and ureter, unspecified: Secondary | ICD-10-CM | POA: Diagnosis not present

## 2014-07-05 LAB — COMPLETE METABOLIC PANEL WITH GFR
ALBUMIN: 3.6 g/dL (ref 3.5–5.2)
ALT: 15 U/L (ref 0–35)
AST: 13 U/L (ref 0–37)
Alkaline Phosphatase: 163 U/L — ABNORMAL HIGH (ref 39–117)
BUN: 14 mg/dL (ref 6–23)
CALCIUM: 8.7 mg/dL (ref 8.4–10.5)
CHLORIDE: 109 meq/L (ref 96–112)
CO2: 23 meq/L (ref 19–32)
Creat: 1.63 mg/dL — ABNORMAL HIGH (ref 0.50–1.10)
GFR, EST NON AFRICAN AMERICAN: 36 mL/min — AB
GFR, Est African American: 41 mL/min — ABNORMAL LOW
Glucose, Bld: 85 mg/dL (ref 70–99)
Potassium: 3.8 mEq/L (ref 3.5–5.3)
Sodium: 142 mEq/L (ref 135–145)
Total Bilirubin: 0.3 mg/dL (ref 0.2–1.2)
Total Protein: 6.9 g/dL (ref 6.0–8.3)

## 2014-07-05 LAB — CBC WITH DIFFERENTIAL/PLATELET
Basophils Absolute: 0 K/uL (ref 0.0–0.1)
Basophils Relative: 0 % (ref 0–1)
Eosinophils Absolute: 0.2 K/uL (ref 0.0–0.7)
Eosinophils Relative: 2 % (ref 0–5)
HCT: 36.5 % (ref 36.0–46.0)
Hemoglobin: 12.4 g/dL (ref 12.0–15.0)
Lymphocytes Relative: 39 % (ref 12–46)
Lymphs Abs: 2.9 K/uL (ref 0.7–4.0)
MCH: 31.4 pg (ref 26.0–34.0)
MCHC: 34 g/dL (ref 30.0–36.0)
MCV: 92.4 fL (ref 78.0–100.0)
MPV: 8.7 fL (ref 8.6–12.4)
Monocytes Absolute: 0.7 K/uL (ref 0.1–1.0)
Monocytes Relative: 9 % (ref 3–12)
Neutro Abs: 3.8 K/uL (ref 1.7–7.7)
Neutrophils Relative %: 50 % (ref 43–77)
Platelets: 342 K/uL (ref 150–400)
RBC: 3.95 MIL/uL (ref 3.87–5.11)
RDW: 16.5 % — ABNORMAL HIGH (ref 11.5–15.5)
WBC: 7.5 K/uL (ref 4.0–10.5)

## 2014-07-05 LAB — HEPATITIS C ANTIBODY: HCV Ab: NEGATIVE

## 2014-07-06 LAB — T-HELPER CELL (CD4) - (RCID CLINIC ONLY)
CD4 % Helper T Cell: 17 % — ABNORMAL LOW (ref 33–55)
CD4 T Cell Abs: 500 /uL (ref 400–2700)

## 2014-07-06 LAB — MICROALBUMIN / CREATININE URINE RATIO
CREATININE, URINE: 148 mg/dL
MICROALB UR: 7.6 mg/dL — AB (ref <2.0)
Microalb Creat Ratio: 51.4 mg/g — ABNORMAL HIGH (ref 0.0–30.0)

## 2014-07-08 LAB — HIV-1 RNA QUANT-NO REFLEX-BLD
HIV 1 RNA Quant: <20 copies/mL (ref <20)
HIV-1 RNA Quant, Log: <1.3 {Log} (ref <1.30)

## 2014-07-19 ENCOUNTER — Ambulatory Visit (INDEPENDENT_AMBULATORY_CARE_PROVIDER_SITE_OTHER): Payer: Medicare Other | Admitting: Infectious Disease

## 2014-07-19 ENCOUNTER — Encounter: Payer: Self-pay | Admitting: Infectious Disease

## 2014-07-19 VITALS — BP 154/85 | HR 79 | Temp 98.3°F | Ht 64.0 in | Wt 164.0 lb

## 2014-07-19 DIAGNOSIS — B20 Human immunodeficiency virus [HIV] disease: Secondary | ICD-10-CM

## 2014-07-19 DIAGNOSIS — E785 Hyperlipidemia, unspecified: Secondary | ICD-10-CM

## 2014-07-19 DIAGNOSIS — N183 Chronic kidney disease, stage 3 unspecified: Secondary | ICD-10-CM

## 2014-07-19 DIAGNOSIS — J440 Chronic obstructive pulmonary disease with acute lower respiratory infection: Secondary | ICD-10-CM

## 2014-07-19 DIAGNOSIS — K5909 Other constipation: Secondary | ICD-10-CM | POA: Diagnosis not present

## 2014-07-19 DIAGNOSIS — N182 Chronic kidney disease, stage 2 (mild): Secondary | ICD-10-CM | POA: Diagnosis not present

## 2014-07-19 DIAGNOSIS — J209 Acute bronchitis, unspecified: Secondary | ICD-10-CM

## 2014-07-19 DIAGNOSIS — I10 Essential (primary) hypertension: Secondary | ICD-10-CM

## 2014-07-19 DIAGNOSIS — I129 Hypertensive chronic kidney disease with stage 1 through stage 4 chronic kidney disease, or unspecified chronic kidney disease: Secondary | ICD-10-CM | POA: Diagnosis not present

## 2014-07-19 DIAGNOSIS — K5900 Constipation, unspecified: Secondary | ICD-10-CM

## 2014-07-19 DIAGNOSIS — N181 Chronic kidney disease, stage 1: Secondary | ICD-10-CM

## 2014-07-19 DIAGNOSIS — N189 Chronic kidney disease, unspecified: Secondary | ICD-10-CM

## 2014-07-19 DIAGNOSIS — N184 Chronic kidney disease, stage 4 (severe): Secondary | ICD-10-CM

## 2014-07-19 DIAGNOSIS — N185 Chronic kidney disease, stage 5: Secondary | ICD-10-CM

## 2014-07-19 DIAGNOSIS — J441 Chronic obstructive pulmonary disease with (acute) exacerbation: Secondary | ICD-10-CM

## 2014-07-19 HISTORY — DX: Constipation, unspecified: K59.00

## 2014-07-19 HISTORY — DX: Hypertensive chronic kidney disease with stage 1 through stage 4 chronic kidney disease, or unspecified chronic kidney disease: I12.9

## 2014-07-19 MED ORDER — TRIAMCINOLONE ACETONIDE 0.5 % EX OINT: 1.0000 "application " | TOPICAL_OINTMENT | Freq: Two times a day (BID) | CUTANEOUS | Status: DC

## 2014-07-19 MED ORDER — AMLODIPINE BESYLATE 10 MG PO TABS: 10.0000 mg | ORAL_TABLET | Freq: Every day | ORAL | Status: DC

## 2014-07-19 NOTE — Progress Notes (Signed)
Subjective:    Patient ID: Molly Dillon, female    DOB: 1960/07/01, 54 y.o.   MRN: [id number]  HPI  54 year old with HIV, Hypertension and now progressive worsening kidney disease, likely due to hypertensive nephropathy, asthma and COPD with recent exacerbation sp rx with steroids.  She comes today for routine visit with complaints of new rash on her arms. She has not been placed on any new medicines and not had any new detergents or clear allergic exposures. Rash is pruritic and isolated to dorsum of arms.   Her 2nd major complaint is that she has been suffering from constipation which started ever since we changed her from [facility] to [facility].   She has been taking miralax daily but only having a bowel movement once a week as opposed to daily.    Review of Systems  Constitutional: Negative for fever, chills, diaphoresis, activity change, appetite change, fatigue and unexpected weight change.  HENT: Negative for congestion, rhinorrhea, sinus pressure, sneezing, sore throat and trouble swallowing.   Eyes: Negative for photophobia and visual disturbance.  Respiratory: Negative for cough, chest tightness, shortness of breath, wheezing and stridor.   Cardiovascular: Negative for chest pain, palpitations and leg swelling.  Gastrointestinal: Positive for constipation. Negative for nausea, vomiting, abdominal pain, diarrhea, blood in stool, abdominal distention and anal bleeding.  Genitourinary: Negative for dysuria, hematuria, flank pain and difficulty urinating.  Musculoskeletal: Negative for myalgias, back pain, joint swelling, arthralgias and gait problem.  Skin: Positive for rash. Negative for color change, pallor and wound.  Neurological: Negative for dizziness, tremors, weakness and light-headedness.  Hematological: Negative for adenopathy. Does not bruise/bleed easily.  Psychiatric/Behavioral: Negative for behavioral problems, confusion, sleep disturbance, dysphoric mood, decreased  concentration and agitation.       Objective:   Physical Exam  Constitutional: She is oriented to person, place, and time. She appears well-developed and well-nourished. No distress.  HENT:  Head: Normocephalic and atraumatic.  Mouth/Throat: No oropharyngeal exudate.  Eyes: Conjunctivae and EOM are normal. No scleral icterus.  Neck: Normal range of motion. Neck supple.  Cardiovascular: Normal rate and regular rhythm.   Pulmonary/Chest: Effort normal. No respiratory distress. She has no wheezes.  Abdominal: She exhibits no distension.  Musculoskeletal: She exhibits no edema or tenderness.  Neurological: She is alert and oriented to person, place, and time. She exhibits normal muscle tone. Coordination normal.  Skin: Skin is warm and dry. Rash noted. She is not diaphoretic. No erythema. No pallor.  Psychiatric: She has a normal mood and affect. Her behavior is normal. Judgment and thought content normal.    07/18/13:  Left arm:    Right arm:          Assessment & Plan:   New rash: likely due to an environmental allergen. Start topical triamicinolone   Constipation: conceivably due to removal of COBI (which can cause loose stools), introduce senokot, higher dose of miralax with fluids for now  HIV: would continue the [place]. It would be more "kidney friendly" than a TDF regimen. I am not enthusiastic about switch to [facility] (even though it would be safer with TAF-TNF) due to issues with COBI elevating serum CR and complicating workup for renal disease. Could consider ODEFSEY but would require 400 calorie meal and have a lower barrier to resistance than her current regimen  COPD: continue inhaled corticosteroids and prn broncholdilators.   Hypertensive nephropathy and now CKD: restart amlodipine, make sure she sees PCP and should be plugged in with Nephrology  I spent greater than 40 minutes with the patient including greater than 50% of time in face to face counsel of the  patient re her HIV, COPD, rash, constipation and in coordination of their care.

## 2014-07-19 NOTE — Patient Instructions (Addendum)
Buy Senokot S take 2 tablets daily for 3 days  OK to continue the Miralax with twice a day with plenty of fluids   I am sending in topical steroid for your arms  Please make an appt with your Primary care MD re the constipation and most importantly re your kidney funciton  I would also like you to see a kidney doctor

## 2014-09-12 DIAGNOSIS — H04123 Dry eye syndrome of bilateral lacrimal glands: Secondary | ICD-10-CM | POA: Diagnosis not present

## 2014-09-12 DIAGNOSIS — H35033 Hypertensive retinopathy, bilateral: Secondary | ICD-10-CM | POA: Diagnosis not present

## 2014-09-12 DIAGNOSIS — H524 Presbyopia: Secondary | ICD-10-CM | POA: Diagnosis not present

## 2014-09-12 DIAGNOSIS — H11153 Pinguecula, bilateral: Secondary | ICD-10-CM | POA: Diagnosis not present

## 2014-11-19 ENCOUNTER — Other Ambulatory Visit: Payer: Medicare Other

## 2014-11-19 DIAGNOSIS — E785 Hyperlipidemia, unspecified: Secondary | ICD-10-CM

## 2014-11-19 DIAGNOSIS — K5909 Other constipation: Secondary | ICD-10-CM | POA: Diagnosis not present

## 2014-11-19 DIAGNOSIS — N183 Chronic kidney disease, stage 3 unspecified: Secondary | ICD-10-CM

## 2014-11-19 DIAGNOSIS — B20 Human immunodeficiency virus [HIV] disease: Secondary | ICD-10-CM | POA: Diagnosis not present

## 2014-11-19 LAB — COMPLETE METABOLIC PANEL WITH GFR
ALT: 10 U/L (ref 6–29)
AST: 12 U/L (ref 10–35)
Albumin: 4 g/dL (ref 3.6–5.1)
Alkaline Phosphatase: 138 U/L — ABNORMAL HIGH (ref 33–130)
BILIRUBIN TOTAL: 0.3 mg/dL (ref 0.2–1.2)
BUN: 24 mg/dL (ref 7–25)
CO2: 24 mmol/L (ref 20–31)
Calcium: 9.2 mg/dL (ref 8.6–10.4)
Chloride: 105 mmol/L (ref 98–110)
Creat: 1.47 mg/dL — ABNORMAL HIGH (ref 0.50–1.05)
GFR, EST NON AFRICAN AMERICAN: 40 mL/min — AB (ref >=60)
GFR, Est African American: 47 mL/min — ABNORMAL LOW (ref >=60)
GLUCOSE: 89 mg/dL (ref 65–99)
Potassium: 4 mmol/L (ref 3.5–5.3)
SODIUM: 141 mmol/L (ref 135–146)
TOTAL PROTEIN: 7.4 g/dL (ref 6.1–8.1)

## 2014-11-19 LAB — LIPID PANEL
CHOL/HDL RATIO: 4 ratio (ref <=5.0)
Cholesterol: 197 mg/dL (ref 125–200)
HDL: 49 mg/dL (ref >=46)
LDL CALC: 125 mg/dL (ref <130)
TRIGLYCERIDES: 115 mg/dL (ref <150)
VLDL: 23 mg/dL (ref <30)

## 2014-11-19 LAB — CBC WITH DIFFERENTIAL/PLATELET
BASOS PCT: 1 % (ref 0–1)
Basophils Absolute: 0.1 10*3/uL (ref 0.0–0.1)
Eosinophils Absolute: 0.2 10*3/uL (ref 0.0–0.7)
Eosinophils Relative: 2 % (ref 0–5)
HCT: 44 % (ref 36.0–46.0)
Hemoglobin: 14.6 g/dL (ref 12.0–15.0)
Lymphocytes Relative: 55 % — ABNORMAL HIGH (ref 12–46)
Lymphs Abs: 4.2 10*3/uL — ABNORMAL HIGH (ref 0.7–4.0)
MCH: 31.4 pg (ref 26.0–34.0)
MCHC: 33.2 g/dL (ref 30.0–36.0)
MCV: 94.6 fL (ref 78.0–100.0)
MPV: 9 fL (ref 8.6–12.4)
Monocytes Absolute: 0.5 10*3/uL (ref 0.1–1.0)
Monocytes Relative: 7 % (ref 3–12)
NEUTROS PCT: 35 % — AB (ref 43–77)
Neutro Abs: 2.7 10*3/uL (ref 1.7–7.7)
Platelets: 344 10*3/uL (ref 150–400)
RBC: 4.65 MIL/uL (ref 3.87–5.11)
RDW: 15 % (ref 11.5–15.5)
WBC: 7.7 10*3/uL (ref 4.0–10.5)

## 2014-11-20 LAB — RPR

## 2014-11-20 LAB — HIV-1 RNA QUANT-NO REFLEX-BLD

## 2014-11-20 LAB — T-HELPER CELL (CD4) - (RCID CLINIC ONLY)
CD4 % Helper T Cell: 20 % — ABNORMAL LOW (ref 33–55)
CD4 T CELL ABS: 890 /uL (ref 400–2700)

## 2014-12-03 ENCOUNTER — Ambulatory Visit: Payer: Medicare Other | Admitting: Infectious Disease

## 2014-12-25 ENCOUNTER — Ambulatory Visit: Payer: Medicare Other | Admitting: Infectious Disease

## 2015-01-30 ENCOUNTER — Other Ambulatory Visit: Payer: Medicare Other

## 2015-02-13 ENCOUNTER — Telehealth: Payer: Self-pay | Admitting: *Deleted

## 2015-02-13 ENCOUNTER — Ambulatory Visit (INDEPENDENT_AMBULATORY_CARE_PROVIDER_SITE_OTHER): Payer: Medicare Other | Admitting: Infectious Disease

## 2015-02-13 ENCOUNTER — Encounter: Payer: Self-pay | Admitting: Infectious Disease

## 2015-02-13 VITALS — BP 145/88 | HR 91 | Temp 97.8°F | Ht 64.0 in | Wt 146.0 lb

## 2015-02-13 DIAGNOSIS — I10 Essential (primary) hypertension: Secondary | ICD-10-CM | POA: Diagnosis not present

## 2015-02-13 DIAGNOSIS — Z1231 Encounter for screening mammogram for malignant neoplasm of breast: Secondary | ICD-10-CM | POA: Diagnosis not present

## 2015-02-13 DIAGNOSIS — I129 Hypertensive chronic kidney disease with stage 1 through stage 4 chronic kidney disease, or unspecified chronic kidney disease: Secondary | ICD-10-CM | POA: Diagnosis not present

## 2015-02-13 DIAGNOSIS — Z1239 Encounter for other screening for malignant neoplasm of breast: Secondary | ICD-10-CM | POA: Diagnosis not present

## 2015-02-13 DIAGNOSIS — B20 Human immunodeficiency virus [HIV] disease: Secondary | ICD-10-CM | POA: Diagnosis not present

## 2015-02-13 DIAGNOSIS — R634 Abnormal weight loss: Secondary | ICD-10-CM

## 2015-02-13 HISTORY — DX: Abnormal weight loss: R63.4

## 2015-02-13 LAB — COMPLETE METABOLIC PANEL WITH GFR
ALBUMIN: 4.1 g/dL (ref 3.6–5.1)
ALT: 13 U/L (ref 6–29)
AST: 14 U/L (ref 10–35)
Alkaline Phosphatase: 120 U/L (ref 33–130)
BUN: 23 mg/dL (ref 7–25)
CHLORIDE: 107 mmol/L (ref 98–110)
CO2: 21 mmol/L (ref 20–31)
Calcium: 9.3 mg/dL (ref 8.6–10.4)
Creat: 1.47 mg/dL — ABNORMAL HIGH (ref 0.50–1.05)
GFR, Est African American: 47 mL/min — ABNORMAL LOW (ref >=60)
GFR, Est Non African American: 40 mL/min — ABNORMAL LOW (ref >=60)
GLUCOSE: 81 mg/dL (ref 65–99)
POTASSIUM: 4.6 mmol/L (ref 3.5–5.3)
SODIUM: 140 mmol/L (ref 135–146)
Total Bilirubin: 0.3 mg/dL (ref 0.2–1.2)
Total Protein: 7.4 g/dL (ref 6.1–8.1)

## 2015-02-13 LAB — CBC WITH DIFFERENTIAL/PLATELET
Basophils Absolute: 0.1 10*3/uL (ref 0.0–0.1)
Basophils Relative: 1 % (ref 0–1)
Eosinophils Absolute: 0.1 10*3/uL (ref 0.0–0.7)
Eosinophils Relative: 2 % (ref 0–5)
HCT: 42.5 % (ref 36.0–46.0)
Hemoglobin: 14.1 g/dL (ref 12.0–15.0)
LYMPHS ABS: 2.5 10*3/uL (ref 0.7–4.0)
LYMPHS PCT: 43 % (ref 12–46)
MCH: 31.7 pg (ref 26.0–34.0)
MCHC: 33.2 g/dL (ref 30.0–36.0)
MCV: 95.5 fL (ref 78.0–100.0)
MONO ABS: 0.5 10*3/uL (ref 0.1–1.0)
MONOS PCT: 8 % (ref 3–12)
MPV: 9.1 fL (ref 8.6–12.4)
NEUTROS ABS: 2.6 10*3/uL (ref 1.7–7.7)
NEUTROS PCT: 46 % (ref 43–77)
PLATELETS: 273 10*3/uL (ref 150–400)
RBC: 4.45 MIL/uL (ref 3.87–5.11)
RDW: 15 % (ref 11.5–15.5)
WBC: 5.7 10*3/uL (ref 4.0–10.5)

## 2015-02-13 NOTE — Progress Notes (Signed)
chief complaints:  #1 unintentional weight loss  #2 lower back pain and hip pain and thigh pain  Subjective:    Patient ID: Molly Dillon, female    DOB: 1960/07/05, 55 y.o.   MRN: [id number]  HPI   55 year old with HIV, Hypertension ahypertensive nephropathy, asthma and COPD with recent exacerbation sp rx with steroids.    She is concerned today about the fact that she has lost approximately 20 pounds over last several months.   review her chart it seems that this is happened before where she lost weight and regained it and she cannot give me a good reason why she did lose it and regained it. Looking at her body mass index she is actually at an ideal weight right now. Regardless though it does not mean that we should not work up this unintentional weight loss. She says she has been eating normally and has not increased her activity.  She has not ever had a colonoscopy and her mammography is late by 2 years so we'll schedule her for both of these. She does continue to smoke as well.   finally she's been complaining of some debilitating pain in her lower back and thighs that makes it difficult for her to get up. She says she has tried some ibuprofen with little effect.  Past Medical History  Diagnosis Date  . Asthma   . HIV infection ([place])   . Hypertension   . Depression   . Hyperlipidemia   . Acute renal insufficiency 06/06/2014  . COPD (chronic obstructive pulmonary disease) with acute bronchitis ([place]) 06/06/2014  . Constipation 07/19/2014  . Hypertensive nephropathy 07/19/2014  . Unintentional weight loss 02/13/2015    No past surgical history on file.  No family history on file.    Social History   Social History  . Marital Status: Married    Spouse Name: N/A  . Number of Children: N/A  . Years of Education: N/A   Social History Main Topics  . Smoking status: Light Tobacco Smoker -- 0.00 packs/day    Types: Cigarettes    Last Attempt to Quit: 07/02/2014  . Smokeless  tobacco: Never Used     Comment: down to 2 cigs a day  . Alcohol Use: No  . Drug Use: No  . Sexual Activity: No   Other Topics Concern  . None   Social History Narrative    Allergies  Allergen Reactions  . Dapsone     REACTION: SOB  . Penicillins     REACTION: hives     Current outpatient prescriptions:  .  Abacavir-Dolutegravir-Lamivud (TRIUMEQ) 600-50-300 MG TABS, Take 1 tablet by mouth daily., Disp: 30 tablet, Rfl: 11 .  albuterol (PROVENTIL HFA;VENTOLIN HFA) 108 (90 BASE) MCG/ACT inhaler, Inhale 2 puffs into the lungs every 6 (six) hours as needed for wheezing., Disp: 1 Inhaler, Rfl: 11 .  amLODipine (NORVASC) 10 MG tablet, Take 1 tablet (10 mg total) by mouth daily., Disp: 30 tablet, Rfl: 11 .  Fluticasone-Salmeterol (ADVAIR DISKUS) 250-50 MCG/DOSE AEPB, Inhale 1 puff into the lungs 2 (two) times daily., Disp: 60 each, Rfl: 11 .  mirtazapine (REMERON) 45 MG tablet, Take 1 tablet (45 mg total) by mouth at bedtime., Disp: 30 tablet, Rfl: 11 .  triamcinolone ointment (KENALOG) 0.5 %, Apply 1 application topically 2 (two) times daily., Disp: 30 g, Rfl: 1 .  pravastatin (PRAVACHOL) 20 MG tablet, , Disp: , Rfl: 1   Review of Systems  Constitutional: Positive for  unexpected weight change. Negative for fever, chills, diaphoresis, activity change, appetite change and fatigue.  HENT: Negative for congestion, rhinorrhea, sinus pressure, sneezing, sore throat and trouble swallowing.   Eyes: Negative for photophobia and visual disturbance.  Respiratory: Negative for cough, chest tightness, shortness of breath, wheezing and stridor.   Cardiovascular: Negative for chest pain, palpitations and leg swelling.  Gastrointestinal: Negative for nausea, vomiting, abdominal pain, diarrhea, blood in stool, abdominal distention and anal bleeding.  Genitourinary: Negative for dysuria, hematuria, flank pain and difficulty urinating.  Musculoskeletal: Positive for myalgias, back pain and arthralgias.  Negative for joint swelling and gait problem.  Skin: Negative for color change, pallor and wound.  Neurological: Negative for dizziness, tremors, weakness and light-headedness.  Hematological: Negative for adenopathy. Does not bruise/bleed easily.  Psychiatric/Behavioral: Negative for behavioral problems, confusion, sleep disturbance, dysphoric mood, decreased concentration and agitation.       Objective:   Physical Exam  Constitutional: She is oriented to person, place, and time. She appears well-developed and well-nourished. No distress.  HENT:  Head: Normocephalic and atraumatic.  Mouth/Throat: No oropharyngeal exudate.  Eyes: Conjunctivae and EOM are normal. No scleral icterus.  Neck: Normal range of motion. Neck supple.  Cardiovascular: Normal rate and regular rhythm.   Pulmonary/Chest: Effort normal. No respiratory distress. She has no wheezes.  Abdominal: She exhibits no distension.  Musculoskeletal: She exhibits no edema or tenderness.       Right hip: She exhibits normal range of motion.       Left hip: She exhibits normal range of motion.   She has pain bilaterally with straight leg raises left worse than right difficult to perform internal and external rotation of the hip joint due to the patient's pain though I felt her pain was out of proportion to the exam.  Neurological: She is alert and oriented to person, place, and time. She exhibits normal muscle tone. Coordination normal.  Skin: Skin is warm and dry. She is not diaphoretic. No erythema. No pallor.  Psychiatric: She has a normal mood and affect. Her behavior is normal. Judgment and thought content normal.         Assessment & Plan:     HIV:  Continue TRIUMEQ recheck labs  today and in 6 months.  COPD: continue inhaled corticosteroids and prn broncholdilators.   Hypertensive nephropathy and now CKD: restart amlodipine, SHE NEEDS to see her PCP and should be plugged in with Nephrology  Filed Vitals:    02/13/15 1018  BP: 145/88  Pulse: 91  Temp: 97.8 F (36.6 C)   Lab Results  Component Value Date   CREATININE 1.47* 11/19/2014   CREATININE 1.63* 07/05/2014   CREATININE 1.67* 06/06/2014   Low back and hip pain, leg pain:  I'll check plain films of the hip and pelvis    Unintentional weight gain we will schedule her for colonoscopy screening with GI and also for mammography  I spent greater than 40 minutes with the patient including greater than 50% of time in face to face counsel of the patient re her HIV,  Unintentional weight loss hip thigh and lower back painand in coordination of her are.

## 2015-02-13 NOTE — Telephone Encounter (Signed)
Patient referred to [facility] GI for screening colonoscopy per Dr. Tommy Medal.  She was present for the appointment information, verbalized understanding and acceptance.  Scheduled 2/17 at 9:15 (arrive 9:00) with Dr Laurence Spates.  Will fax notes to [phone]. Landis Gandy, RN

## 2015-02-13 NOTE — Addendum Note (Signed)
Addended by: Dolan Amen D on: 02/13/2015 02:05 PM   Modules accepted: Orders

## 2015-02-14 ENCOUNTER — Other Ambulatory Visit: Payer: Self-pay | Admitting: Infectious Disease

## 2015-02-14 DIAGNOSIS — Z1231 Encounter for screening mammogram for malignant neoplasm of breast: Secondary | ICD-10-CM

## 2015-02-14 LAB — T-HELPER CELL (CD4) - (RCID CLINIC ONLY)
CD4 T CELL ABS: 460 /uL (ref 400–2700)
CD4 T CELL HELPER: 19 % — AB (ref 33–55)

## 2015-02-14 LAB — RPR

## 2015-02-15 NOTE — Telephone Encounter (Signed)
Faxed via epic.

## 2015-02-16 LAB — HIV RNA, RTPCR W/R GT (RTI, PI,INT)
HIV 1 RNA Quant: <20 copies/mL
HIV-1 RNA Quant, Log: <1.3 Log copies/mL

## 2015-02-19 ENCOUNTER — Telehealth: Payer: Self-pay | Admitting: *Deleted

## 2015-02-19 NOTE — Telephone Encounter (Signed)
Left message reminding patient that she needed to contact [facility] at [phone] to get back into care with her PCP, that she has been referred to [facility] GI (was already given the appointment info 2/17 at 9:15 (arrive 9:00) with Dr Laurence Spates).  RN asked her to call back and confirm. Landis Gandy, RN

## 2015-03-12 DIAGNOSIS — R634 Abnormal weight loss: Secondary | ICD-10-CM | POA: Diagnosis not present

## 2015-03-20 DIAGNOSIS — R634 Abnormal weight loss: Secondary | ICD-10-CM | POA: Diagnosis not present

## 2015-03-21 ENCOUNTER — Other Ambulatory Visit: Payer: Self-pay

## 2015-03-21 DIAGNOSIS — Z1231 Encounter for screening mammogram for malignant neoplasm of breast: Secondary | ICD-10-CM

## 2015-04-02 ENCOUNTER — Encounter: Payer: Self-pay | Admitting: Infectious Disease

## 2015-04-02 ENCOUNTER — Ambulatory Visit: Payer: Medicare Other | Admitting: *Deleted

## 2015-04-02 ENCOUNTER — Other Ambulatory Visit: Payer: Self-pay | Admitting: *Deleted

## 2015-04-02 ENCOUNTER — Ambulatory Visit: Payer: Medicare Other | Admitting: Infectious Disease

## 2015-04-02 ENCOUNTER — Ambulatory Visit (INDEPENDENT_AMBULATORY_CARE_PROVIDER_SITE_OTHER): Payer: Medicare Other | Admitting: Infectious Disease

## 2015-04-02 VITALS — BP 153/89 | HR 90 | Temp 98.0°F | Wt 146.0 lb

## 2015-04-02 DIAGNOSIS — J209 Acute bronchitis, unspecified: Secondary | ICD-10-CM

## 2015-04-02 DIAGNOSIS — J440 Chronic obstructive pulmonary disease with acute lower respiratory infection: Secondary | ICD-10-CM

## 2015-04-02 DIAGNOSIS — R634 Abnormal weight loss: Secondary | ICD-10-CM

## 2015-04-02 DIAGNOSIS — F17200 Nicotine dependence, unspecified, uncomplicated: Secondary | ICD-10-CM | POA: Diagnosis not present

## 2015-04-02 DIAGNOSIS — F331 Major depressive disorder, recurrent, moderate: Secondary | ICD-10-CM

## 2015-04-02 DIAGNOSIS — B20 Human immunodeficiency virus [HIV] disease: Secondary | ICD-10-CM | POA: Diagnosis not present

## 2015-04-02 DIAGNOSIS — I129 Hypertensive chronic kidney disease with stage 1 through stage 4 chronic kidney disease, or unspecified chronic kidney disease: Secondary | ICD-10-CM | POA: Diagnosis not present

## 2015-04-02 DIAGNOSIS — F32A Depression, unspecified: Secondary | ICD-10-CM

## 2015-04-02 DIAGNOSIS — I10 Essential (primary) hypertension: Secondary | ICD-10-CM | POA: Diagnosis not present

## 2015-04-02 DIAGNOSIS — F329 Major depressive disorder, single episode, unspecified: Secondary | ICD-10-CM

## 2015-04-02 HISTORY — DX: Major depressive disorder, recurrent, moderate: F33.1

## 2015-04-02 NOTE — BH Specialist Note (Signed)
Counselor met with Molly Dillon in the exam room per Jackie (nurse) for reported possible depression.  Patient was oriented times four with good affect and dress.  Patient was alert and somewhat talkative.  Patient communicated that she would like to make an appointment as things in her life are too much for her to handle.  Patient said that she does not want to gt into it today but does very much want to make an appointment with counselor.  Counselor provided support and encouragement accordingly. Counselor communicated to patient to make an appointment with counselor when she checks out today.  Patient said ok.  Jodi Herring, MA [facility] 

## 2015-04-02 NOTE — Progress Notes (Signed)
chief complaints:  #1 unintentional weight loss  #2 lower back pain and hip pain and thigh pain  Subjective:    Patient ID: Molly Dillon, female    DOB: Apr 15, 1960, 55 y.o.   MRN: [id number]  Depression        This is a chronic problem.  The current episode started more than 1 month ago.   The onset quality is gradual.   The problem occurs every several days.  The problem has been waxing and waning since onset.  Associated symptoms include myalgias.  Associated symptoms include no decreased concentration, no fatigue and no appetite change.   55 year old with HIV, Hypertension ahypertensive nephropathy, asthma and COPD with recent exacerbation sp rx with steroids.  I saw her earlier in February at which time  she was concernedabout the fact that she has lost approximately 20 pounds over last several months.  Review her chart it seems that this is happened before where she lost weight and regained it and she cannot give me a good reason why she did lose it and regained it. Looking at her body mass index she is actually at an ideal weight right now. Regardless though it does not mean that we should not work up this unintentional weight loss.  She has since had a colonoscopy that she reports was normal but not yet had a mammogram.   Her HIV remains under excellent control .  Lab Results  Component Value Date   HIV1RNAQUANT <20 02/13/2015   HIV1RNAQUANT <20 11/19/2014   HIV1RNAQUANT <20 07/05/2014   Lab Results  Component Value Date   CD4TABS 460 02/13/2015   CD4TABS 890 11/19/2014   CD4TABS 500 07/05/2014      Her BP is not well controlled in part because she is out of her amlodipine.  She is suffering from depressive symptoms but is willing to meet with counselor today.     Past Medical History  Diagnosis Date  . Asthma   . HIV infection ([place])   . Hypertension   . Depression   . Hyperlipidemia   . Acute renal insufficiency 06/06/2014  . COPD (chronic obstructive  pulmonary disease) with acute bronchitis ([place]) 06/06/2014  . Constipation 07/19/2014  . Hypertensive nephropathy 07/19/2014  . Unintentional weight loss 02/13/2015    No past surgical history on file.  No family history on file.    Social History   Social History  . Marital Status: Married    Spouse Name: N/A  . Number of Children: N/A  . Years of Education: N/A   Social History Main Topics  . Smoking status: Light Tobacco Smoker -- 0.00 packs/day    Types: Cigarettes    Last Attempt to Quit: 07/02/2014  . Smokeless tobacco: Never Used     Comment: down to 2 cigs a day  . Alcohol Use: No  . Drug Use: No  . Sexual Activity: No   Other Topics Concern  . None   Social History Narrative    Allergies  Allergen Reactions  . Dapsone     REACTION: SOB  . Penicillins     REACTION: hives     Current outpatient prescriptions:  .  Abacavir-Dolutegravir-Lamivud (TRIUMEQ) 600-50-300 MG TABS, Take 1 tablet by mouth daily., Disp: 30 tablet, Rfl: 11 .  albuterol (PROVENTIL HFA;VENTOLIN HFA) 108 (90 BASE) MCG/ACT inhaler, Inhale 2 puffs into the lungs every 6 (six) hours as needed for wheezing. (Patient not taking: Reported on 04/02/2015), Disp: 1 Inhaler, Rfl: 11 .  amLODipine (NORVASC) 10 MG tablet, Take 1 tablet (10 mg total) by mouth daily. (Patient not taking: Reported on 04/02/2015), Disp: 30 tablet, Rfl: 11 .  Fluticasone-Salmeterol (ADVAIR DISKUS) 250-50 MCG/DOSE AEPB, Inhale 1 puff into the lungs 2 (two) times daily. (Patient not taking: Reported on 04/02/2015), Disp: 60 each, Rfl: 11 .  mirtazapine (REMERON) 45 MG tablet, Take 1 tablet (45 mg total) by mouth at bedtime. (Patient not taking: Reported on 04/02/2015), Disp: 30 tablet, Rfl: 11 .  pravastatin (PRAVACHOL) 20 MG tablet, Reported on 04/02/2015, Disp: , Rfl: 1 .  triamcinolone ointment (KENALOG) 0.5 %, Apply 1 application topically 2 (two) times daily. (Patient not taking: Reported on 04/02/2015), Disp: 30 g, Rfl: 1   Review  of Systems  Constitutional: Positive for unexpected weight change. Negative for fever, chills, diaphoresis, activity change, appetite change and fatigue.  HENT: Negative for congestion, rhinorrhea, sinus pressure, sneezing, sore throat and trouble swallowing.   Eyes: Negative for photophobia and visual disturbance.  Respiratory: Positive for cough. Negative for chest tightness, shortness of breath, wheezing and stridor.   Cardiovascular: Negative for chest pain, palpitations and leg swelling.  Gastrointestinal: Negative for nausea, vomiting, abdominal pain, diarrhea, blood in stool, abdominal distention and anal bleeding.  Genitourinary: Negative for dysuria, hematuria, flank pain and difficulty urinating.  Musculoskeletal: Positive for myalgias, back pain and arthralgias. Negative for joint swelling and gait problem.  Skin: Negative for color change, pallor and wound.  Neurological: Negative for dizziness, tremors, weakness and light-headedness.  Hematological: Negative for adenopathy. Does not bruise/bleed easily.  Psychiatric/Behavioral: Positive for depression. Negative for behavioral problems, confusion, sleep disturbance, dysphoric mood, decreased concentration and agitation.       Objective:   Physical Exam  Constitutional: She is oriented to person, place, and time. She appears well-developed and well-nourished. No distress.  HENT:  Head: Normocephalic and atraumatic.  Mouth/Throat: No oropharyngeal exudate.  Eyes: Conjunctivae and EOM are normal. No scleral icterus.  Neck: Normal range of motion. Neck supple.  Cardiovascular: Normal rate, regular rhythm and normal heart sounds.  Exam reveals no gallop and no friction rub.   No murmur heard. Pulmonary/Chest: Effort normal and breath sounds normal. No respiratory distress. She has no wheezes. She has no rales.  Abdominal: Soft. She exhibits no distension. There is no tenderness.  Musculoskeletal: She exhibits no edema or  tenderness.       Right hip: She exhibits normal range of motion.       Left hip: She exhibits normal range of motion.     Neurological: She is alert and oriented to person, place, and time. She exhibits normal muscle tone. Coordination normal.  Skin: Skin is warm and dry. She is not diaphoretic. No erythema. No pallor.  Psychiatric: She has a normal mood and affect. Her behavior is normal. Judgment and thought content normal.         Assessment & Plan:     HIV:  Continue TRIUMEQ recheck labs  today and in 6 months.  COPD: continue inhaled corticosteroids and prn broncholdilators.   Hypertensive nephropathy and now CKD: restart amlodipine, SHE NEEDS to see a PCP and should be plugged in with Nephrology. She was willing to see Dr. Jule Ser R1 who is working with me this month.   Filed Vitals:   04/02/15 1626  BP: 153/89  Pulse: 90  Temp: 98 F (36.7 C)   Lab Results  Component Value Date   CREATININE 1.47* 02/13/2015   CREATININE 1.47* 11/19/2014  CREATININE 1.63* 07/05/2014   Unintentional weight gain: colonoscopy normal per her report, mammograph pending. Likely much of this is related to her depression  Smoking: again counseled re smoking cessation   Depression: I am having her meet with counselor today  I spent greater than 25 minutes with the patient including greater than 50% of time in face to face counsel of the patient re her HIV,  Unintentional weight loss hypertensive nephropathy, depression, smoking and in coordination of her are.

## 2015-04-04 ENCOUNTER — Ambulatory Visit
Admission: RE | Admit: 2015-04-04 | Discharge: 2015-04-04 | Disposition: A | Discharge status: Home / Self Care | Payer: Medicare Other | Source: Ambulatory Visit | Attending: Infectious Disease | Admitting: Infectious Disease

## 2015-04-04 DIAGNOSIS — Z1231 Encounter for screening mammogram for malignant neoplasm of breast: Secondary | ICD-10-CM | POA: Diagnosis not present

## 2015-05-02 NOTE — Telephone Encounter (Signed)
Error

## 2015-07-03 ENCOUNTER — Other Ambulatory Visit: Payer: Self-pay | Admitting: *Deleted

## 2015-07-03 DIAGNOSIS — I10 Essential (primary) hypertension: Secondary | ICD-10-CM

## 2015-07-03 DIAGNOSIS — F329 Major depressive disorder, single episode, unspecified: Secondary | ICD-10-CM

## 2015-07-03 DIAGNOSIS — R21 Rash and other nonspecific skin eruption: Secondary | ICD-10-CM

## 2015-07-03 DIAGNOSIS — J4520 Mild intermittent asthma, uncomplicated: Secondary | ICD-10-CM

## 2015-07-03 DIAGNOSIS — F32A Depression, unspecified: Secondary | ICD-10-CM

## 2015-07-03 DIAGNOSIS — B20 Human immunodeficiency virus [HIV] disease: Secondary | ICD-10-CM

## 2015-07-03 MED ORDER — FLUTICASONE-SALMETEROL 250-50 MCG/DOSE IN AEPB: 1.0000 | INHALATION_SPRAY | Freq: Two times a day (BID) | RESPIRATORY_TRACT | Status: DC

## 2015-07-03 MED ORDER — ABACAVIR-DOLUTEGRAVIR-LAMIVUD 600-50-300 MG PO TABS: 1.0000 | ORAL_TABLET | Freq: Every day | ORAL | Status: DC

## 2015-07-03 MED ORDER — AMLODIPINE BESYLATE 10 MG PO TABS: 10.0000 mg | ORAL_TABLET | Freq: Every day | ORAL | Status: DC

## 2015-07-03 MED ORDER — TRIAMCINOLONE ACETONIDE 0.5 % EX OINT: 1.0000 "application " | TOPICAL_OINTMENT | Freq: Two times a day (BID) | CUTANEOUS | Status: DC

## 2015-07-03 MED ORDER — MIRTAZAPINE 45 MG PO TABS: 45.0000 mg | ORAL_TABLET | Freq: Every day | ORAL | Status: DC

## 2015-07-03 MED ORDER — ALBUTEROL SULFATE HFA 108 (90 BASE) MCG/ACT IN AERS: 2.0000 | INHALATION_SPRAY | Freq: Four times a day (QID) | RESPIRATORY_TRACT | Status: DC | PRN

## 2015-07-23 ENCOUNTER — Other Ambulatory Visit: Payer: Medicare Other

## 2015-07-23 ENCOUNTER — Other Ambulatory Visit (HOSPITAL_COMMUNITY)
Admission: RE | Admit: 2015-07-23 | Discharge: 2015-07-23 | Disposition: A | Discharge status: Home / Self Care | Payer: Medicare Other | Source: Ambulatory Visit | Attending: Infectious Disease | Admitting: Infectious Disease

## 2015-07-23 DIAGNOSIS — N183 Chronic kidney disease, stage 3 unspecified: Secondary | ICD-10-CM

## 2015-07-23 DIAGNOSIS — K5909 Other constipation: Secondary | ICD-10-CM

## 2015-07-23 DIAGNOSIS — D126 Benign neoplasm of colon, unspecified: Secondary | ICD-10-CM | POA: Diagnosis not present

## 2015-07-23 DIAGNOSIS — E785 Hyperlipidemia, unspecified: Secondary | ICD-10-CM

## 2015-07-23 DIAGNOSIS — B20 Human immunodeficiency virus [HIV] disease: Secondary | ICD-10-CM | POA: Diagnosis not present

## 2015-07-24 LAB — URINE CYTOLOGY ANCILLARY ONLY
CHLAMYDIA, DNA PROBE: NEGATIVE
Neisseria Gonorrhea: NEGATIVE

## 2015-07-24 LAB — CBC WITH DIFFERENTIAL/PLATELET
BASOS PCT: 1 %
Basophils Absolute: 61 cells/uL (ref 0–200)
EOS ABS: 183 {cells}/uL (ref 15–500)
Eosinophils Relative: 3 %
HEMATOCRIT: 44.6 % (ref 35.0–45.0)
HEMOGLOBIN: 15.2 g/dL (ref 11.7–15.5)
LYMPHS ABS: 2867 {cells}/uL (ref 850–3900)
Lymphocytes Relative: 47 %
MCH: 32.4 pg (ref 27.0–33.0)
MCHC: 34.1 g/dL (ref 32.0–36.0)
MCV: 95.1 fL (ref 80.0–100.0)
MONO ABS: 427 {cells}/uL (ref 200–950)
MPV: 9.3 fL (ref 7.5–12.5)
Monocytes Relative: 7 %
NEUTROS PCT: 42 %
Neutro Abs: 2562 cells/uL (ref 1500–7800)
Platelets: 305 10*3/uL (ref 140–400)
RBC: 4.69 MIL/uL (ref 3.80–5.10)
RDW: 16.3 % — ABNORMAL HIGH (ref 11.0–15.0)
WBC: 6.1 10*3/uL (ref 3.8–10.8)

## 2015-07-24 LAB — T-HELPER CELL (CD4) - (RCID CLINIC ONLY)
CD4 % Helper T Cell: 19 % — ABNORMAL LOW (ref 33–55)
CD4 T Cell Abs: 570 /uL (ref 400–2700)

## 2015-07-24 LAB — COMPLETE METABOLIC PANEL WITH GFR
ALBUMIN: 4.3 g/dL (ref 3.6–5.1)
ALK PHOS: 106 U/L (ref 33–130)
ALT: 14 U/L (ref 6–29)
AST: 17 U/L (ref 10–35)
BUN: 23 mg/dL (ref 7–25)
CALCIUM: 9.3 mg/dL (ref 8.6–10.4)
CHLORIDE: 104 mmol/L (ref 98–110)
CO2: 25 mmol/L (ref 20–31)
Creat: 1.27 mg/dL — ABNORMAL HIGH (ref 0.50–1.05)
GFR, EST AFRICAN AMERICAN: 56 mL/min — AB (ref >=60)
GFR, EST NON AFRICAN AMERICAN: 48 mL/min — AB (ref >=60)
Glucose, Bld: 93 mg/dL (ref 65–99)
POTASSIUM: 4.3 mmol/L (ref 3.5–5.3)
Sodium: 140 mmol/L (ref 135–146)
Total Bilirubin: 0.4 mg/dL (ref 0.2–1.2)
Total Protein: 7.5 g/dL (ref 6.1–8.1)

## 2015-07-24 LAB — RPR

## 2015-07-24 LAB — MICROALBUMIN / CREATININE URINE RATIO
CREATININE, URINE: 145 mg/dL (ref 20–320)
MICROALB UR: 31.5 mg/dL
Microalb Creat Ratio: 217 mcg/mg creat — ABNORMAL HIGH (ref <30)

## 2015-07-24 LAB — HIV-1 RNA QUANT-NO REFLEX-BLD: HIV-1 RNA Quant, Log: <1.3 Log copies/mL (ref <1.30)

## 2015-08-06 ENCOUNTER — Ambulatory Visit (INDEPENDENT_AMBULATORY_CARE_PROVIDER_SITE_OTHER): Payer: Medicare Other | Admitting: Infectious Disease

## 2015-08-06 ENCOUNTER — Encounter: Payer: Self-pay | Admitting: Infectious Disease

## 2015-08-06 VITALS — BP 122/80 | HR 82 | Temp 98.0°F | Ht 64.0 in | Wt 140.5 lb

## 2015-08-06 DIAGNOSIS — F331 Major depressive disorder, recurrent, moderate: Secondary | ICD-10-CM | POA: Diagnosis not present

## 2015-08-06 DIAGNOSIS — F17200 Nicotine dependence, unspecified, uncomplicated: Secondary | ICD-10-CM

## 2015-08-06 DIAGNOSIS — B20 Human immunodeficiency virus [HIV] disease: Secondary | ICD-10-CM

## 2015-08-06 DIAGNOSIS — E785 Hyperlipidemia, unspecified: Secondary | ICD-10-CM | POA: Diagnosis not present

## 2015-08-06 DIAGNOSIS — J440 Chronic obstructive pulmonary disease with acute lower respiratory infection: Secondary | ICD-10-CM

## 2015-08-06 DIAGNOSIS — R634 Abnormal weight loss: Secondary | ICD-10-CM | POA: Diagnosis not present

## 2015-08-06 DIAGNOSIS — J209 Acute bronchitis, unspecified: Secondary | ICD-10-CM

## 2015-08-06 DIAGNOSIS — I10 Essential (primary) hypertension: Secondary | ICD-10-CM

## 2015-08-06 NOTE — Progress Notes (Signed)
Subjective:    Patient ID: Molly Dillon, female    DOB: September 21, 1960, 55 y.o.   MRN: [id number]  Depression         This is a chronic problem.  The current episode started more than 1 month ago.   The onset quality is gradual.   The problem occurs every several days.  The problem has been waxing and waning since onset.  Associated symptoms include no decreased concentration, no fatigue, no appetite change and no myalgias.  #1 unintentional weight loss  55 year old with HIV, Hypertension ahypertensive nephropathy, asthma and COPD   We had worked her up for weight loss with  colonoscopy that she reports was normal but not yet had a mammogram.   I had not initated further worukp given that her weight on retrospect has fluctuated between where she is now and where she has been before.   Her HIV remains under excellent control .  Lab Results  Component Value Date   HIV1RNAQUANT <20 07/23/2015   HIV1RNAQUANT <20 02/13/2015   HIV1RNAQUANT <20 11/19/2014   Lab Results  Component Value Date   CD4TABS 570 07/23/2015   CD4TABS 460 02/13/2015   CD4TABS 890 11/19/2014        Past Medical History:  Diagnosis Date  . Acute renal insufficiency 06/06/2014  . Asthma   . Constipation 07/19/2014  . COPD (chronic obstructive pulmonary disease) with acute bronchitis ([place]) 06/06/2014  . Depression   . Depression, major, recurrent, moderate ([place]) 04/02/2015  . HIV infection ([place])   . Hyperlipidemia   . Hypertension   . Hypertensive nephropathy 07/19/2014  . Unintentional weight loss 02/13/2015    No past surgical history on file.  No family history on file.    Social History   Social History  . Marital status: Married    Spouse name: N/A  . Number of children: N/A  . Years of education: N/A   Social History Main Topics  . Smoking status: Light Tobacco Smoker    Packs/day: 0.25    Years: 20.00    Types: Cigarettes    Last attempt to quit: 07/02/2014  . Smokeless tobacco:  Never Used     Comment: down to 5 cigs a day  . Alcohol use None  . Drug use: Unknown  . Sexual activity: Yes    Partners: Male    Birth control/ protection: Condom     Comment: given condoms   Other Topics Concern  . None   Social History Narrative  . None    Allergies  Allergen Reactions  . Dapsone     REACTION: SOB  . Penicillins     REACTION: hives     Current Outpatient Prescriptions:  .  abacavir-dolutegravir-lamiVUDine (TRIUMEQ) 600-50-300 MG tablet, Take 1 tablet by mouth daily., Disp: 30 tablet, Rfl: 5 .  albuterol (PROVENTIL HFA;VENTOLIN HFA) 108 (90 Base) MCG/ACT inhaler, Inhale 2 puffs into the lungs every 6 (six) hours as needed for wheezing., Disp: 1 Inhaler, Rfl: 11 .  amLODipine (NORVASC) 10 MG tablet, Take 1 tablet (10 mg total) by mouth daily., Disp: 30 tablet, Rfl: 5 .  Fluticasone-Salmeterol (ADVAIR DISKUS) 250-50 MCG/DOSE AEPB, Inhale 1 puff into the lungs 2 (two) times daily., Disp: 60 each, Rfl: 5 .  mirtazapine (REMERON) 45 MG tablet, Take 1 tablet (45 mg total) by mouth at bedtime., Disp: 30 tablet, Rfl: 5 .  Multiple Vitamin (MULTIVITAMIN) tablet, Take 1 tablet by mouth daily., Disp: ,  Rfl:  .  pravastatin (PRAVACHOL) 20 MG tablet, Reported on 04/02/2015, Disp: , Rfl: 1 .  triamcinolone ointment (KENALOG) 0.5 %, Apply 1 application topically 2 (two) times daily., Disp: 30 g, Rfl: 3   Review of Systems  Constitutional: Positive for unexpected weight change. Negative for activity change, appetite change, chills, diaphoresis, fatigue and fever.  HENT: Negative for congestion, rhinorrhea, sinus pressure, sneezing, sore throat and trouble swallowing.   Eyes: Negative for photophobia and visual disturbance.  Respiratory: Negative for cough, chest tightness, shortness of breath, wheezing and stridor.   Cardiovascular: Negative for chest pain, palpitations and leg swelling.  Gastrointestinal: Negative for abdominal distention, abdominal pain, anal bleeding,  blood in stool, diarrhea, nausea and vomiting.  Genitourinary: Negative for difficulty urinating, dysuria, flank pain and hematuria.  Musculoskeletal: Positive for back pain. Negative for arthralgias, gait problem, joint swelling and myalgias.  Skin: Negative for color change, pallor and wound.  Neurological: Negative for dizziness, tremors, weakness and light-headedness.  Hematological: Negative for adenopathy. Does not bruise/bleed easily.  Psychiatric/Behavioral: Positive for depression. Negative for agitation, behavioral problems, confusion, decreased concentration, dysphoric mood and sleep disturbance.       Objective:   Physical Exam  Constitutional: She is oriented to person, place, and time. She appears well-developed and well-nourished. No distress.  HENT:  Head: Normocephalic and atraumatic.  Mouth/Throat: No oropharyngeal exudate.  Eyes: Conjunctivae and EOM are normal. No scleral icterus.  Neck: Normal range of motion. Neck supple.  Cardiovascular: Normal rate, regular rhythm and normal heart sounds.  Exam reveals no gallop and no friction rub.   No murmur heard. Pulmonary/Chest: Effort normal and breath sounds normal. No respiratory distress. She has no wheezes. She has no rales.  Abdominal: Soft. She exhibits no distension. There is no tenderness.  Musculoskeletal: She exhibits no edema or tenderness.       Right hip: She exhibits normal range of motion.       Left hip: She exhibits normal range of motion.     Neurological: She is alert and oriented to person, place, and time. She exhibits normal muscle tone. Coordination normal.  Skin: Skin is warm and dry. She is not diaphoretic. No erythema. No pallor.  Psychiatric: She has a normal mood and affect. Her behavior is normal. Judgment and thought content normal.         Assessment & Plan:     HIV:  Continue TRIUMEQ recheck labs  today and in 6 months.  COPD: continue inhaled corticosteroids and prn  broncholdilators.   Hypertensive nephropathy and now CKD: continue amlodipine,  Vitals:   08/06/15 1358  BP: 122/80  Pulse: 82  Temp: 98 F (36.7 C)   Lab Results  Component Value Date   CREATININE 1.27 (H) 07/23/2015   CREATININE 1.47 (H) 02/13/2015   CREATININE 1.47 (H) 11/19/2014   Unintentional weight loss: colonoscopy normal per her report, mammograph pending. Likely much of this is related to her depression and or something else that is not due to an ominous systemic medical problem  Smoking: again counseled re smoking cessation  Depression: she did not wish to see counselor today  I spent greater than 25 minutes with the patient including greater than 50% of time in face to face counsel of the patient re her HIV,  Unintentional weight loss hypertensive nephropathy, depression, smoking and in coordination of her care.

## 2015-08-23 ENCOUNTER — Ambulatory Visit (INDEPENDENT_AMBULATORY_CARE_PROVIDER_SITE_OTHER): Payer: Medicare Other | Admitting: *Deleted

## 2015-08-23 ENCOUNTER — Other Ambulatory Visit (HOSPITAL_COMMUNITY)
Admission: RE | Admit: 2015-08-23 | Discharge: 2015-08-23 | Disposition: A | Discharge status: Home / Self Care | Payer: Medicare Other | Source: Ambulatory Visit | Attending: Infectious Disease | Admitting: Infectious Disease

## 2015-08-23 DIAGNOSIS — Z23 Encounter for immunization: Secondary | ICD-10-CM

## 2015-08-23 DIAGNOSIS — Z124 Encounter for screening for malignant neoplasm of cervix: Secondary | ICD-10-CM

## 2015-08-23 DIAGNOSIS — Z01419 Encounter for gynecological examination (general) (routine) without abnormal findings: Secondary | ICD-10-CM | POA: Insufficient documentation

## 2015-08-23 NOTE — Patient Instructions (Signed)
Your results will be ready in about a week.  I will mail the to you.  Thank you for coming to the Center for your care,  Langley Gauss, [place]

## 2015-08-23 NOTE — Progress Notes (Signed)
Subjective:     Molly Dillon is a 55 y.o. woman who comes in today for a  pap smear only. Previous abnormal Pap smears: no. Contraception: Condoms   Objective:    LMP 01/12/2011  Pelvic Exam: . Pap smear obtained.   Assessment:    Screening pap smear.   Plan:    Follow up in one year, or as indicated by Pap results.   Pt given educational materials re: HIV and women, self-esteem, BSE, nutrition and diet management, PAP smears and partner safety. Pt given condoms

## 2015-08-23 NOTE — Addendum Note (Signed)
Addended by: Lorne Skeens D on: 08/23/2015 11:48 AM   Modules accepted: Orders

## 2015-08-27 LAB — CYTOLOGY - PAP

## 2015-08-30 ENCOUNTER — Encounter: Payer: Self-pay | Admitting: *Deleted

## 2015-10-28 ENCOUNTER — Encounter: Payer: Self-pay | Admitting: Infectious Disease

## 2015-10-29 NOTE — Telephone Encounter (Signed)
Per Dr. Tommy Medal:  Lets get a CT chest, abdomen and pelvis with contrast. This should be a quick way of looking for a cancer. Has she had a mammogram and colonoscopy?  Patient has [facility], [facility] is listed as her PCP. These orders need authorization by her PCP.  She has never established care with them. RN spoke with Kennyth Lose, new intake at [facility], relayed Dr. Derek Mound message. They will see the patient 10/18 at 3:00 to establish care.  Patient is up to date on mammogram (03/2015), PAP smear (08/2015), and colonoscopy (03/2015). All normal results.   Patient notified of plan and appointment with Family Practice, verbalized understanding and agreement. Landis Gandy, RN

## 2015-10-30 ENCOUNTER — Ambulatory Visit (INDEPENDENT_AMBULATORY_CARE_PROVIDER_SITE_OTHER): Payer: Medicare Other | Admitting: Family Medicine

## 2015-10-30 ENCOUNTER — Encounter: Payer: Self-pay | Admitting: Family Medicine

## 2015-10-30 VITALS — BP 132/84 | HR 85 | Temp 98.1°F | Ht 64.0 in | Wt 143.2 lb

## 2015-10-30 DIAGNOSIS — R7309 Other abnormal glucose: Secondary | ICD-10-CM | POA: Diagnosis not present

## 2015-10-30 DIAGNOSIS — F17200 Nicotine dependence, unspecified, uncomplicated: Secondary | ICD-10-CM | POA: Diagnosis not present

## 2015-10-30 DIAGNOSIS — Z0000 Encounter for general adult medical examination without abnormal findings: Secondary | ICD-10-CM

## 2015-10-30 DIAGNOSIS — Z23 Encounter for immunization: Secondary | ICD-10-CM

## 2015-10-30 LAB — POCT GLYCOSYLATED HEMOGLOBIN (HGB A1C): Hemoglobin A1C: 5.5

## 2015-10-30 MED ORDER — TETANUS-DIPHTH-ACELL PERTUSSIS 5-2-15.5 LF-MCG/0.5 IM SUSP: 0.5000 mL | Freq: Once | INTRAMUSCULAR | 0 refills | Status: AC

## 2015-10-30 NOTE — Assessment & Plan Note (Addendum)
Has been seeing ID for HIV which seems to be well controlled. Up to date on colonoscopy, mammogram, and PAP. Due for Tdap and annual flu shot. BP well controlled on Norvasc 10 mg QD. Previous lipid panel wnl on pravastatin 20 mg QD, next scheduled lipid panel for 11/17. - Flu shot given - Tdap Rx given, patient will go to pharmacy for shot - A1C screen neg for diabetes - Continue f/u with ID for HIV tx - F/u in 3 months or sooner if needed

## 2015-10-30 NOTE — Patient Instructions (Addendum)
It was a pleasure to meet you today. Please see below to review our plan for today's visit.  1. You are up to date on your annual flu shot. 2. You will be notified of you lab work results. 3. Please let the front desk know you would like to be seen by Dr. Valentina Lucks (pharmacist) for smoking. They will schedule you an appointment. 4. Follow up in 3 months.  Please call the clinic at [phone] if your symptoms worsen or you have any concerns. It was my pleasure to see you. -- Harriet Butte, [place], PGY-1  Smoking Cessation, Tips for Success If you are ready to quit smoking, congratulations! You have chosen to help yourself be healthier. Cigarettes bring nicotine, tar, carbon monoxide, and other irritants into your body. Your lungs, heart, and blood vessels will be able to work better without these poisons. There are many different ways to quit smoking. Nicotine gum, nicotine patches, a nicotine inhaler, or nicotine nasal spray can help with physical craving. Hypnosis, support groups, and medicines help break the habit of smoking. WHAT THINGS CAN I DO TO MAKE QUITTING EASIER?  Here are some tips to help you quit for good:  Pick a date when you will quit smoking completely. Tell all of your friends and family about your plan to quit on that date.  Do not try to slowly cut down on the number of cigarettes you are smoking. Pick a quit date and quit smoking completely starting on that day.  Throw away all cigarettes.   Clean and remove all ashtrays from your home, work, and car.  On a card, write down your reasons for quitting. Carry the card with you and read it when you get the urge to smoke.  Cleanse your body of nicotine. Drink enough water and fluids to keep your urine clear or pale yellow. Do this after quitting to flush the nicotine from your body.  Learn to predict your moods. Do not let a bad situation be your excuse to have a cigarette. Some situations in  your life might tempt you into wanting a cigarette.  Never have "just one" cigarette. It leads to wanting another and another. Remind yourself of your decision to quit.  Change habits associated with smoking. If you smoked while driving or when feeling stressed, try other activities to replace smoking. Stand up when drinking your coffee. Brush your teeth after eating. Sit in a different chair when you read the paper. Avoid alcohol while trying to quit, and try to drink fewer caffeinated beverages. Alcohol and caffeine may urge you to smoke.  Avoid foods and drinks that can trigger a desire to smoke, such as sugary or spicy foods and alcohol.  Ask people who smoke not to smoke around you.  Have something planned to do right after eating or having a cup of coffee. For example, plan to take a walk or exercise.  Try a relaxation exercise to calm you down and decrease your stress. Remember, you may be tense and nervous for the first 2 weeks after you quit, but this will pass.  Find new activities to keep your hands busy. Play with a pen, coin, or rubber band. Doodle or draw things on paper.  Brush your teeth right after eating. This will help cut down on the craving for the taste of tobacco after meals. You can also try mouthwash.   Use oral substitutes in place of cigarettes. Try using lemon drops, carrots, cinnamon  sticks, or chewing gum. Keep them handy so they are available when you have the urge to smoke.  When you have the urge to smoke, try deep breathing.  Designate your home as a nonsmoking area.  If you are a heavy smoker, ask your health care provider about a prescription for nicotine chewing gum. It can ease your withdrawal from nicotine.  Reward yourself. Set aside the cigarette money you save and buy yourself something nice.  Look for support from others. Join a support group or smoking cessation program. Ask someone at home or at work to help you with your plan to quit  smoking.  Always ask yourself, "Do I need this cigarette or is this just a reflex?" Tell yourself, "Today, I choose not to smoke," or "I do not want to smoke." You are reminding yourself of your decision to quit.  Do not replace cigarette smoking with electronic cigarettes (commonly called e-cigarettes). The safety of e-cigarettes is unknown, and some may contain harmful chemicals.  If you relapse, do not give up! Plan ahead and think about what you will do the next time you get the urge to smoke. HOW WILL I FEEL WHEN I QUIT SMOKING? You may have symptoms of withdrawal because your body is used to nicotine (the addictive substance in cigarettes). You may crave cigarettes, be irritable, feel very hungry, cough often, get headaches, or have difficulty concentrating. The withdrawal symptoms are only temporary. They are strongest when you first quit but will go away within 10-14 days. When withdrawal symptoms occur, stay in control. Think about your reasons for quitting. Remind yourself that these are signs that your body is healing and getting used to being without cigarettes. Remember that withdrawal symptoms are easier to treat than the major diseases that smoking can cause.  Even after the withdrawal is over, expect periodic urges to smoke. However, these cravings are generally short lived and will go away whether you smoke or not. Do not smoke! WHAT RESOURCES ARE AVAILABLE TO HELP ME QUIT SMOKING? Your health care provider can direct you to community resources or hospitals for support, which may include:  Group support.  Education.  Hypnosis.  Therapy.   This information is not intended to replace advice given to you by your health care provider. Make sure you discuss any questions you have with your health care provider.   Document Released: 09/27/2003 Document Revised: 01/19/2014 Document Reviewed: 06/16/2012 Elsevier Interactive Patient Education [employer].

## 2015-10-30 NOTE — Progress Notes (Signed)
Subjective:   Patient ID: Molly Dillon    DOB: 13-Jul-1960, 55 y.o. female   MRN: [id number]  CC: Establishing Care  HPI: Molly Dillon is a 55 y.o. female who presents to clinic today for establishing new patient care from [facility]. Problems discussed today are as follows:  1. Preventative Care: formerly all care provided by infectious disease for h/o HIV. Patient says she was referred to [facility] because she needed to establish a PCP. Will continue being seen by ID for HIV which is well controlled. Has received her screening colonoscopy, PAP, and mammogram over the past year. Wants flu shot today. No other concerns.  2. COPD: uses albuterol and Advair twice per day. Rarely uses albuterol at night. Continues to smoke 2 cig per day. No previous hospitalizations for COPD exacerbations. Never needed O2 therapy. Says she does not struggle with dyspnea and feels her COPD is well controlled. Does have sinus congestion over past 2 weeks. Seems to be improving. Denies fevers, cough, nausea, vomiting, chest pain, dyspnea, lightheadedness.  ROS: See HPI for pertinent ROS.  [place]: Pertinent past medical, surgical, family, and social history were reviewed and updated as appropriate. Smoking status reviewed.  Medications reviewed. Current Outpatient Prescriptions  Medication Sig Dispense Refill  . abacavir-dolutegravir-lamiVUDine (TRIUMEQ) 600-50-300 MG tablet Take 1 tablet by mouth daily. 30 tablet 5  . albuterol (PROVENTIL HFA;VENTOLIN HFA) 108 (90 Base) MCG/ACT inhaler Inhale 2 puffs into the lungs every 6 (six) hours as needed for wheezing. 1 Inhaler 11  . amLODipine (NORVASC) 10 MG tablet Take 1 tablet (10 mg total) by mouth daily. 30 tablet 5  . Fluticasone-Salmeterol (ADVAIR DISKUS) 250-50 MCG/DOSE AEPB Inhale 1 puff into the lungs 2 (two) times daily. 60 each 5  . mirtazapine (REMERON) 45 MG tablet Take 1 tablet (45 mg total) by mouth at bedtime. 30 tablet 5  .  Multiple Vitamin (MULTIVITAMIN) tablet Take 1 tablet by mouth daily.    . pravastatin (PRAVACHOL) 20 MG tablet Reported on 04/02/2015  1  . triamcinolone ointment (KENALOG) 0.5 % Apply 1 application topically 2 (two) times daily. 30 g 3  . Tdap (ADACEL) 05-13-13.5 LF-MCG/0.5 injection Inject 0.5 mLs into the muscle once. 0.5 mL 0   No current facility-administered medications for this visit.     Objective:   BP 132/84   Pulse 85   Temp 98.1 F (36.7 C) (Oral)   Ht 5\' 4"  (1.626 m)   Wt 143 lb 3.2 oz (65 kg)   LMP 01/12/2011   SpO2 99%   BMI 24.58 kg/m  Vitals and nursing note reviewed.  General: well nourished, well developed, in no acute distress with non-toxic appearance HEENT: normocephalic, atraumatic, moist mucous membranes Neck: supple, non-tender without lymphadenopathy CV: regular rate and rhythm without murmurs, rubs, or gallops Lungs: clear to auscultation bilaterally with normal work of breathing Abdomen: soft, non-tender, non-distended, no masses or organomegaly palpable, normoactive bowel sounds Skin: warm, dry, no rashes or lesions, cap refill < 2 seconds Extremities: warm and well perfused, normal tone  Assessment & Plan:   Current every day smoker Has 20 pack year history. Currently smokes 2 cigs per day. COPD stable on Albuterol inhaler and Advair. Seems to be motivated to quit. Stress is trigger for cig use. Has not tried therapies before. - Encouraged to cut down to 1 cig per day then every other day over the course of the next few months - Patient to meet with Dr. Valentina Lucks for  smoking cessation   Encounter for screening and preventative care Has been seeing ID for HIV which seems to be well controlled. Up to date on colonoscopy, mammogram, and PAP. Due for Tdap and annual flu shot. BP well controlled on Norvasc 10 mg QD. Previous lipid panel wnl on pravastatin 20 mg QD, next scheduled lipid panel for 11/17. - Flu shot given - Tdap Rx given, patient will go to  pharmacy for shot - A1C screen neg for diabetes - Continue f/u with ID for HIV tx - F/u in 3 months or sooner if needed  Orders Placed This Encounter  Procedures  . Flu Vaccine QUAD 36+ mos IM  . POCT glycosylated hemoglobin (Hb A1C)   Meds ordered this encounter  Medications  . Tdap (ADACEL) 05-13-13.5 LF-MCG/0.5 injection    Sig: Inject 0.5 mLs into the muscle once.    Dispense:  0.5 mL    Refill:  0    Harriet Butte, DO [place], PGY-1 10/30/2015 6:51 PM

## 2015-10-30 NOTE — Assessment & Plan Note (Addendum)
Has 20 pack year history. Currently smokes 2 cigs per day. COPD stable on Albuterol inhaler and Advair. Seems to be motivated to quit. Stress is trigger for cig use. Has not tried therapies before. - Encouraged to cut down to 1 cig per day then every other day over the course of the next few months - Patient to meet with Dr. Valentina Lucks for smoking cessation

## 2015-11-01 ENCOUNTER — Encounter: Payer: Self-pay | Admitting: Family Medicine

## 2015-11-05 ENCOUNTER — Encounter: Payer: Self-pay | Admitting: Family Medicine

## 2015-11-05 ENCOUNTER — Telehealth: Payer: Self-pay | Admitting: Family Medicine

## 2015-11-05 NOTE — Telephone Encounter (Signed)
Called patient without answer. Left voicemail stating her A1c was 5.5 and did not indicate she had diabetes. I explained what this number ment. -- Harriet Butte, [place], PGY-1

## 2015-11-13 ENCOUNTER — Other Ambulatory Visit: Payer: Medicare Other

## 2015-11-13 ENCOUNTER — Other Ambulatory Visit (HOSPITAL_COMMUNITY)
Admission: RE | Admit: 2015-11-13 | Discharge: 2015-11-13 | Disposition: A | Discharge status: Home / Self Care | Payer: Medicare Other | Source: Ambulatory Visit | Attending: Infectious Disease | Admitting: Infectious Disease

## 2015-11-13 DIAGNOSIS — E785 Hyperlipidemia, unspecified: Secondary | ICD-10-CM | POA: Diagnosis not present

## 2015-11-13 DIAGNOSIS — Z113 Encounter for screening for infections with a predominantly sexual mode of transmission: Secondary | ICD-10-CM | POA: Diagnosis not present

## 2015-11-13 DIAGNOSIS — B20 Human immunodeficiency virus [HIV] disease: Secondary | ICD-10-CM | POA: Diagnosis not present

## 2015-11-13 LAB — LIPID PANEL
CHOLESTEROL: 227 mg/dL — AB (ref 125–200)
HDL: 59 mg/dL (ref >=46)
LDL Cholesterol: 150 mg/dL — ABNORMAL HIGH (ref <130)
TRIGLYCERIDES: 88 mg/dL (ref <150)
Total CHOL/HDL Ratio: 3.8 Ratio (ref <=5.0)
VLDL: 18 mg/dL (ref <30)

## 2015-11-13 LAB — CBC WITH DIFFERENTIAL/PLATELET
BASOS ABS: 64 {cells}/uL (ref 0–200)
Basophils Relative: 1 %
EOS ABS: 128 {cells}/uL (ref 15–500)
Eosinophils Relative: 2 %
HCT: 41.2 % (ref 35.0–45.0)
Hemoglobin: 13.9 g/dL (ref 11.7–15.5)
LYMPHS PCT: 51 %
Lymphs Abs: 3264 cells/uL (ref 850–3900)
MCH: 32.4 pg (ref 27.0–33.0)
MCHC: 33.7 g/dL (ref 32.0–36.0)
MCV: 96 fL (ref 80.0–100.0)
MONOS PCT: 8 %
MPV: 9.6 fL (ref 7.5–12.5)
Monocytes Absolute: 512 cells/uL (ref 200–950)
NEUTROS PCT: 38 %
Neutro Abs: 2432 cells/uL (ref 1500–7800)
PLATELETS: 273 10*3/uL (ref 140–400)
RBC: 4.29 MIL/uL (ref 3.80–5.10)
RDW: 14.8 % (ref 11.0–15.0)
WBC: 6.4 10*3/uL (ref 3.8–10.8)

## 2015-11-13 LAB — COMPLETE METABOLIC PANEL WITH GFR
ALT: 12 U/L (ref 6–29)
AST: 18 U/L (ref 10–35)
Albumin: 4.2 g/dL (ref 3.6–5.1)
Alkaline Phosphatase: 97 U/L (ref 33–130)
BILIRUBIN TOTAL: 0.5 mg/dL (ref 0.2–1.2)
BUN: 17 mg/dL (ref 7–25)
CHLORIDE: 107 mmol/L (ref 98–110)
CO2: 21 mmol/L (ref 20–31)
Calcium: 9.5 mg/dL (ref 8.6–10.4)
Creat: 1.43 mg/dL — ABNORMAL HIGH (ref 0.50–1.05)
GFR, EST AFRICAN AMERICAN: 48 mL/min — AB (ref >=60)
GFR, EST NON AFRICAN AMERICAN: 42 mL/min — AB (ref >=60)
Glucose, Bld: 84 mg/dL (ref 65–99)
POTASSIUM: 4.2 mmol/L (ref 3.5–5.3)
Sodium: 142 mmol/L (ref 135–146)
TOTAL PROTEIN: 7.5 g/dL (ref 6.1–8.1)

## 2015-11-14 ENCOUNTER — Encounter: Payer: Self-pay | Admitting: Family Medicine

## 2015-11-14 LAB — T-HELPER CELL (CD4) - (RCID CLINIC ONLY)
CD4 % Helper T Cell: 18 % — ABNORMAL LOW (ref 33–55)
CD4 T Cell Abs: 620 /uL (ref 400–2700)

## 2015-11-14 LAB — RPR

## 2015-11-14 LAB — URINE CYTOLOGY ANCILLARY ONLY
CHLAMYDIA, DNA PROBE: NEGATIVE
Neisseria Gonorrhea: NEGATIVE

## 2015-11-15 LAB — HIV-1 RNA QUANT-NO REFLEX-BLD

## 2015-11-19 ENCOUNTER — Encounter: Payer: Self-pay | Admitting: Infectious Disease

## 2015-11-20 ENCOUNTER — Telehealth: Payer: Self-pay | Admitting: Family Medicine

## 2015-11-20 NOTE — Telephone Encounter (Signed)
Pt would like a referral for pain management on [place]. Please advise. Thanks! ep

## 2015-11-21 ENCOUNTER — Encounter: Payer: Self-pay | Admitting: Family Medicine

## 2015-11-27 ENCOUNTER — Ambulatory Visit: Payer: Medicare Other | Admitting: Infectious Disease

## 2015-11-29 ENCOUNTER — Ambulatory Visit (INDEPENDENT_AMBULATORY_CARE_PROVIDER_SITE_OTHER): Payer: Medicare Other | Admitting: Family Medicine

## 2015-11-29 ENCOUNTER — Encounter: Payer: Self-pay | Admitting: Family Medicine

## 2015-11-29 VITALS — BP 128/82 | HR 86 | Temp 97.8°F | Wt 140.8 lb

## 2015-11-29 DIAGNOSIS — M545 Low back pain, unspecified: Secondary | ICD-10-CM

## 2015-11-29 MED ORDER — AMITRIPTYLINE HCL 50 MG PO TABS: 50.0000 mg | ORAL_TABLET | Freq: Every day | ORAL | 0 refills | Status: DC

## 2015-11-29 NOTE — Progress Notes (Signed)
Subjective:   Patient ID: Molly Dillon    DOB: [birth year], 55 y.o. female   MRN: [id number]  CC: "Low back pain and leg pain"  HPI: Molly Dillon is a 55 y.o. female who presents to clinic today with lower back pain and bilateral leg pain. Problems discussed today are as follows:  Back Pain / Bilateral Leg Pain: Onset 2 months ago without h/o trauma. Patient says pain is sharp located on paraspinal muscle at lumbar back and radiates to knees bilaterally. Pain is 10/10 in severity and is not worse with positioning. Has tried NSAIDs, up to 8 ibuprofen w/o relief. No previous hx of similar pains. Was tearful during examination but denied depressive feelings. Says "the pain is getting worse and she wants it to go away." Works all day on concrete floors standing and does not like to be on her feet when she gets home. 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 Denies fevers, neck stiffness, loss of motor function, saddle anesthesia, or loss of bowel/bladder incontinence.  ROS: Complete ROS performed, see HPI for pertinent ROS.  [place]: Pertinent past medical, surgical, family, and social history were reviewed and updated as appropriate. Smoking status reviewed.  Medications reviewed. Current Outpatient Prescriptions  Medication Sig Dispense Refill  . abacavir-dolutegravir-lamiVUDine (TRIUMEQ) 600-50-300 MG tablet Take 1 tablet by mouth daily. 30 tablet 5  . albuterol (PROVENTIL HFA;VENTOLIN HFA) 108 (90 Base) MCG/ACT inhaler Inhale 2 puffs into the lungs every 6 (six) hours as needed for wheezing. 1 Inhaler 11  . amLODipine (NORVASC) 10 MG tablet Take 1 tablet (10 mg total) by mouth daily. 30 tablet 5  . Fluticasone-Salmeterol (ADVAIR DISKUS) 250-50 MCG/DOSE AEPB Inhale 1 puff into the lungs 2 (two) times daily. 60 each 5  . mirtazapine (REMERON) 45 MG tablet Take 1 tablet (45 mg total) by mouth at bedtime. 30 tablet 5  . Multiple Vitamin (MULTIVITAMIN) tablet Take 1  tablet by mouth daily.    . pravastatin (PRAVACHOL) 20 MG tablet Reported on 04/02/2015  1  . triamcinolone ointment (KENALOG) 0.5 % Apply 1 application topically 2 (two) times daily. 30 g 3  . amitriptyline (ELAVIL) 50 MG tablet Take 1 tablet (50 mg total) by mouth at bedtime. 30 tablet 0   No current facility-administered medications for this visit.     Objective:   BP 128/82 (BP Location: Left Arm, Patient Position: Sitting, Cuff Size: Normal)   Pulse 86   Temp 97.8 F (36.6 C) (Oral)   Wt 140 lb 12.8 oz (63.9 kg)   LMP 01/12/2011   SpO2 98%   BMI 24.17 kg/m  Vitals and nursing note reviewed.  General: well nourished, well developed, in no acute distress with non-toxic appearance HEENT: normocephalic, atraumatic, moist mucous membranes Neck: supple, non-tender without lymphadenopathy CV: regular rate and rhythm without murmurs, rubs, or gallops, no lower extremity edema Lungs: clear to auscultation bilaterally with normal work of breathing Abdomen: soft, non-tender, non-distended, no masses or organomegaly palpable, normoactive bowel sounds Skin: warm, dry, no rashes or lesions, cap refill < 2 seconds Extremities: warm and well perfused, normal tone, tenderness upon palpation of bilateral upper thighs sparing calves, slow but appropriate gait, no tenderness at hip with FROM of legs Back: spine midline w/o signs of scoliosis, exquisitely tender to palpation at lumbar paraspinal musculature  Assessment & Plan:   Lumbar back pain Acute onset. Non traumatic. No red flag symptoms. Patients response and pain level do not correlate with short  history of onset. Denies depressive symptoms despite crying. Seems resistant to NSAID use. Suspect nerve component and possible psych. Ddx includes DDD, rheumatological disease, paraspinal muscle strain, fibromyalgia. --Will obtain a spinal xray to evaluate for vertebral abnormalities. Patient to go get this week or next. --Will initiate  amitriptyline 50 mg QD at bedtime for 30 days and reassess pain level. --F/u in clinic 1 month.  Orders Placed This Encounter  Procedures  . DG Lumbar Spine Complete    Standing Status:   Future    Standing Expiration Date:   01/28/2017    Order Specific Question:   Reason for Exam (SYMPTOM  OR DIAGNOSIS REQUIRED)    Answer:   lower back pain    Order Specific Question:   Is patient pregnant?    Answer:   No    Order Specific Question:   Preferred imaging location?    Answer:   [facility]   Meds ordered this encounter  Medications  . amitriptyline (ELAVIL) 50 MG tablet    Sig: Take 1 tablet (50 mg total) by mouth at bedtime.    Dispense:  30 tablet    Refill:  0    Molly Butte, DO [place], PGY-1 11/29/2015 5:36 PM

## 2015-11-29 NOTE — Assessment & Plan Note (Signed)
Acute onset. Non traumatic. No red flag symptoms. Patients response and pain level do not correlate with short history of onset. Denies depressive symptoms despite crying. Seems resistant to NSAID use. Suspect nerve component and possible psych. Ddx includes DDD, rheumatological disease, paraspinal muscle strain, fibromyalgia. --Will obtain a spinal xray to evaluate for vertebral abnormalities. Patient to go get this week or next. --Will initiate amitriptyline 50 mg QD at bedtime for 30 days and reassess pain level. --F/u in clinic 1 month.

## 2015-11-29 NOTE — Patient Instructions (Signed)
It was a pleasure to meet you today. Please see below to review our plan for today's visit.  1. I have given you a prescription for amitriptyline 50 mg which you will take once per day before you go to sleep. Below is information on this medication. 2. Please go to [facility] radiology to have your xray of the back. 3. Return in 4 weeks for evaluation of pain control. Remember we are trying to help 50% of the pain. You are not guaranteed to be without pain.  Please call the clinic at [phone] if your symptoms worsen or you have any concerns. It was my pleasure to see you. -- Harriet Butte, [place], PGY-1  Amitriptyline tablets What is this medicine? AMITRIPTYLINE (a mee TRIP ti leen) is used to treat depression. This medicine may be used for other purposes; ask your health care provider or pharmacist if you have questions. COMMON BRAND NAME(S): Elavil, Vanatrip What should I tell my health care provider before I take this medicine? They need to know if you have any of these conditions: -an alcohol problem -asthma, difficulty breathing -bipolar disorder or schizophrenia -difficulty passing urine, prostate trouble -glaucoma -heart disease or previous heart attack -liver disease -over active thyroid -seizures -thoughts or plans of suicide, a previous suicide attempt, or family history of suicide attempt -an unusual or allergic reaction to amitriptyline, other medicines, foods, dyes, or preservatives -pregnant or trying to get pregnant -breast-feeding How should I use this medicine? Take this medicine by mouth with a drink of water. Follow the directions on the prescription label. You can take the tablets with or without food. Take your medicine at regular intervals. Do not take it more often than directed. Do not stop taking this medicine suddenly except upon the advice of your doctor. Stopping this medicine too quickly may cause serious side effects or your  condition may worsen. A special MedGuide will be given to you by the pharmacist with each prescription and refill. Be sure to read this information carefully each time. Talk to your pediatrician regarding the use of this medicine in children. Special care may be needed. Overdosage: If you think you have taken too much of this medicine contact a poison control center or emergency room at once. NOTE: This medicine is only for you. Do not share this medicine with others. What if I miss a dose? If you miss a dose, take it as soon as you can. If it is almost time for your next dose, take only that dose. Do not take double or extra doses. What may interact with this medicine? Do not take this medicine with any of the following medications: -arsenic trioxide -certain medicines used to regulate abnormal heartbeat or to treat other heart conditions -cisapride -droperidol -halofantrine -linezolid -MAOIs like Carbex, Eldepryl, Marplan, Nardil, and Parnate -methylene blue -other medicines for mental depression -phenothiazines like perphenazine, thioridazine and chlorpromazine -pimozide -probucol -procarbazine -sparfloxacin -St. John's Wort -ziprasidone This medicine may also interact with the following medications: -atropine and related drugs like hyoscyamine, scopolamine, tolterodine and others -barbiturate medicines for inducing sleep or treating seizures, like phenobarbital -cimetidine -disulfiram -ethchlorvynol -thyroid hormones such as levothyroxine This list may not describe all possible interactions. Give your health care provider a list of all the medicines, herbs, non-prescription drugs, or dietary supplements you use. Also tell them if you smoke, drink alcohol, or use illegal drugs. Some items may interact with your medicine. What should I watch for while using this  medicine? Tell your doctor if your symptoms do not get better or if they get worse. Visit your doctor or health care  professional for regular checks on your progress. Because it may take several weeks to see the full effects of this medicine, it is important to continue your treatment as prescribed by your doctor. Patients and their families should watch out for new or worsening thoughts of suicide or depression. Also watch out for sudden changes in feelings such as feeling anxious, agitated, panicky, irritable, hostile, aggressive, impulsive, severely restless, overly excited and hyperactive, or not being able to sleep. If this happens, especially at the beginning of treatment or after a change in dose, call your health care professional. Dennis Bast may get drowsy or dizzy. Do not drive, use machinery, or do anything that needs mental alertness until you know how this medicine affects you. Do not stand or sit up quickly, especially if you are an older patient. This reduces the risk of dizzy or fainting spells. Alcohol may interfere with the effect of this medicine. Avoid alcoholic drinks. Do not treat yourself for coughs, colds, or allergies without asking your doctor or health care professional for advice. Some ingredients can increase possible side effects. Your mouth may get dry. Chewing sugarless gum or sucking hard candy, and drinking plenty of water will help. Contact your doctor if the problem does not go away or is severe. This medicine may cause dry eyes and blurred vision. If you wear contact lenses you may feel some discomfort. Lubricating drops may help. See your eye doctor if the problem does not go away or is severe. This medicine can cause constipation. Try to have a bowel movement at least every 2 to 3 days. If you do not have a bowel movement for 3 days, call your doctor or health care professional. This medicine can make you more sensitive to the sun. Keep out of the sun. If you cannot avoid being in the sun, wear protective clothing and use sunscreen. Do not use sun lamps or tanning beds/booths. What side  effects may I notice from receiving this medicine? Side effects that you should report to your doctor or health care professional as soon as possible: -allergic reactions like skin rash, itching or hives, swelling of the face, lips, or tongue -anxious -breathing problems -changes in vision -confusion -elevated mood, decreased need for sleep, racing thoughts, impulsive behavior -eye pain -fast, irregular heartbeat -feeling faint or lightheaded, falls -feeling agitated, angry, or irritable -fever with increased sweating -hallucination, loss of contact with reality -seizures -stiff muscles -suicidal thoughts or other mood changes -tingling, pain, or numbness in the feet or hands -trouble passing urine or change in the amount of urine -trouble sleeping -unusually weak or tired -vomiting -yellowing of the eyes or skin Side effects that usually do not require medical attention (report to your doctor or health care professional if they continue or are bothersome): -change in sex drive or performance -change in appetite or weight -constipation -dizziness -dry mouth -nausea -tired -tremors -upset stomach This list may not describe all possible side effects. Call your doctor for medical advice about side effects. You may report side effects to FDA at 1-800-FDA-1088. Where should I keep my medicine? Keep out of the reach of children. Store at room temperature between 20 and 25 degrees C (68 and 77 degrees F). Throw away any unused medicine after the expiration date. NOTE: This sheet is a summary. It may not cover all possible information. If you have  questions about this medicine, talk to your doctor, pharmacist, or health care provider.  2017 Elsevier/Gold Standard (2015-05-31 12:14:15)

## 2015-12-25 ENCOUNTER — Ambulatory Visit (INDEPENDENT_AMBULATORY_CARE_PROVIDER_SITE_OTHER): Payer: Medicare Other | Admitting: Infectious Disease

## 2015-12-25 ENCOUNTER — Encounter: Payer: Self-pay | Admitting: Infectious Disease

## 2015-12-25 VITALS — BP 137/85 | HR 76 | Temp 97.9°F | Wt 139.8 lb

## 2015-12-25 DIAGNOSIS — M545 Low back pain, unspecified: Secondary | ICD-10-CM

## 2015-12-25 DIAGNOSIS — I10 Essential (primary) hypertension: Secondary | ICD-10-CM | POA: Diagnosis not present

## 2015-12-25 DIAGNOSIS — R05 Cough: Secondary | ICD-10-CM | POA: Diagnosis not present

## 2015-12-25 DIAGNOSIS — Z23 Encounter for immunization: Secondary | ICD-10-CM

## 2015-12-25 DIAGNOSIS — R634 Abnormal weight loss: Secondary | ICD-10-CM | POA: Diagnosis not present

## 2015-12-25 DIAGNOSIS — F17200 Nicotine dependence, unspecified, uncomplicated: Secondary | ICD-10-CM | POA: Diagnosis not present

## 2015-12-25 DIAGNOSIS — B20 Human immunodeficiency virus [HIV] disease: Secondary | ICD-10-CM

## 2015-12-25 DIAGNOSIS — R059 Cough, unspecified: Secondary | ICD-10-CM

## 2015-12-25 DIAGNOSIS — I129 Hypertensive chronic kidney disease with stage 1 through stage 4 chronic kidney disease, or unspecified chronic kidney disease: Secondary | ICD-10-CM | POA: Diagnosis not present

## 2015-12-25 DIAGNOSIS — E784 Other hyperlipidemia: Secondary | ICD-10-CM | POA: Diagnosis not present

## 2015-12-25 DIAGNOSIS — E7849 Other hyperlipidemia: Secondary | ICD-10-CM

## 2015-12-25 MED ORDER — AMLODIPINE BESYLATE 10 MG PO TABS: 10.0000 mg | ORAL_TABLET | Freq: Every day | ORAL | 5 refills | Status: DC

## 2015-12-25 MED ORDER — FLUTICASONE-SALMETEROL 250-50 MCG/DOSE IN AEPB: 1.0000 | INHALATION_SPRAY | Freq: Two times a day (BID) | RESPIRATORY_TRACT | 5 refills | Status: DC

## 2015-12-25 MED ORDER — PRAVASTATIN SODIUM 20 MG PO TABS: 20.0000 mg | ORAL_TABLET | Freq: Every day | ORAL | 11 refills | Status: DC

## 2015-12-25 MED ORDER — ABACAVIR-DOLUTEGRAVIR-LAMIVUD 600-50-300 MG PO TABS: 1.0000 | ORAL_TABLET | Freq: Every day | ORAL | 5 refills | Status: DC

## 2015-12-25 NOTE — Addendum Note (Signed)
Addended by: Rodman Key A on: 12/25/2015 05:13 PM   Modules accepted: Orders

## 2015-12-25 NOTE — Progress Notes (Signed)
Subjective:   Chief complaint follow up for HIV also concerned with unintentional weight loss and depressive symptoms related to difficulty maintaining a job with her chronic back pain  Patient ID: Molly Dillon, female    DOB: [birth year], 55 y.o.   MRN: [id number]  Depression         This is a chronic problem.  The current episode started more than 1 month ago.   The onset quality is gradual.   The problem occurs every several days.  The problem has been waxing and waning since onset.  Associated symptoms include no decreased concentration, no fatigue, no appetite change and no myalgias.    55 year old with HIV, Hypertension ahypertensive nephropathy, asthma and COPD   We had worked her up for weight loss with  colonoscopy that she reports was normal she had a mammogram which was normal as well.  I had not yet gotten CT scans but since she still complains that her weight will not go up and she is concerned that she is losing weight without intending to get a CT of her chest abdomen pelvis without contrast given that her creatinine has gone up a bit to 1.4 range  She does continue to smoke and occasionally has cough which is nonproductive  Her HIV remains under excellent control .  Lab Results  Component Value Date   HIV1RNAQUANT <20 11/13/2015   HIV1RNAQUANT <20 07/23/2015   HIV1RNAQUANT <20 02/13/2015   Lab Results  Component Value Date   CD4TABS 620 11/13/2015   CD4TABS 570 07/23/2015   CD4TABS 460 02/13/2015        Past Medical History:  Diagnosis Date  . Acute renal insufficiency 06/06/2014  . Asthma   . Constipation 07/19/2014  . COPD (chronic obstructive pulmonary disease) with acute bronchitis ([place]) 06/06/2014  . Depression   . Depression, major, recurrent, moderate ([place]) 04/02/2015  . HIV infection ([place])   . Hyperlipidemia   . Hypertension   . Hypertensive nephropathy 07/19/2014  . Unintentional weight loss 02/13/2015    No past surgical history on  file.  No family history on file.    Social History   Social History  . Marital status: Married    Spouse name: N/A  . Number of children: N/A  . Years of education: N/A   Social History Main Topics  . Smoking status: Light Tobacco Smoker    Packs/day: 0.25    Years: 20.00    Types: Cigarettes    Last attempt to quit: 07/02/2014  . Smokeless tobacco: Never Used     Comment: down to 5 cigs a day  . Alcohol use None  . Drug use: Unknown  . Sexual activity: Yes    Partners: Male    Birth control/ protection: Condom     Comment: given condoms   Other Topics Concern  . None   Social History Narrative  . None    Allergies  Allergen Reactions  . Dapsone     REACTION: SOB  . Penicillins     REACTION: hives     Current Outpatient Prescriptions:  .  abacavir-dolutegravir-lamiVUDine (TRIUMEQ) 600-50-300 MG tablet, Take 1 tablet by mouth daily., Disp: 30 tablet, Rfl: 5 .  albuterol (PROVENTIL HFA;VENTOLIN HFA) 108 (90 Base) MCG/ACT inhaler, Inhale 2 puffs into the lungs every 6 (six) hours as needed for wheezing., Disp: 1 Inhaler, Rfl: 11 .  amitriptyline (ELAVIL) 50 MG tablet, Take 1 tablet (50 mg total) by mouth  at bedtime., Disp: 30 tablet, Rfl: 0 .  amLODipine (NORVASC) 10 MG tablet, Take 1 tablet (10 mg total) by mouth daily., Disp: 30 tablet, Rfl: 5 .  Fluticasone-Salmeterol (ADVAIR DISKUS) 250-50 MCG/DOSE AEPB, Inhale 1 puff into the lungs 2 (two) times daily., Disp: 60 each, Rfl: 5 .  mirtazapine (REMERON) 45 MG tablet, Take 1 tablet (45 mg total) by mouth at bedtime., Disp: 30 tablet, Rfl: 5 .  Multiple Vitamin (MULTIVITAMIN) tablet, Take 1 tablet by mouth daily., Disp: , Rfl:  .  pravastatin (PRAVACHOL) 20 MG tablet, Reported on 04/02/2015, Disp: , Rfl: 1 .  triamcinolone ointment (KENALOG) 0.5 %, Apply 1 application topically 2 (two) times daily., Disp: 30 g, Rfl: 3   Review of Systems  Constitutional: Positive for unexpected weight change. Negative for activity  change, appetite change, chills, diaphoresis, fatigue and fever.  HENT: Negative for congestion, rhinorrhea, sinus pressure, sneezing, sore throat and trouble swallowing.   Eyes: Negative for photophobia and visual disturbance.  Respiratory: Negative for cough, chest tightness, shortness of breath, wheezing and stridor.   Cardiovascular: Negative for chest pain, palpitations and leg swelling.  Gastrointestinal: Negative for abdominal distention, abdominal pain, anal bleeding, blood in stool, diarrhea, nausea and vomiting.  Genitourinary: Negative for difficulty urinating, dysuria, flank pain and hematuria.  Musculoskeletal: Positive for back pain. Negative for arthralgias, gait problem, joint swelling and myalgias.  Skin: Negative for color change, pallor and wound.  Neurological: Negative for dizziness, tremors, weakness and light-headedness.  Hematological: Negative for adenopathy. Does not bruise/bleed easily.  Psychiatric/Behavioral: Positive for depression. Negative for agitation, behavioral problems, confusion, decreased concentration, dysphoric mood and sleep disturbance.       Objective:   Physical Exam  Constitutional: She is oriented to person, place, and time. She appears well-developed and well-nourished. No distress.  HENT:  Head: Normocephalic and atraumatic.  Mouth/Throat: No oropharyngeal exudate.  Eyes: Conjunctivae and EOM are normal. No scleral icterus.  Neck: Normal range of motion. Neck supple.  Cardiovascular: Normal rate, regular rhythm and normal heart sounds.  Exam reveals no gallop and no friction rub.   No murmur heard. Pulmonary/Chest: Effort normal and breath sounds normal. No respiratory distress. She has no wheezes. She has no rales.  Abdominal: Soft. She exhibits no distension. There is no tenderness.  Musculoskeletal: She exhibits no edema or tenderness.       Right hip: She exhibits normal range of motion.       Left hip: She exhibits normal range of  motion.     Neurological: She is alert and oriented to person, place, and time. She exhibits normal muscle tone. Coordination normal.  Skin: Skin is warm and dry. She is not diaphoretic. No erythema. No pallor.  Psychiatric: She has a normal mood and affect. Her behavior is normal. Judgment and thought content normal.         Assessment & Plan:     HIV:  Continue TRIUMEQ recheck labs  today and in 6 months.  COPD: continue inhaled corticosteroids and prn broncholdilators.   Hypertensive nephropathy and now CKD: continue amlodipine,  There were no vitals filed for this visit. Lab Results  Component Value Date   CREATININE 1.43 (H) 11/13/2015   CREATININE 1.27 (H) 07/23/2015   CREATININE 1.47 (H) 02/13/2015   Unintentional  Likely much of this is related to her depression and or something else that is not due to an ominous systemic medical problem, given that her weight loss is been involuntary given that she's  had intermittent cough and that she is smoker we'll get a CT of her chest and abdomen pelvis without contrast.  Smoking: again counseled re smoking cessation  Depression: she did not wish to see counselor today, he claims  to be on an antidepressant.  Hyperlipidemia she is not been taking her Pravachol sites in the back to her pharmacy to be refilled again.  I spent greater than 25 minutes with the patient including greater than 50% of time in face to face counsel of the patient re her HIV,  Unintentional weight loss hypertensive nephropathy, smoking depression, smoking , Lipidemia and in coordination of her care.

## 2016-01-02 ENCOUNTER — Ambulatory Visit (INDEPENDENT_AMBULATORY_CARE_PROVIDER_SITE_OTHER): Payer: Medicare Other | Admitting: Family Medicine

## 2016-01-02 ENCOUNTER — Encounter: Payer: Self-pay | Admitting: Family Medicine

## 2016-01-02 DIAGNOSIS — M545 Low back pain, unspecified: Secondary | ICD-10-CM

## 2016-01-02 MED ORDER — DULOXETINE HCL 30 MG PO CPEP: 30.0000 mg | ORAL_CAPSULE | Freq: Every day | ORAL | 0 refills | Status: DC

## 2016-01-02 NOTE — Assessment & Plan Note (Addendum)
Acute. Worsened. Now affecting UE. Does not shows si/sy of cauda equina. Resistant to NSAIDs and amitriptyline. Patient appears be less tearful and in less pain than last visit though pt denies improvement. No depressive or anxious feelings. Tenderness at UE and LE similar in character. Believe this could be fibromyalgia. Other possibilities include arthritis, myositis, or an autoimmune disorder. --Discontinue amitriptyline 50 mg QD --Will initiate Cymbalta 30 mg QD for x1 week, then increase to 60 mg daily, given #60 caps, 0 refills --Told to add tylenol or NSAIDs PRN for additional relief while on Cymbalta --Consider adding Lyrica 150 mg QD (divided 75 mg BID) during next visit for combination therapy --Pt to get spinal lumbar xray, will notify pt of results --RTC in 1 month
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
## 2016-01-02 NOTE — Patient Instructions (Signed)
It was a pleasure to meet you today. Please see below to review our plan for today's visit.  1. Please stop the amitriptyline. I will start you on a new medication called Cymbalta. You will take one 30 mg capsule daily for 1 week, then increase to twice daily (total 60 mg daily). 2. Please go receive you lumbar spine xray. I will notify you of your results.  3. Return to clinic in 1 month.  Please call the clinic at [phone] if your symptoms worsen or you have any concerns. It was my pleasure to see you. -- Harriet Butte, [place], PGY-1

## 2016-01-24 ENCOUNTER — Ambulatory Visit
Admission: RE | Admit: 2016-01-24 | Discharge: 2016-01-24 | Disposition: A | Discharge status: Home / Self Care | Payer: Medicare Other | Source: Ambulatory Visit | Attending: Infectious Disease | Admitting: Infectious Disease

## 2016-01-24 DIAGNOSIS — R109 Unspecified abdominal pain: Secondary | ICD-10-CM | POA: Diagnosis not present

## 2016-01-24 DIAGNOSIS — R634 Abnormal weight loss: Secondary | ICD-10-CM

## 2016-01-24 DIAGNOSIS — R059 Cough, unspecified: Secondary | ICD-10-CM

## 2016-01-24 DIAGNOSIS — R05 Cough: Secondary | ICD-10-CM

## 2016-01-24 DIAGNOSIS — F17200 Nicotine dependence, unspecified, uncomplicated: Secondary | ICD-10-CM

## 2016-01-24 DIAGNOSIS — B20 Human immunodeficiency virus [HIV] disease: Secondary | ICD-10-CM

## 2016-01-24 DIAGNOSIS — R079 Chest pain, unspecified: Secondary | ICD-10-CM | POA: Diagnosis not present

## 2016-01-27 ENCOUNTER — Telehealth: Payer: Self-pay | Admitting: *Deleted

## 2016-01-27 DIAGNOSIS — M87051 Idiopathic aseptic necrosis of right femur: Secondary | ICD-10-CM

## 2016-01-27 DIAGNOSIS — M87052 Idiopathic aseptic necrosis of left femur: Principal | ICD-10-CM

## 2016-01-27 NOTE — Telephone Encounter (Signed)
Verbal order to refer patient to orthopedics per Dr Tommy Medal after avascular necrosis is indicated on recent CT.  Paitent with [employer], approval for 6 visits/6 months given by PCP's office (NPI [id number]).  Referral sent to [facility] via EPIC.  RN to follow. Landis Gandy, RN

## 2016-01-27 NOTE — Telephone Encounter (Signed)
Please see telephone note from Dr. Derek Mound office.  Derl Barrow, RN

## 2016-01-27 NOTE — Telephone Encounter (Signed)
Contacted patient via cell phone concerning recent CT results showing avascular necrosis of the femoral heads. These findings were explained to the patient and the patient acknowledged understanding. Patient was told she would be contacted by orthopedics concerning appointment for possible surgical intervention. Patient has an appointment with me 02/03/2016. -- Harriet Butte, [place], PGY-1

## 2016-01-27 NOTE — Telephone Encounter (Signed)
Adjusted patient's referral, sent to [facility] office. They will contact patient to schedule.

## 2016-02-03 ENCOUNTER — Ambulatory Visit (INDEPENDENT_AMBULATORY_CARE_PROVIDER_SITE_OTHER): Payer: Medicare Other | Admitting: Family Medicine

## 2016-02-03 VITALS — BP 119/75 | HR 89 | Temp 98.0°F | Ht 64.0 in | Wt 140.0 lb

## 2016-02-03 DIAGNOSIS — M545 Low back pain, unspecified: Secondary | ICD-10-CM

## 2016-02-03 DIAGNOSIS — M87059 Idiopathic aseptic necrosis of unspecified femur: Secondary | ICD-10-CM | POA: Diagnosis not present

## 2016-02-03 MED ORDER — DULOXETINE HCL 30 MG PO CPEP: 60.0000 mg | ORAL_CAPSULE | Freq: Every day | ORAL | 0 refills | Status: DC

## 2016-02-03 MED ORDER — PREGABALIN 75 MG PO CAPS
75.0000 mg | ORAL_CAPSULE | Freq: Two times a day (BID) | ORAL | 0 refills | Status: DC
Start: 2016-02-03 — End: 2016-03-20

## 2016-02-03 NOTE — Progress Notes (Addendum)
   Subjective:   Patient ID: Molly Dillon    DOB: 10-Mar-1960, 56 y.o. female   MRN: [id number]  CC: "chronic pain"  HPI: Molly Dillon is a 56 y.o. female who presents to clinic today for follow up concerning chronic pain. Problems discussed today are as follows:  Chronic pain: Says pain is "everywhere and worse in back and hands." Says the pain in her back is bilateral and rungs down her legs. Pain is described as "stiffness" and has improved from 10/10 to 8/10 since last visit. Patient says avoid standing relieves back pain. Denies sensory or motor loss, saddle anesthesia, bowel or bladder incontinence.  Bilateral avascular necrosis femoral heads: Recently diagnosed by CT of pelvis. Patient does not understand what the findings mean. Says she has an appointment on 02/19/2016 with orthopedics. Has been avoiding standing for long periods of time. Denies history of long term prednisone use.  ROS: complete ROS performed, see HPI for pertinent ROS.  [place]: HIV, HTN with nephropathy, HLD, COPD, asthma, depression. Smoking status reviewed. Medications reviewed.  Objective:   BP 119/75 (BP Location: Left Arm, Patient Position: Sitting, Cuff Size: Normal)   Pulse 89   Temp 98 F (36.7 C) (Oral)   Ht 5\' 4"  (1.626 m)   Wt 140 lb (63.5 kg)   LMP 01/12/2011   SpO2 98%   BMI 24.03 kg/m  Vitals and nursing note reviewed.  General: thin-appearing, well developed, in no acute distress with non-toxic appearance HEENT: normocephalic, atraumatic, moist mucous membranes Neck: supple, non-tender without lymphadenopathy CV: regular rate and rhythm without murmurs, rubs, or gallops, no lower extremity edema Lungs: clear to auscultation bilaterally with normal work of breathing Abdomen: soft, non-tender, non-distended, no masses or organomegaly palpable, normoactive bowel sounds Skin: warm, dry, no rashes or lesions, cap refill < 2 seconds Extremities: warm and well perfused, normal tone, exquisitely  tender upon palpation of all 4 extremities, 5/5 motor strength to UE and LE b/l MSK: back on all levels is exquisitely tender upon palpation, no scoliosis, slow gait  Assessment & Plan:   Lumbar back pain Chronic. Slightly improved since last visit since starting Cymbalta but not convinced this is due to medication. Does continue to show diffuse tenderness on all parts of the body with same pain according to patient. This seems to be fibromyalgia-like. Denies depressive feelings. --Continue Cymbalta 60 mg QD. --Adding Lyrica 75 mg BID. Given information on medication. Instructed not to take amitriptyline. --RTC in 1 month.  Avascular necrosis of femoral head (HCC) Chronic. New finding on CT. No h/o long term prednisone use of trauma. HIV could be risk factor. Patient to see orthopedics.  --Orthopedic appointment on 02/19/2016. Will evaluate for medical vs surgical intervention. --Patient instructed to avoid stressing hip and standing for prolonged periods of time.  No orders of the defined types were placed in this encounter.  Meds ordered this encounter  Medications  . DULoxetine (CYMBALTA) 30 MG capsule    Sig: Take 2 capsules (60 mg total) by mouth daily.    Dispense:  60 capsule    Refill:  0  . pregabalin (LYRICA) 75 MG capsule    Sig: Take 1 capsule (75 mg total) by mouth 2 (two) times daily.    Dispense:  60 capsule    Refill:  0    Harriet Butte, [place], PGY-1 02/04/2016 10:42 AM

## 2016-02-03 NOTE — Patient Instructions (Signed)
It was a pleasure to meet you today. Please see below to review our plan for today's visit.  1. Please see the orthopedic doctor as scheduled on 02/19/2016. They will discuss for treatment for avascular necrosis. 2. Continue Cymbalta 60 mg daily. I will add on another medication called Lyrica which she will take 75 mg twice daily for a total of 150 mg. Please discontinued her amitriptyline all using these medications. See below for information concerning this new medication called pregabalin (lyrica). 3. Return to the clinic in one month.  Please call the clinic at [phone] if your symptoms worsen or you have any concerns. It was my pleasure to see you. -- Harriet Butte, [place], PGY-1  Pregabalin capsules What is this medicine? PREGABALIN (pre GAB a lin) is used to treat nerve pain from diabetes, shingles, spinal cord injury, and fibromyalgia. It is also used to control seizures in epilepsy. This medicine may be used for other purposes; ask your health care provider or pharmacist if you have questions. COMMON BRAND NAME(S): Lyrica What should I tell my health care provider before I take this medicine? They need to know if you have any of these conditions: -bleeding problems -heart disease, including heart failure -history of alcohol or drug abuse -kidney disease -suicidal thoughts, plans, or attempt; a previous suicide attempt by you or a family member -an unusual or allergic reaction to pregabalin, gabapentin, other medicines, foods, dyes, or preservatives -pregnant or trying to get pregnant or trying to conceive with your partner -breast-feeding How should I use this medicine? Take this medicine by mouth with a glass of water. Follow the directions on the prescription label. You can take this medicine with or without food. Take your doses at regular intervals. Do not take your medicine more often than directed. Do not stop taking except on your doctor's  advice. A special MedGuide will be given to you by the pharmacist with each prescription and refill. Be sure to read this information carefully each time. Talk to your pediatrician regarding the use of this medicine in children. Special care may be needed. Overdosage: If you think you have taken too much of this medicine contact a poison control center or emergency room at once. NOTE: This medicine is only for you. Do not share this medicine with others. What if I miss a dose? If you miss a dose, take it as soon as you can. If it is almost time for your next dose, take only that dose. Do not take double or extra doses. What may interact with this medicine? -alcohol -certain medicines for blood pressure like captopril, enalapril, or lisinopril -certain medicines for diabetes, like pioglitazone or rosiglitazone -certain medicines for anxiety or sleep -narcotic medicines for pain This list may not describe all possible interactions. Give your health care provider a list of all the medicines, herbs, non-prescription drugs, or dietary supplements you use. Also tell them if you smoke, drink alcohol, or use illegal drugs. Some items may interact with your medicine. What should I watch for while using this medicine? Tell your doctor or healthcare professional if your symptoms do not start to get better or if they get worse. Visit your doctor or health care professional for regular checks on your progress. Do not stop taking except on your doctor's advice. You may develop a severe reaction. Your doctor will tell you how much medicine to take. Wear a medical identification bracelet or chain if you are taking this medicine for  seizures, and carry a card that describes your disease and details of your medicine and dosage times. You may get drowsy or dizzy. Do not drive, use machinery, or do anything that needs mental alertness until you know how this medicine affects you. Do not stand or sit up quickly,  especially if you are an older patient. This reduces the risk of dizzy or fainting spells. Alcohol may interfere with the effect of this medicine. Avoid alcoholic drinks. If you have a heart condition, like congestive heart failure, and notice that you are retaining water and have swelling in your hands or feet, contact your health care provider immediately. The use of this medicine may increase the chance of suicidal thoughts or actions. Pay special attention to how you are responding while on this medicine. Any worsening of mood, or thoughts of suicide or dying should be reported to your health care professional right away. This medicine has caused reduced sperm counts in some men. This may interfere with the ability to father a child. You should talk to your doctor or health care professional if you are concerned about your fertility. Women who become pregnant while using this medicine for seizures may enroll in the [place] Pregnancy Registry by calling [phone]. This registry collects information about the safety of antiepileptic drug use during pregnancy. What side effects may I notice from receiving this medicine? Side effects that you should report to your doctor or health care professional as soon as possible: -allergic reactions like skin rash, itching or hives, swelling of the face, lips, or tongue -breathing problems -changes in vision -chest pain -confusion -jerking or unusual movements of any part of your body -loss of memory -muscle pain, tenderness, or weakness -suicidal thoughts or other mood changes -swelling of the ankles, feet, hands -unusual bruising or bleeding Side effects that usually do not require medical attention (report to your doctor or health care professional if they continue or are bothersome): -dizziness -drowsiness -dry mouth -headache -nausea -tremors -trouble sleeping -weight gain This list may not describe all possible  side effects. Call your doctor for medical advice about side effects. You may report side effects to FDA at 1-800-FDA-1088. Where should I keep my medicine? Keep out of the reach of children. This medicine can be abused. Keep your medicine in a safe place to protect it from theft. Do not share this medicine with anyone. Selling or giving away this medicine is dangerous and against the law. This medicine may cause accidental overdose and death if it taken by other adults, children, or pets. Mix any unused medicine with a substance like cat litter or coffee grounds. Then throw the medicine away in a sealed container like a sealed bag or a coffee can with a lid. Do not use the medicine after the expiration date. Store at room temperature between 15 and 30 degrees C (59 and 86 degrees F). NOTE: This sheet is a summary. It may not cover all possible information. If you have questions about this medicine, talk to your doctor, pharmacist, or health care provider.  2017 Elsevier/Gold Standard (2015-01-31 10:26:12)

## 2016-02-04 ENCOUNTER — Encounter: Payer: Self-pay | Admitting: Family Medicine

## 2016-02-04 DIAGNOSIS — M87059 Idiopathic aseptic necrosis of unspecified femur: Secondary | ICD-10-CM | POA: Insufficient documentation

## 2016-02-04 NOTE — Assessment & Plan Note (Signed)
Chronic. Slightly improved since last visit since starting Cymbalta but not convinced this is due to medication. Does continue to show diffuse tenderness on all parts of the body with same pain according to patient. This seems to be fibromyalgia-like. Denies depressive feelings. --Continue Cymbalta 60 mg QD. --Adding Lyrica 75 mg BID. Given information on medication. Instructed not to take amitriptyline. --RTC in 1 month.

## 2016-02-04 NOTE — Assessment & Plan Note (Signed)
Chronic. New finding on CT. No h/o long term prednisone use of trauma. HIV could be risk factor. Patient to see orthopedics.  --Orthopedic appointment on 02/19/2016. Will evaluate for medical vs surgical intervention. --Patient instructed to avoid stressing hip and standing for prolonged periods of time.

## 2016-02-19 ENCOUNTER — Ambulatory Visit (INDEPENDENT_AMBULATORY_CARE_PROVIDER_SITE_OTHER): Payer: Medicare Other | Admitting: Orthopaedic Surgery

## 2016-02-19 ENCOUNTER — Ambulatory Visit (INDEPENDENT_AMBULATORY_CARE_PROVIDER_SITE_OTHER): Payer: Medicare Other

## 2016-02-19 DIAGNOSIS — M7062 Trochanteric bursitis, left hip: Secondary | ICD-10-CM | POA: Diagnosis not present

## 2016-02-19 DIAGNOSIS — M25551 Pain in right hip: Secondary | ICD-10-CM

## 2016-02-19 DIAGNOSIS — M25552 Pain in left hip: Secondary | ICD-10-CM

## 2016-02-19 DIAGNOSIS — M7061 Trochanteric bursitis, right hip: Secondary | ICD-10-CM | POA: Insufficient documentation

## 2016-02-19 MED ORDER — METHYLPREDNISOLONE 4 MG PO TABS: ORAL_TABLET | ORAL | 0 refills | Status: DC

## 2016-02-19 MED ORDER — DICLOFENAC SODIUM 75 MG PO TBEC: 75.0000 mg | DELAYED_RELEASE_TABLET | Freq: Two times a day (BID) | ORAL | 3 refills | Status: DC | PRN

## 2016-02-19 NOTE — Progress Notes (Signed)
Office Visit Note   Patient: Molly Dillon           Date of Birth: 02-08-1960           MRN: [id number] Visit Date: 02/19/2016              Requested by: [place] Bing, DO [address] [place], [place] [address] PCP: [place] Bing, DO   Assessment & Plan: Visit Diagnoses:  1. Pain in left hip   2. Pain in right hip   3. Trochanteric bursitis, right hip   4. Trochanteric bursitis, left hip     Plan: Her symptoms and clinical exam are more consistent with trochanteric bursitis of her hips. I showed her stretching exercises to try and we'll put her on a six-day steroid taper and some diclofenac to see if this will help. Her insurance joint over one-time therapy visits her showing her the stretching exercises certainly more reasonable. I did offer a steroid injection in each hip and she also declined this is see if the stretching and other medications will help. I will see her back in a month to see if she is doing better if not we'll consider a steroid injection in each trochanteric area and she understands this as well. She understands that there is a high incidence of avascular necrosis in patients who are HIV positive but her x-rays and clinical exam point more to bursitis the joint issue. We'll see her in a month again see how she doing overall.  Follow-Up Instructions: Return in about 4 weeks (around 03/18/2016).   Orders:  Orders Placed This Encounter  Procedures  . XR HIPS BILAT W OR W/O PELVIS 3-4 VIEWS   Meds ordered this encounter  Medications  . methylPREDNISolone (MEDROL) 4 MG tablet    Sig: Medrol dose pack. Take as instructed    Dispense:  21 tablet    Refill:  0  . diclofenac (VOLTAREN) 75 MG EC tablet    Sig: Take 1 tablet (75 mg total) by mouth 2 (two) times daily between meals as needed.    Dispense:  60 tablet    Refill:  3      Procedures: No procedures performed   Clinical Data: No additional findings.   Subjective: Chief Complaint  Patient  presents with  . Right Hip - Pain  . Left Hip - Pain    HPI The patient is very pleasant 67 year comes in with a chief complaint of bilateral hip has been hurting for a while. She doesn't groin pain hurts on the sides of both her hips. It does not radiate down either knee and hurts mainly when she's been laying gets up in the morning. Review of Systems Denies a change in bowel or bladder function. She denies any chest pain, shortness of breath, fever, chills, nausea, vomiting  Objective: Vital Signs: LMP 01/12/2011   Physical Exam He is alert and oriented 3 and does not walk with any type of assistive device nor limp. Ortho Exam Family both hip shows fluid range of motion actively and passively with no pain or blocks to rotation. Her pain is only to palpation of the trochanteric area on both sides and does radiate to the IT band on both sides. Her back for an ankle exam are normal bilaterally. Specialty Comments:  No specialty comments available.  Imaging: Xr Hips Bilat W Or W/o Pelvis 3-4 Views  Result Date: 02/19/2016 An AP pelvis and lateral of her hips show  no cortical irregularities around the trochanteric area of either hip. Both hips are well located. These femoral heads are nice and concentric. There is no evidence of arthritic changes and there is no evidence of avascular necrosis    PMFS History: Patient Active Problem List   Diagnosis Date Noted  . Trochanteric bursitis, left hip 02/19/2016  . Trochanteric bursitis, right hip 02/19/2016  . Avascular necrosis of femoral head ([place]) 02/04/2016  . Lumbar back pain 11/29/2015  . Depression, major, recurrent, moderate ([place]) 04/02/2015  . Unintentional weight loss 02/13/2015  . Constipation 07/19/2014  . Hypertensive nephropathy 07/19/2014  . Acute renal insufficiency 06/06/2014  . COPD (chronic obstructive pulmonary disease) with acute bronchitis ([place]) 06/06/2014  . Current every day smoker 06/15/2013  . Loss of weight  09/14/2012  . FIBROIDS, UTERUS 06/19/2009  . HYPERCHOLESTEROLEMIA, BORDERLINE, WITH HIGH HDL 06/07/2009  . SINUSITIS, ACUTE 06/07/2009  . DYSFUNCTIONAL UTERINE BLEEDING 06/07/2009  . ABDOMINAL PAIN, RIGHT UPPER QUADRANT 06/07/2009  . Allergic rhinitis 05/09/2009  . ALKALINE PHOSPHATASE, ELEVATED 05/09/2009  . HEMATURIA UNSPECIFIED 05/17/2008  . Hyperlipidemia 05/03/2008  . HEMOCCULT POSITIVE STOOL 05/03/2008  . MICROSCOPIC HEMATURIA 05/03/2008  . MENOPAUSAL SYNDROME 05/03/2008  . THYROMEGALY 05/03/2007  . PERIPHERAL NEUROPATHY, IDIOPATHIC 04/08/2006  . Human immunodeficiency virus (HIV) disease ([place]) 01/26/2006  . DEPRESSION 01/26/2006  . Essential hypertension 01/26/2006  . Asthma 01/26/2006   Past Medical History:  Diagnosis Date  . Acute renal insufficiency 06/06/2014  . Asthma   . Constipation 07/19/2014  . COPD (chronic obstructive pulmonary disease) with acute bronchitis ([place]) 06/06/2014  . Depression   . Depression, major, recurrent, moderate ([place]) 04/02/2015  . HIV infection ([place])   . Hyperlipidemia   . Hypertension   . Hypertensive nephropathy 07/19/2014  . Unintentional weight loss 02/13/2015    No family history on file.  No past surgical history on file. Social History   Occupational History  . Not on file.   Social History Main Topics  . Smoking status: Light Tobacco Smoker    Packs/day: 0.25    Years: 20.00    Types: Cigarettes    Last attempt to quit: 07/02/2014  . Smokeless tobacco: Never Used     Comment: down to 5 cigs a day  . Alcohol use Not on file  . Drug use: Unknown  . Sexual activity: Yes    Partners: Male    Birth control/ protection: Condom     Comment: given condoms

## 2016-03-02 ENCOUNTER — Ambulatory Visit: Payer: Medicare Other | Admitting: Family Medicine

## 2016-03-18 ENCOUNTER — Ambulatory Visit (INDEPENDENT_AMBULATORY_CARE_PROVIDER_SITE_OTHER): Payer: Medicare Other | Admitting: Orthopaedic Surgery

## 2016-03-18 DIAGNOSIS — M7062 Trochanteric bursitis, left hip: Secondary | ICD-10-CM

## 2016-03-18 DIAGNOSIS — M7061 Trochanteric bursitis, right hip: Secondary | ICD-10-CM | POA: Diagnosis not present

## 2016-03-18 MED ORDER — LIDOCAINE HCL 1 % IJ SOLN
3.0000 mL | INTRAMUSCULAR | Status: AC | PRN
  Administered 2016-03-18: 3 mL

## 2016-03-18 MED ORDER — METHYLPREDNISOLONE ACETATE 40 MG/ML IJ SUSP
40.0000 mg | INTRAMUSCULAR | Status: AC | PRN
  Administered 2016-03-18: 40 mg via INTRA_ARTICULAR

## 2016-03-18 NOTE — Progress Notes (Signed)
Office Visit Note   Patient: Molly Dillon           Date of Birth: [birth year]           MRN: [id number] Visit Date: 03/18/2016              Requested by: Trowbridge Bing, DO [address] [place], [place] [address] PCP: Curran Bing, DO   Assessment & Plan: Visit Diagnoses:  1. Trochanteric bursitis, left hip   2. Trochanteric bursitis, right hip     Plan: She tolerated the steroid injections over each trochanteric area well. I'll see her back in 6 weeks. If she's not getting much relief at that point I would like an MRI of her pelvis to assess both hips to rule out avascular necrosis.  Follow-Up Instructions: Return in about 6 weeks (around 04/29/2016).   Orders:  No orders of the defined types were placed in this encounter.  No orders of the defined types were placed in this encounter.     Procedures: Large Joint Inj Date/Time: 03/18/2016 2:28 PM Performed by: Mcarthur Rossetti Authorized by: Jean Rosenthal Y   Location:  Hip Site:  R greater trochanter Ultrasound Guidance: No   Fluoroscopic Guidance: No   Arthrogram: No   Medications:  3 mL lidocaine 1 %; 40 mg methylPREDNISolone acetate 40 MG/ML Large Joint Inj Date/Time: 03/18/2016 2:28 PM Performed by: Mcarthur Rossetti Authorized by: Mcarthur Rossetti   Location:  Hip Site:  L greater trochanter Ultrasound Guidance: No   Fluoroscopic Guidance: No   Arthrogram: No   Medications:  3 mL lidocaine 1 %; 40 mg methylPREDNISolone acetate 40 MG/ML     Clinical Data: No additional findings.   Subjective: No chief complaint on file. The patient is following up for bilateral hip pain. I feel that her chief problem is trochanteric bursitis of both her hips. The steroid taper we put her on did not work much. She does want to try injections. She is HIV positive but under good control of this. Her plain films did not show evidence of avascular necrosis of her hips. Her pain to his belt  sides of her hips and not in her groin.  HPI  Review of Systems   Objective: Vital Signs: LMP 01/12/2011   Physical Exam She is alert and oriented 3 and in no acute distress Ortho Exam She has fluid range of motion of both her hips with no pain in the groin but definitely pain of the tip of the trochanteric area and IT band on both hips. Specialty Comments:  No specialty comments available.  Imaging: No results found.   PMFS History: Patient Active Problem List   Diagnosis Date Noted  . Trochanteric bursitis, left hip 02/19/2016  . Trochanteric bursitis, right hip 02/19/2016  . Avascular necrosis of femoral head ([place]) 02/04/2016  . Lumbar back pain 11/29/2015  . Depression, major, recurrent, moderate ([place]) 04/02/2015  . Unintentional weight loss 02/13/2015  . Constipation 07/19/2014  . Hypertensive nephropathy 07/19/2014  . Acute renal insufficiency 06/06/2014  . COPD (chronic obstructive pulmonary disease) with acute bronchitis ([place]) 06/06/2014  . Current every day smoker 06/15/2013  . Loss of weight 09/14/2012  . FIBROIDS, UTERUS 06/19/2009  . HYPERCHOLESTEROLEMIA, BORDERLINE, WITH HIGH HDL 06/07/2009  . SINUSITIS, ACUTE 06/07/2009  . DYSFUNCTIONAL UTERINE BLEEDING 06/07/2009  . ABDOMINAL PAIN, RIGHT UPPER QUADRANT 06/07/2009  . Allergic rhinitis 05/09/2009  . ALKALINE PHOSPHATASE, ELEVATED 05/09/2009  . HEMATURIA UNSPECIFIED 05/17/2008  .  Hyperlipidemia 05/03/2008  . HEMOCCULT POSITIVE STOOL 05/03/2008  . MICROSCOPIC HEMATURIA 05/03/2008  . MENOPAUSAL SYNDROME 05/03/2008  . THYROMEGALY 05/03/2007  . PERIPHERAL NEUROPATHY, IDIOPATHIC 04/08/2006  . Human immunodeficiency virus (HIV) disease ([place]) 01/26/2006  . DEPRESSION 01/26/2006  . Essential hypertension 01/26/2006  . Asthma 01/26/2006   Past Medical History:  Diagnosis Date  . Acute renal insufficiency 06/06/2014  . Asthma   . Constipation 07/19/2014  . COPD (chronic obstructive pulmonary disease) with  acute bronchitis ([place]) 06/06/2014  . Depression   . Depression, major, recurrent, moderate ([place]) 04/02/2015  . HIV infection ([place])   . Hyperlipidemia   . Hypertension   . Hypertensive nephropathy 07/19/2014  . Unintentional weight loss 02/13/2015    No family history on file.  No past surgical history on file. Social History   Occupational History  . Not on file.   Social History Main Topics  . Smoking status: Light Tobacco Smoker    Packs/day: 0.25    Years: 20.00    Types: Cigarettes    Last attempt to quit: 07/02/2014  . Smokeless tobacco: Never Used     Comment: down to 5 cigs a day  . Alcohol use Not on file  . Drug use: Unknown  . Sexual activity: Yes    Partners: Male    Birth control/ protection: Condom     Comment: given condoms

## 2016-03-20 ENCOUNTER — Ambulatory Visit (INDEPENDENT_AMBULATORY_CARE_PROVIDER_SITE_OTHER): Payer: Medicare Other | Admitting: Family Medicine

## 2016-03-20 ENCOUNTER — Encounter: Payer: Self-pay | Admitting: Family Medicine

## 2016-03-20 DIAGNOSIS — G894 Chronic pain syndrome: Secondary | ICD-10-CM

## 2016-03-20 DIAGNOSIS — F17200 Nicotine dependence, unspecified, uncomplicated: Secondary | ICD-10-CM

## 2016-03-20 DIAGNOSIS — M545 Low back pain, unspecified: Secondary | ICD-10-CM

## 2016-03-20 MED ORDER — PREGABALIN 75 MG PO CAPS: 75.0000 mg | ORAL_CAPSULE | Freq: Two times a day (BID) | ORAL | 0 refills | Status: DC

## 2016-03-20 MED ORDER — VARENICLINE TARTRATE 1 MG PO TABS: 1.0000 mg | ORAL_TABLET | Freq: Two times a day (BID) | ORAL | 2 refills | Status: DC

## 2016-03-20 MED ORDER — VARENICLINE TARTRATE 0.5 MG PO TABS: ORAL_TABLET | ORAL | 0 refills | Status: DC

## 2016-03-20 MED ORDER — DULOXETINE HCL 30 MG PO CPEP: 60.0000 mg | ORAL_CAPSULE | Freq: Every day | ORAL | 0 refills | Status: DC

## 2016-03-20 NOTE — Progress Notes (Signed)
Subjective:   Patient ID: Molly Dillon    DOB: 1960/12/22, 56 y.o. female   MRN: [id number]  CC: "follow up"  HPI: Molly Dillon is a 56 y.o. female who presents to clinic today for follow up concerning leg pain. Problems discussed today are as follows:  Chronic pain: Pt started lyrica in addition to Cymbalta for chronic diffuse pain, worse in her legs b/l. Pt says she has had significant improvement of her symptoms from 10/10 to 3/10. Able to carry on through the day without limitations. No new concerns. Has tolerated medications well w/o side effects. Recently seen by ortho for suspected AVN of femoral head b/l which has been reviewed and unlikely per ortho by xray imaging. Given steroid shot by ortho with improvement of suspect trochanteric bursitis.  Smoking: Pt smokes 6 cigs per day and is interested in stopping. In good spirits with better control of her pain and motivated. Says she has not tried anything to help with nicotine withdrawal. Interested in Chantix.  Added lyrica to Cymbalta. Should not be taking amitriptyline. Discuss ortho f/u. Trochanteric shot.  ROS: complete ROS performed, see HPI for pertinent ROS.  [place]: HIV, HTN with nephropathy, HLD, COPD, asthma, depression. Smoking status reviewed. Medications reviewed.  Objective:   BP 140/76   Pulse 64   Temp 97.6 F (36.4 C) (Oral)   Wt 145 lb (65.8 kg)   LMP 01/12/2011   SpO2 98%   BMI 24.89 kg/m  Vitals and nursing note reviewed.  General: well nourished, well developed, in no acute distress with non-toxic appearance HEENT: normocephalic, atraumatic, moist mucous membranes Neck: supple, non-tender without lymphadenopathy CV: regular rate and rhythm without murmurs, rubs, or gallops, no lower extremity edema Lungs: clear to auscultation bilaterally with normal work of breathing Abdomen: soft, non-tender, non-distended, normoactive bowel sounds Skin: warm, dry, no rashes or lesions, cap refill < 2  seconds Extremities: warm and well perfused, normal tone  Assessment & Plan:   Tobacco use disorder Chronic. Wants to stop. No previous attempts. At 6 cigs daily. --Educated on importance of smoking cessation --Initiating Chantix, given info handout on medication --Told to call 1-800-QUIT-NOW if wants additional help --RTC in 1 month  Chronic pain disorder Chronic. Involving legs mostly b/l throughout and includes UE and back at multiple levels. Has h/o depression, and tobacco use disorder. Pain seems to to be responding to Cymbalta and lyrica. --Continue Cymbalta 60 mg QD --Continue Lyrica 75 mg QD --F/u ortho concerning b/l trochanteric bursitis  No orders of the defined types were placed in this encounter.  Meds ordered this encounter  Medications  . varenicline (CHANTIX) 0.5 MG tablet    Sig: Take 1 tab daily for 3 days, then 2 tabs daily for 4 days.    Dispense:  11 tablet    Refill:  0  . varenicline (CHANTIX CONTINUING MONTH PAK) 1 MG tablet    Sig: Take 1 tablet (1 mg total) by mouth 2 (two) times daily. Take 1 tab twice per day.    Dispense:  180 tablet    Refill:  2  . DULoxetine (CYMBALTA) 30 MG capsule    Sig: Take 2 capsules (60 mg total) by mouth daily.    Dispense:  60 capsule    Refill:  0  . pregabalin (LYRICA) 75 MG capsule    Sig: Take 1 capsule (75 mg total) by mouth 2 (two) times daily.    Dispense:  60 capsule    Refill:  0  Harriet Butte, [place], PGY-1 03/22/2016 1:50 PM

## 2016-03-20 NOTE — Patient Instructions (Signed)
Thank you for coming in to see [place] today. Please see below to review our plan for today's visit.  1. Please follow up with the orthopedic surgeon. I will refill your lyrica and Cymbalta for pain. I am glad it has improved. 2. I have written a prescription for Chantix (see below for information). You have been given 2 prescriptions for this medication. The first is 0.5 mg which she will take daily for 3 days followed by twice daily for 4 days. At this point you will stop all cigarettes approximately one week after initiating medication. From that point on you can take the other prescription 1 mg as instructed twice daily.  3. Return to clinic in 4 weeks.   Please call the clinic at [phone] if your symptoms worsen or you have any concerns. It was my pleasure to see you. -- Harriet Butte, [place], PGY-1  Varenicline oral tablets What is this medicine? VARENICLINE (var EN i kleen) is used to help people quit smoking. It can reduce the symptoms caused by stopping smoking. It is used with a patient support program recommended by your physician. This medicine may be used for other purposes; ask your health care provider or pharmacist if you have questions. COMMON BRAND NAME(S): Chantix What should I tell my health care provider before I take this medicine? They need to know if you have any of these conditions: -bipolar disorder, depression, schizophrenia or other mental illness -heart disease -if you often drink alcohol -kidney disease -peripheral vascular disease -seizures -stroke -suicidal thoughts, plans, or attempt; a previous suicide attempt by you or a family member -an unusual or allergic reaction to varenicline, other medicines, foods, dyes, or preservatives -pregnant or trying to get pregnant -breast-feeding How should I use this medicine? Take this medicine by mouth after eating. Take with a full glass of water. Follow the directions on the prescription  label. Take your doses at regular intervals. Do not take your medicine more often than directed. There are 3 ways you can use this medicine to help you quit smoking; talk to your health care professional to decide which plan is right for you: 1) you can choose a quit date and start this medicine 1 week before the quit date, or, 2) you can start taking this medicine before you choose a quit date, and then pick a quit date between day 8 and 35 days of treatment, or, 3) if you are not sure that you are able or willing to quit smoking right away, start taking this medicine and slowly decrease the amount you smoke as directed by your health care professional with the goal of being cigarette-free by week 12 of treatment. Stick to your plan; ask about support groups or other ways to help you remain cigarette-free. If you are motivated to quit smoking and did not succeed during a previous attempt with this medicine for reasons other than side effects, or if you returned to smoking after this treatment, speak with your health care professional about whether another course of this medicine may be right for you. A special MedGuide will be given to you by the pharmacist with each prescription and refill. Be sure to read this information carefully each time. Talk to your pediatrician regarding the use of this medicine in children. This medicine is not approved for use in children. Overdosage: If you think you have taken too much of this medicine contact a poison control center or emergency room at once. NOTE:  This medicine is only for you. Do not share this medicine with others. What if I miss a dose? If you miss a dose, take it as soon as you can. If it is almost time for your next dose, take only that dose. Do not take double or extra doses. What may interact with this medicine? -alcohol or any product that contains alcohol -insulin -other stop smoking aids -theophylline -warfarin This list may not describe  all possible interactions. Give your health care provider a list of all the medicines, herbs, non-prescription drugs, or dietary supplements you use. Also tell them if you smoke, drink alcohol, or use illegal drugs. Some items may interact with your medicine. What should I watch for while using this medicine? Visit your doctor or health care professional for regular check ups. Ask for ongoing advice and encouragement from your doctor or healthcare professional, friends, and family to help you quit. If you smoke while on this medication, quit again Your mouth may get dry. Chewing sugarless gum or sucking hard candy, and drinking plenty of water may help. Contact your doctor if the problem does not go away or is severe. You may get drowsy or dizzy. Do not drive, use machinery, or do anything that needs mental alertness until you know how this medicine affects you. Do not stand or sit up quickly, especially if you are an older patient. This reduces the risk of dizzy or fainting spells. Sleepwalking can happen during treatment with this medicine, and can sometimes lead to behavior that is harmful to you, other people, or property. Stop taking this medicine and tell your doctor if you start sleepwalking or have other unusual sleep-related activity. Decrease the amount of alcoholic beverages that you drink during treatment with this medicine until you know if this medicine affects your ability to tolerate alcohol. Some people have experienced increased drunkenness (intoxication), unusual or sometimes aggressive behavior, or no memory of things that have happened (amnesia) during treatment with this medicine. The use of this medicine may increase the chance of suicidal thoughts or actions. Pay special attention to how you are responding while on this medicine. Any worsening of mood, or thoughts of suicide or dying should be reported to your health care professional right away. What side effects may I notice from  receiving this medicine? Side effects that you should report to your doctor or health care professional as soon as possible: -allergic reactions like skin rash, itching or hives, swelling of the face, lips, tongue, or throat -acting aggressive, being angry or violent, or acting on dangerous impulses -breathing problems -changes in vision -chest pain or chest tightness -confusion, trouble speaking or understanding -new or worsening depression, anxiety, or panic attacks -extreme increase in activity and talking (mania) -fast, irregular heartbeat -feeling faint or lightheaded, falls -fever -pain in legs when walking -problems with balance, talking, walking -redness, blistering, peeling or loosening of the skin, including inside the mouth -ringing in ears -seeing or hearing things that aren't there (hallucinations) -seizures -sleepwalking -sudden numbness or weakness of the face, arm or leg -thoughts about suicide or dying, or attempts to commit suicide -trouble passing urine or change in the amount of urine -unusual bleeding or bruising -unusually weak or tired Side effects that usually do not require medical attention (report to your doctor or health care professional if they continue or are bothersome): -constipation -headache -nausea, vomiting -strange dreams -stomach gas -trouble sleeping This list may not describe all possible side effects. Call your doctor for medical  advice about side effects. You may report side effects to FDA at 1-800-FDA-1088. Where should I keep my medicine? Keep out of the reach of children. Store at room temperature between 15 and 30 degrees C (59 and 86 degrees F). Throw away any unused medicine after the expiration date. NOTE: This sheet is a summary. It may not cover all possible information. If you have questions about this medicine, talk to your doctor, pharmacist, or health care provider.  2018 Elsevier/Gold Standard (2014-09-13 16:14:23)

## 2016-03-22 DIAGNOSIS — G894 Chronic pain syndrome: Secondary | ICD-10-CM | POA: Insufficient documentation

## 2016-03-22 NOTE — Assessment & Plan Note (Signed)
Chronic. Wants to stop. No previous attempts. At 6 cigs daily. --Educated on importance of smoking cessation --Initiating Chantix, given info handout on medication --Told to call 1-800-QUIT-NOW if wants additional help --RTC in 1 month

## 2016-03-22 NOTE — Assessment & Plan Note (Signed)
Chronic. Involving legs mostly b/l throughout and includes UE and back at multiple levels. Has h/o depression, and tobacco use disorder. Pain seems to to be responding to Cymbalta and lyrica. --Continue Cymbalta 60 mg QD --Continue Lyrica 75 mg QD --F/u ortho concerning b/l trochanteric bursitis

## 2016-03-23 ENCOUNTER — Encounter: Payer: Self-pay | Admitting: Family Medicine

## 2016-03-25 ENCOUNTER — Encounter: Payer: Self-pay | Admitting: Family Medicine

## 2016-03-26 ENCOUNTER — Other Ambulatory Visit: Payer: Self-pay | Admitting: Family Medicine

## 2016-03-26 NOTE — Telephone Encounter (Signed)
Pt states insurance didn't approve Chantix, might need prior authorization and if that doesn't work pt would like to try something else. Pt uses [facility] on [address]. ep

## 2016-03-27 ENCOUNTER — Telehealth: Payer: Self-pay | Admitting: Family Medicine

## 2016-03-27 ENCOUNTER — Encounter: Payer: Self-pay | Admitting: Family Medicine

## 2016-03-27 NOTE — Telephone Encounter (Signed)
Pt called about her Rx for Chantix. She states medicaid will not cover it. She really wants to kick the habit.   Please let pt know if there is a generic available or something that medicaid would cover.

## 2016-03-27 NOTE — Telephone Encounter (Signed)
Will wait for prior authorization request from pharmacy. Derl Barrow, RN

## 2016-03-27 NOTE — Telephone Encounter (Signed)
Spoke with patient concerning recent prescription for Chantix. Patient unable to obtain due to not being covered. A committed patient call 1 800 quit now to obtain resources to help with nicotine craving. Also instructed patient to call clinic on Monday and request appointment for smoking cessation with Dr. Valentina Lucks. Patient understanding without further questions. -- Harriet Butte, [place], PGY-1

## 2016-04-02 ENCOUNTER — Ambulatory Visit (INDEPENDENT_AMBULATORY_CARE_PROVIDER_SITE_OTHER): Payer: Medicare Other | Admitting: Pharmacist

## 2016-04-02 ENCOUNTER — Encounter: Payer: Self-pay | Admitting: Pharmacist

## 2016-04-02 DIAGNOSIS — F17200 Nicotine dependence, unspecified, uncomplicated: Secondary | ICD-10-CM

## 2016-04-02 MED ORDER — VARENICLINE TARTRATE 0.5 MG X 11 & 1 MG X 42 PO MISC: ORAL | 0 refills | Status: DC

## 2016-04-02 MED ORDER — NICOTINE 21 MG/24HR TD PT24: 21.0000 mg | MEDICATED_PATCH | Freq: Every day | TRANSDERMAL | 2 refills | Status: DC

## 2016-04-02 MED ORDER — BUPROPION HCL ER (SR) 150 MG PO TB12: 150.0000 mg | ORAL_TABLET | Freq: Every day | ORAL | 1 refills | Status: DC

## 2016-04-02 NOTE — Assessment & Plan Note (Signed)
Severe Nicotine Dependence of 36 years duration in a patient who is excellent candidate for success b/c of great motivation and confidence. She has been attempting to pick up her Chantix prescription for the past week but was unable to obtain due to medicaid/medicare prior authorization issues. Patient set quit date of April 26, 2016.  After determining Chantix required prior authorization, decided to initiate bupropion SR 150 mg daily and NRT patch 21 mg. . Counseled patient on bupropion side effect of insomnia. Discussed patient's distant childhood seizure history that was treated with medication years ago. Patient reports no recent seizure activity.  Patient educated on purpose, proper use and potential adverse effects of NRT patch.  Following instruction patient verbalized understanding of treatment plan.

## 2016-04-02 NOTE — Progress Notes (Signed)
   S:  Patient arrives in good spirits ready to quit smoking. Patient arrives for evaluation/assistance with tobacco dependence.  Patient was referred on 03/20/2016.  Patient was last seen by Primary Care Provider on 03/20/2016. Patient has called 1-800-QUIT-NOW and said they were very supportive and she has set a quit date of April 26, 2016.   Age when started using tobacco on a daily basis 56 yo. Number of Cigarettes per day; previously 5 per day increased to 17 per day ~ 5 years ago. Brand smoked [place] 100s. Estimated Nicotine Content per Cigarette (mg) 1.2.  Estimated Nicotine intake per day ~20mg .   Smokes first cigarette 15 minutes after waking. Denies waking to smoke   Estimated Fagerstrom Score >5/10.   Most recent quit attempt 7 years ago Longest time ever been tobacco free 2 weeks cold [place].  Medications (NRT, bupropion, varenicline) used in prior in past cessation efforts include: None.   Rates IMPORTANCE of quitting tobacco on 1-10 scale of 10. Rates CONFIDENCE of quitting tobacco on 1-10 scale of 10.  Most common triggers to use tobacco include; After meals and stress   Motivation to quit: Health benefits, pt is tired of smoking and husband doesn't like her smoking.    A/P: Severe Nicotine Dependence of 36 years duration in a patient who is excellent candidate for success b/c of great motivation and confidence. She has been attempting to pick up her Chantix prescription for the past week but was unable to obtain due to medicaid/medicare prior authorization issues. Patient set quit date of April 26, 2016.  After determining Chantix required prior authorization, decided to initiate bupropion SR 150 mg daily and NRT patch 21 mg. . Counseled patient on bupropion side effect of insomnia. Discussed patient's distant childhood seizure history that was treated with medication years ago. Patient reports no recent seizure activity.  Patient educated on purpose, proper use and potential  adverse effects of NRT patch.  Following instruction patient verbalized understanding of treatment plan.     Written information provided.  F/U phone call in 1 week.  F/U Rx Clinic Visit April 27, 2016   Total time in face-to-face counseling 30 minutes.  Patient seen with Myrna Blazer, PharmD Candidate, Ida Rogue, PharmD Candidate,. and Bennye Alm, PharmD, BCPS, PGY2 Resident.

## 2016-04-02 NOTE — Patient Instructions (Addendum)
Thank you for coming in today! We are so excited for you to quit smoking. Start using the nicotine patches and start taking bupropion SR 150 mg daily. You have set a quit date of April 26, 2016. We will see you back on April 16 at 10:30.

## 2016-04-03 NOTE — Progress Notes (Signed)
Patient ID: Molly Dillon, female   DOB: 11/27/1960, 56 y.o.   MRN: [id number] Reviewed: Agree with Dr. Graylin Shiver documentation and management.

## 2016-04-22 NOTE — Progress Notes (Signed)
   Subjective:   Patient ID: Molly Dillon    DOB: 18-Mar-1960, 56 y.o. female   MRN: [id number]  CC: "Smoking"  HPI: Molly Dillon is a 56 y.o. female who presents to clinic today for follow-up concerning smoking cessation. Problems discussed today are as follows:  Smoking: Patient has successfully quit cigarette use since last visit. Attempted Wellbutrin, however experiencing nausea side effect. Patient has discontinued use of this medication. Patient using nicotine patch along with the use approximately once per day in substitute for the circumference. Patient is very pleased with outcome thus far.  Chronic pain: Pain is well controlled on Lyrica and Cymbalta. Patient requesting refills on medications. Patient denies symptoms of depression or fatigue. Patient says energy level is back to baseline since starting these medications.  ROS: complete ROS performed, see HPI for pertinent ROS.  [place]: HIV, HTN with nephropathy, HLD, COPD, asthma, depression. Smoking status reviewed. Medications reviewed.  Objective:   BP 122/84   Pulse 99   Temp 98.1 F (36.7 C) (Oral)   Ht 5\' 4"  (1.626 m)   Wt 159 lb 6.4 oz (72.3 kg)   LMP 01/12/2011   SpO2 96%   BMI 27.36 kg/m  Vitals and nursing note reviewed.  General: well nourished, well developed, in no acute distress with non-toxic appearance HEENT: normocephalic, atraumatic, moist mucous membranes Neck: supple, non-tender without lymphadenopathy CV: regular rate and rhythm without murmurs, rubs, or gallops, no lower extremity edema Lungs: clear to auscultation bilaterally with normal work of breathing Abdomen: soft, non-tender, non-distended, no masses or organomegaly palpable, normoactive bowel sounds Skin: warm, dry, no rashes or lesions, cap refill < 2 seconds Extremities: warm and well perfused, normal tone  Assessment & Plan:   Tobacco use disorder Chronic. Patient is now off cigarette use. Attempted Wellbutrin but discontinued due to  nausea side effect. Now on nicotine replacement patch and occasional use of VAP once daily. Nonproductive cough and stopping. --Follow-up with Dr. Valentina Lucks 4/16 --Continue using nicotine 21 mg patch --Discussed VAP use with encouragement to eventually discontinue use  Chronic pain disorder Chronic. Controlled with Lyrica and Cymbalta. ADL at baseline without restriction. --Refill 1 month supply of Lyrica and Cymbalta --Patient to return to clinic in 3 months  No orders of the defined types were placed in this encounter.  Meds ordered this encounter  Medications  . pregabalin (LYRICA) 75 MG capsule    Sig: Take 1 capsule (75 mg total) by mouth 2 (two) times daily.    Dispense:  60 capsule    Refill:  0  . DULoxetine (CYMBALTA) 30 MG capsule    Sig: Take 2 capsules (60 mg total) by mouth daily.    Dispense:  60 capsule    Refill:  0    Harriet Butte, [place], PGY-1 04/23/2016 5:13 PM

## 2016-04-23 ENCOUNTER — Ambulatory Visit (INDEPENDENT_AMBULATORY_CARE_PROVIDER_SITE_OTHER): Payer: Medicare Other | Admitting: Family Medicine

## 2016-04-23 ENCOUNTER — Encounter: Payer: Self-pay | Admitting: Family Medicine

## 2016-04-23 DIAGNOSIS — F17200 Nicotine dependence, unspecified, uncomplicated: Secondary | ICD-10-CM

## 2016-04-23 DIAGNOSIS — M545 Low back pain, unspecified: Secondary | ICD-10-CM

## 2016-04-23 DIAGNOSIS — G894 Chronic pain syndrome: Secondary | ICD-10-CM | POA: Diagnosis not present

## 2016-04-23 MED ORDER — PREGABALIN 75 MG PO CAPS: 75.0000 mg | ORAL_CAPSULE | Freq: Two times a day (BID) | ORAL | 0 refills | Status: DC

## 2016-04-23 MED ORDER — DULOXETINE HCL 30 MG PO CPEP: 60.0000 mg | ORAL_CAPSULE | Freq: Every day | ORAL | 0 refills | Status: DC

## 2016-04-23 NOTE — Patient Instructions (Signed)
Thank you for coming in to see [place] today. Please see below to review our plan for today's visit.  1. Congratulations on cutting back on all of your cigarette use! Please continue using your nicotine patch. Stop your bupropion use as it seems that it causes nausea for you. I will forward this encounter to Dr. Valentina Lucks that he is aware. I am okay with you using the vapor as substitute for your smoking, however please try to use this minimally with a goal to completely stop. 2. I have been your refill on her Lyrica and Cymbalta. When you need refills, please notify the clinic and I can print these out for you to come pick up in one month. 3. Return to clinic in 3 months.  Please call the clinic at [phone] if your symptoms worsen or you have any concerns. It was my pleasure to see you. -- Harriet Butte, [place], PGY-1

## 2016-04-23 NOTE — Assessment & Plan Note (Signed)
Chronic. Controlled with Lyrica and Cymbalta. ADL at baseline without restriction. --Refill 1 month supply of Lyrica and Cymbalta --Patient to return to clinic in 3 months

## 2016-04-23 NOTE — Assessment & Plan Note (Signed)
Chronic. Patient is now off cigarette use. Attempted Wellbutrin but discontinued due to nausea side effect. Now on nicotine replacement patch and occasional use of VAP once daily. Nonproductive cough and stopping. --Follow-up with Dr. Valentina Lucks 4/16 --Continue using nicotine 21 mg patch --Discussed VAP use with encouragement to eventually discontinue use

## 2016-04-27 ENCOUNTER — Ambulatory Visit: Payer: Medicare Other | Admitting: Pharmacist

## 2016-04-29 ENCOUNTER — Ambulatory Visit (INDEPENDENT_AMBULATORY_CARE_PROVIDER_SITE_OTHER): Payer: Medicare Other | Admitting: Orthopaedic Surgery

## 2016-05-07 ENCOUNTER — Ambulatory Visit: Payer: Medicare Other | Admitting: Pharmacist

## 2016-05-14 ENCOUNTER — Ambulatory Visit: Payer: Medicare Other | Admitting: Pharmacist

## 2016-05-15 ENCOUNTER — Other Ambulatory Visit: Payer: Self-pay | Admitting: *Deleted

## 2016-05-15 DIAGNOSIS — E7849 Other hyperlipidemia: Secondary | ICD-10-CM

## 2016-05-15 MED ORDER — PRAVASTATIN SODIUM 20 MG PO TABS: 20.0000 mg | ORAL_TABLET | Freq: Every day | ORAL | 1 refills | Status: DC

## 2016-05-20 ENCOUNTER — Ambulatory Visit (INDEPENDENT_AMBULATORY_CARE_PROVIDER_SITE_OTHER): Payer: Medicare Other | Admitting: Orthopaedic Surgery

## 2016-05-26 ENCOUNTER — Ambulatory Visit: Payer: Medicare Other | Admitting: Pharmacist

## 2016-05-26 ENCOUNTER — Telehealth: Payer: Self-pay | Admitting: Pharmacist

## 2016-05-26 MED ORDER — NICOTINE POLACRILEX 4 MG MT GUM: 4.0000 mg | CHEWING_GUM | OROMUCOSAL | 1 refills | Status: DC | PRN

## 2016-05-26 NOTE — Telephone Encounter (Signed)
Called in follow-up of smoking cessation attempt and missed Rx clinic appointment.  Quit smoking - last cigarette 4/15.   Encouraged continued abstinence.  Patient expressed desire to continue use of bupropion.  Patient also desires switch from patch to gum.  Discussed use of gum including using 1/2 piece of 4mg  gum.   Follow up with Dr. Yisroel Ramming.

## 2016-05-26 NOTE — Telephone Encounter (Signed)
Noted and agree. 

## 2016-05-27 ENCOUNTER — Encounter: Payer: Self-pay | Admitting: Family Medicine

## 2016-05-28 ENCOUNTER — Other Ambulatory Visit: Payer: Self-pay | Admitting: Family Medicine

## 2016-05-28 DIAGNOSIS — M545 Low back pain, unspecified: Secondary | ICD-10-CM

## 2016-05-28 DIAGNOSIS — R21 Rash and other nonspecific skin eruption: Secondary | ICD-10-CM

## 2016-05-28 MED ORDER — TRIAMCINOLONE ACETONIDE 0.5 % EX OINT: 1.0000 "application " | TOPICAL_OINTMENT | Freq: Two times a day (BID) | CUTANEOUS | 3 refills | Status: DC

## 2016-05-28 MED ORDER — DULOXETINE HCL 30 MG PO CPEP: 60.0000 mg | ORAL_CAPSULE | Freq: Every day | ORAL | 0 refills | Status: DC

## 2016-05-28 MED ORDER — PREGABALIN 75 MG PO CAPS: 75.0000 mg | ORAL_CAPSULE | Freq: Two times a day (BID) | ORAL | 0 refills | Status: DC

## 2016-06-03 LAB — GLUCOSE, POCT (MANUAL RESULT ENTRY): POC GLUCOSE: 90 mg/dL (ref 70–99)

## 2016-06-10 ENCOUNTER — Other Ambulatory Visit: Payer: Medicare Other

## 2016-06-15 ENCOUNTER — Other Ambulatory Visit: Payer: Medicare Other

## 2016-06-15 DIAGNOSIS — Z79899 Other long term (current) drug therapy: Secondary | ICD-10-CM | POA: Diagnosis not present

## 2016-06-15 DIAGNOSIS — B20 Human immunodeficiency virus [HIV] disease: Secondary | ICD-10-CM

## 2016-06-15 LAB — CBC WITH DIFFERENTIAL/PLATELET
Basophils Absolute: 88 cells/uL (ref 0–200)
Basophils Relative: 1 %
EOS ABS: 176 {cells}/uL (ref 15–500)
Eosinophils Relative: 2 %
HEMATOCRIT: 42 % (ref 35.0–45.0)
Hemoglobin: 13.9 g/dL (ref 11.7–15.5)
LYMPHS PCT: 45 %
Lymphs Abs: 3960 cells/uL — ABNORMAL HIGH (ref 850–3900)
MCH: 32.8 pg (ref 27.0–33.0)
MCHC: 33.1 g/dL (ref 32.0–36.0)
MCV: 99.1 fL (ref 80.0–100.0)
MONO ABS: 616 {cells}/uL (ref 200–950)
MONOS PCT: 7 %
MPV: 9.1 fL (ref 7.5–12.5)
NEUTROS PCT: 45 %
Neutro Abs: 3960 cells/uL (ref 1500–7800)
PLATELETS: 250 10*3/uL (ref 140–400)
RBC: 4.24 MIL/uL (ref 3.80–5.10)
RDW: 17.4 % — ABNORMAL HIGH (ref 11.0–15.0)
WBC: 8.8 10*3/uL (ref 3.8–10.8)

## 2016-06-15 LAB — LIPID PANEL
CHOL/HDL RATIO: 4.4 ratio (ref <5.0)
CHOLESTEROL: 227 mg/dL — AB (ref <200)
HDL: 52 mg/dL (ref >50)
LDL Cholesterol: 134 mg/dL — ABNORMAL HIGH (ref <100)
Triglycerides: 204 mg/dL — ABNORMAL HIGH (ref <150)
VLDL: 41 mg/dL — ABNORMAL HIGH (ref <30)

## 2016-06-15 LAB — BASIC METABOLIC PANEL
BUN: 20 mg/dL (ref 7–25)
CALCIUM: 9.1 mg/dL (ref 8.6–10.4)
CO2: 27 mmol/L (ref 20–31)
CREATININE: 1.55 mg/dL — AB (ref 0.50–1.05)
Chloride: 107 mmol/L (ref 98–110)
Glucose, Bld: 79 mg/dL (ref 65–99)
Potassium: 4.7 mmol/L (ref 3.5–5.3)
Sodium: 142 mmol/L (ref 135–146)

## 2016-06-16 LAB — T-HELPER CELL (CD4) - (RCID CLINIC ONLY)
CD4 T CELL ABS: 880 /uL (ref 400–2700)
CD4 T CELL HELPER: 22 % — AB (ref 33–55)

## 2016-06-17 LAB — HIV-1 RNA QUANT-NO REFLEX-BLD
HIV 1 RNA QUANT: NOT DETECTED {copies}/mL
HIV-1 RNA QUANT, LOG: NOT DETECTED {Log_copies}/mL

## 2016-06-24 ENCOUNTER — Ambulatory Visit (INDEPENDENT_AMBULATORY_CARE_PROVIDER_SITE_OTHER): Payer: Medicare Other | Admitting: Infectious Disease

## 2016-06-24 ENCOUNTER — Encounter: Payer: Self-pay | Admitting: Infectious Disease

## 2016-06-24 VITALS — BP 166/96 | HR 94 | Temp 98.1°F | Ht 64.0 in | Wt 172.0 lb

## 2016-06-24 DIAGNOSIS — M7062 Trochanteric bursitis, left hip: Secondary | ICD-10-CM | POA: Diagnosis not present

## 2016-06-24 DIAGNOSIS — B20 Human immunodeficiency virus [HIV] disease: Secondary | ICD-10-CM

## 2016-06-24 DIAGNOSIS — J440 Chronic obstructive pulmonary disease with acute lower respiratory infection: Secondary | ICD-10-CM | POA: Diagnosis present

## 2016-06-24 DIAGNOSIS — J4541 Moderate persistent asthma with (acute) exacerbation: Secondary | ICD-10-CM

## 2016-06-24 DIAGNOSIS — J209 Acute bronchitis, unspecified: Secondary | ICD-10-CM

## 2016-06-24 DIAGNOSIS — M545 Low back pain, unspecified: Secondary | ICD-10-CM

## 2016-06-24 DIAGNOSIS — R634 Abnormal weight loss: Secondary | ICD-10-CM | POA: Diagnosis not present

## 2016-06-24 DIAGNOSIS — Z113 Encounter for screening for infections with a predominantly sexual mode of transmission: Secondary | ICD-10-CM | POA: Diagnosis not present

## 2016-06-24 DIAGNOSIS — I129 Hypertensive chronic kidney disease with stage 1 through stage 4 chronic kidney disease, or unspecified chronic kidney disease: Secondary | ICD-10-CM

## 2016-06-24 DIAGNOSIS — M7061 Trochanteric bursitis, right hip: Secondary | ICD-10-CM | POA: Diagnosis not present

## 2016-06-24 DIAGNOSIS — F17200 Nicotine dependence, unspecified, uncomplicated: Secondary | ICD-10-CM

## 2016-06-24 DIAGNOSIS — Z79899 Other long term (current) drug therapy: Secondary | ICD-10-CM | POA: Diagnosis not present

## 2016-06-24 DIAGNOSIS — J45901 Unspecified asthma with (acute) exacerbation: Secondary | ICD-10-CM | POA: Insufficient documentation

## 2016-06-24 DIAGNOSIS — I10 Essential (primary) hypertension: Secondary | ICD-10-CM

## 2016-06-24 MED ORDER — FLUTICASONE-SALMETEROL 250-50 MCG/DOSE IN AEPB: 1.0000 | INHALATION_SPRAY | Freq: Two times a day (BID) | RESPIRATORY_TRACT | 11 refills | Status: DC

## 2016-06-24 MED ORDER — PREDNISONE 20 MG PO TABS: 60.0000 mg | ORAL_TABLET | Freq: Every day | ORAL | 1 refills | Status: AC

## 2016-06-24 MED ORDER — ALBUTEROL SULFATE HFA 108 (90 BASE) MCG/ACT IN AERS: 2.0000 | INHALATION_SPRAY | Freq: Four times a day (QID) | RESPIRATORY_TRACT | 11 refills | Status: DC | PRN

## 2016-06-24 NOTE — Progress Notes (Signed)
Subjective:   Chief complaint follow up for HIV also with asthma exacerbation  Patient ID: Molly Dillon, female    DOB: 03-06-1960, 56 y.o.   MRN: [id number]  HPI   56 year old with HIV, Hypertension ahypertensive nephropathy, asthma and COPD   She remains with excellent adherence to Antivert of all medication with perfect virological suppression and healthy immune maintenance.  She is wheezing on exam and her asthma is clearly not well controlled. She claims to be taking her albuterol inhaler as well as her inhaled corticosteroid on a bit skeptical with regards to her adherence to these medications. I will give her a seven-day course of prednisone.  Lab Results  Component Value Date   HIV1RNAQUANT <20 NOT DETECTED 06/15/2016   HIV1RNAQUANT <20 11/13/2015   HIV1RNAQUANT <20 07/23/2015   Lab Results  Component Value Date   CD4TABS 880 06/15/2016   CD4TABS 620 11/13/2015   CD4TABS 570 07/23/2015        Past Medical History:  Diagnosis Date  . Acute renal insufficiency 06/06/2014  . Asthma   . Constipation 07/19/2014  . COPD (chronic obstructive pulmonary disease) with acute bronchitis ([place]) 06/06/2014  . Depression   . Depression, major, recurrent, moderate ([place]) 04/02/2015  . HIV infection ([place])   . Hyperlipidemia   . Hypertension   . Hypertensive nephropathy 07/19/2014  . Unintentional weight loss 02/13/2015    No past surgical history on file.  No family history on file.    Social History   Social History  . Marital status: Married    Spouse name: N/A  . Number of children: N/A  . Years of education: N/A   Social History Main Topics  . Smoking status: Heavy Tobacco Smoker    Packs/day: 1.00    Years: 36.00    Types: Cigarettes    Start date: 01/13/1980    Last attempt to quit: 07/02/2014  . Smokeless tobacco: Never Used     Comment: down to 5 cigs a day  . Alcohol use None  . Drug use: Unknown  . Sexual activity: Yes    Partners: Male    Birth  control/ protection: Condom     Comment: given condoms   Other Topics Concern  . None   Social History Narrative  . None    Allergies  Allergen Reactions  . Dapsone Shortness Of Breath  . Penicillins Hives and Shortness Of Breath     Current Outpatient Prescriptions:  .  abacavir-dolutegravir-lamiVUDine (TRIUMEQ) 600-50-300 MG tablet, Take 1 tablet by mouth daily., Disp: 30 tablet, Rfl: 5 .  albuterol (PROVENTIL HFA;VENTOLIN HFA) 108 (90 Base) MCG/ACT inhaler, Inhale 2 puffs into the lungs every 6 (six) hours as needed for wheezing., Disp: 1 Inhaler, Rfl: 11 .  amLODipine (NORVASC) 10 MG tablet, Take 1 tablet (10 mg total) by mouth daily., Disp: 30 tablet, Rfl: 5 .  buPROPion (WELLBUTRIN SR) 150 MG 12 hr tablet, Take 1 tablet (150 mg total) by mouth daily. (Patient not taking: Reported on 04/23/2016), Disp: 30 tablet, Rfl: 1 .  diclofenac (VOLTAREN) 75 MG EC tablet, Take 1 tablet (75 mg total) by mouth 2 (two) times daily between meals as needed., Disp: 60 tablet, Rfl: 3 .  DULoxetine (CYMBALTA) 30 MG capsule, Take 2 capsules (60 mg total) by mouth daily., Disp: 60 capsule, Rfl: 0 .  Fluticasone-Salmeterol (ADVAIR DISKUS) 250-50 MCG/DOSE AEPB, Inhale 1 puff into the lungs 2 (two) times daily., Disp: 60 each, Rfl: 5 .  mirtazapine (REMERON) 45 MG tablet, Take 1 tablet (45 mg total) by mouth at bedtime., Disp: 30 tablet, Rfl: 5 .  Multiple Vitamin (MULTIVITAMIN) tablet, Take 1 tablet by mouth daily., Disp: , Rfl:  .  nicotine (NICODERM CQ - DOSED IN MG/24 HOURS) 21 mg/24hr patch, Place 1 patch (21 mg total) onto the skin daily. May substitute formulary preferred, Disp: 28 patch, Rfl: 2 .  nicotine polacrilex (NICORETTE) 4 MG gum, Take 1 each (4 mg total) by mouth as needed for smoking cessation., Disp: 100 tablet, Rfl: 1 .  pravastatin (PRAVACHOL) 20 MG tablet, Take 1 tablet (20 mg total) by mouth daily. Reported on 04/02/2015, Disp: 90 tablet, Rfl: 1 .  pregabalin (LYRICA) 75 MG capsule,  Take 1 capsule (75 mg total) by mouth 2 (two) times daily., Disp: 60 capsule, Rfl: 0 .  triamcinolone ointment (KENALOG) 0.5 %, Apply 1 application topically 2 (two) times daily., Disp: 30 g, Rfl: 3   Review of Systems  Constitutional: Negative for activity change, chills, diaphoresis and fever.  HENT: Negative for congestion, rhinorrhea, sinus pressure, sneezing, sore throat and trouble swallowing.   Eyes: Negative for photophobia and visual disturbance.  Respiratory: Positive for cough and shortness of breath. Negative for chest tightness, wheezing and stridor.   Cardiovascular: Negative for chest pain, palpitations and leg swelling.  Gastrointestinal: Negative for abdominal distention, abdominal pain, anal bleeding, blood in stool, diarrhea, nausea and vomiting.  Genitourinary: Negative for difficulty urinating, dysuria, flank pain and hematuria.  Musculoskeletal: Positive for back pain. Negative for arthralgias, gait problem and joint swelling.  Skin: Negative for color change, pallor and wound.  Neurological: Negative for dizziness, tremors, weakness and light-headedness.  Hematological: Negative for adenopathy. Does not bruise/bleed easily.  Psychiatric/Behavioral: Negative for agitation, behavioral problems, confusion, dysphoric mood and sleep disturbance.       Objective:   Physical Exam  Constitutional: She is oriented to person, place, and time. She appears well-developed and well-nourished. No distress.  HENT:  Head: Normocephalic and atraumatic.  Mouth/Throat: No oropharyngeal exudate.  Eyes: Conjunctivae and EOM are normal. No scleral icterus.  Neck: Normal range of motion. Neck supple.  Cardiovascular: Normal rate, regular rhythm and normal heart sounds.  Exam reveals no gallop and no friction rub.   No murmur heard. Pulmonary/Chest: Effort normal. No respiratory distress. She has wheezes. She has no rales.  Abdominal: Soft. She exhibits no distension. There is no  tenderness.  Musculoskeletal: She exhibits no edema or tenderness.       Right hip: She exhibits normal range of motion.       Left hip: She exhibits normal range of motion.     Neurological: She is alert and oriented to person, place, and time. She exhibits normal muscle tone. Coordination normal.  Skin: Skin is warm and dry. She is not diaphoretic. No erythema. No pallor.  Psychiatric: She has a normal mood and affect. Her behavior is normal. Judgment and thought content normal.         Assessment & Plan:   COPD:with asthma, continue inhaled corticosteroids and prn broncholdilators., And I'm giving her 7 day course of prednisone   HIV:  Continue TRIUMEQ recheck labs   in 6 months.   Hypertensive nephropathy and now CKD: continue amlodipine,  Vitals:   06/24/16 0957  BP: (!) 166/96  Pulse: 94  Temp: 98.1 F (36.7 C)   Lab Results  Component Value Date   CREATININE 1.55 (H) 06/15/2016   CREATININE 1.43 (H) 11/13/2015  CREATININE 1.27 (H) 07/23/2015    Smoking: He says she has quit for last few months.   I spent greater than 25 minutes with the patient including greater than 50% of time in face to face counsel of the patient re her HIV,  asthma smoking depression, smoking , and in coordination of her care.

## 2016-07-02 ENCOUNTER — Encounter: Payer: Self-pay | Admitting: Family Medicine

## 2016-07-03 ENCOUNTER — Other Ambulatory Visit: Payer: Self-pay | Admitting: Family Medicine

## 2016-07-03 DIAGNOSIS — M545 Low back pain, unspecified: Secondary | ICD-10-CM

## 2016-07-03 MED ORDER — PREGABALIN 75 MG PO CAPS: 75.0000 mg | ORAL_CAPSULE | Freq: Two times a day (BID) | ORAL | 2 refills | Status: DC

## 2016-07-03 MED ORDER — DULOXETINE HCL 30 MG PO CPEP: 60.0000 mg | ORAL_CAPSULE | Freq: Every day | ORAL | 2 refills | Status: DC

## 2016-08-06 ENCOUNTER — Other Ambulatory Visit: Payer: Self-pay | Admitting: Infectious Disease

## 2016-08-06 DIAGNOSIS — F329 Major depressive disorder, single episode, unspecified: Secondary | ICD-10-CM

## 2016-08-06 DIAGNOSIS — F32A Depression, unspecified: Secondary | ICD-10-CM

## 2016-08-25 ENCOUNTER — Encounter: Payer: Self-pay | Admitting: Family Medicine

## 2016-08-26 ENCOUNTER — Encounter: Payer: Self-pay | Admitting: Family Medicine

## 2016-08-27 ENCOUNTER — Other Ambulatory Visit: Payer: Self-pay | Admitting: Family Medicine

## 2016-08-27 DIAGNOSIS — G894 Chronic pain syndrome: Secondary | ICD-10-CM

## 2016-08-27 NOTE — Addendum Note (Signed)
Addended byYisroel Ramming, DAVID J on: 08/27/2016 09:03 AM   Modules accepted: Orders

## 2016-09-07 ENCOUNTER — Other Ambulatory Visit: Payer: Self-pay | Admitting: Infectious Disease

## 2016-09-07 DIAGNOSIS — B20 Human immunodeficiency virus [HIV] disease: Secondary | ICD-10-CM

## 2016-10-05 ENCOUNTER — Other Ambulatory Visit: Payer: Self-pay | Admitting: *Deleted

## 2016-10-05 DIAGNOSIS — E7849 Other hyperlipidemia: Secondary | ICD-10-CM

## 2016-10-05 MED ORDER — PRAVASTATIN SODIUM 20 MG PO TABS: 20.0000 mg | ORAL_TABLET | Freq: Every day | ORAL | 0 refills | Status: DC

## 2016-10-08 ENCOUNTER — Encounter: Payer: Self-pay | Admitting: Family Medicine

## 2016-10-08 ENCOUNTER — Other Ambulatory Visit: Payer: Self-pay | Admitting: Family Medicine

## 2016-10-08 DIAGNOSIS — M545 Low back pain, unspecified: Secondary | ICD-10-CM

## 2016-10-08 MED ORDER — PREGABALIN 75 MG PO CAPS: 75.0000 mg | ORAL_CAPSULE | Freq: Two times a day (BID) | ORAL | 2 refills | Status: DC

## 2016-10-08 MED ORDER — DULOXETINE HCL 30 MG PO CPEP: 60.0000 mg | ORAL_CAPSULE | Freq: Every day | ORAL | 2 refills | Status: DC

## 2016-10-16 DIAGNOSIS — M545 Low back pain: Secondary | ICD-10-CM | POA: Diagnosis not present

## 2016-10-16 DIAGNOSIS — G894 Chronic pain syndrome: Secondary | ICD-10-CM | POA: Diagnosis not present

## 2016-10-16 DIAGNOSIS — M25562 Pain in left knee: Secondary | ICD-10-CM | POA: Diagnosis not present

## 2016-10-16 DIAGNOSIS — M25561 Pain in right knee: Secondary | ICD-10-CM | POA: Diagnosis not present

## 2016-11-09 ENCOUNTER — Encounter: Payer: Self-pay | Admitting: Family Medicine

## 2016-11-10 ENCOUNTER — Other Ambulatory Visit: Payer: Self-pay | Admitting: Family Medicine

## 2016-11-10 ENCOUNTER — Encounter: Payer: Self-pay | Admitting: Family Medicine

## 2016-11-10 DIAGNOSIS — M545 Low back pain, unspecified: Secondary | ICD-10-CM

## 2016-11-10 MED ORDER — DULOXETINE HCL 30 MG PO CPEP: 60.0000 mg | ORAL_CAPSULE | Freq: Every day | ORAL | 2 refills | Status: DC

## 2016-11-10 MED ORDER — PREGABALIN 75 MG PO CAPS: 75.0000 mg | ORAL_CAPSULE | Freq: Two times a day (BID) | ORAL | 2 refills | Status: DC

## 2016-11-16 DIAGNOSIS — M545 Low back pain: Secondary | ICD-10-CM | POA: Diagnosis not present

## 2016-11-16 DIAGNOSIS — G894 Chronic pain syndrome: Secondary | ICD-10-CM | POA: Diagnosis not present

## 2016-12-16 ENCOUNTER — Encounter: Payer: Self-pay | Admitting: Family Medicine

## 2016-12-16 DIAGNOSIS — G894 Chronic pain syndrome: Secondary | ICD-10-CM | POA: Diagnosis not present

## 2016-12-16 DIAGNOSIS — M545 Low back pain: Secondary | ICD-10-CM | POA: Diagnosis not present

## 2016-12-23 ENCOUNTER — Other Ambulatory Visit: Payer: Medicare Other

## 2016-12-24 ENCOUNTER — Ambulatory Visit: Payer: Medicare Other | Admitting: Infectious Disease

## 2016-12-31 ENCOUNTER — Other Ambulatory Visit: Payer: Medicare Other

## 2016-12-31 DIAGNOSIS — Z113 Encounter for screening for infections with a predominantly sexual mode of transmission: Secondary | ICD-10-CM | POA: Diagnosis not present

## 2016-12-31 DIAGNOSIS — M7061 Trochanteric bursitis, right hip: Secondary | ICD-10-CM

## 2016-12-31 DIAGNOSIS — B20 Human immunodeficiency virus [HIV] disease: Secondary | ICD-10-CM | POA: Diagnosis not present

## 2016-12-31 DIAGNOSIS — R634 Abnormal weight loss: Secondary | ICD-10-CM

## 2016-12-31 DIAGNOSIS — M7062 Trochanteric bursitis, left hip: Secondary | ICD-10-CM | POA: Diagnosis not present

## 2016-12-31 DIAGNOSIS — M545 Low back pain, unspecified: Secondary | ICD-10-CM

## 2016-12-31 DIAGNOSIS — J4541 Moderate persistent asthma with (acute) exacerbation: Secondary | ICD-10-CM

## 2016-12-31 DIAGNOSIS — Z79899 Other long term (current) drug therapy: Secondary | ICD-10-CM

## 2017-01-01 LAB — COMPLETE METABOLIC PANEL WITH GFR
AG Ratio: 1.4 (calc) (ref 1.0–2.5)
ALKALINE PHOSPHATASE (APISO): 111 U/L (ref 33–130)
ALT: 14 U/L (ref 6–29)
AST: 17 U/L (ref 10–35)
Albumin: 4.2 g/dL (ref 3.6–5.1)
BILIRUBIN TOTAL: 0.3 mg/dL (ref 0.2–1.2)
BUN/Creatinine Ratio: 14 (calc) (ref 6–22)
BUN: 19 mg/dL (ref 7–25)
CHLORIDE: 106 mmol/L (ref 98–110)
CO2: 27 mmol/L (ref 20–32)
Calcium: 9.5 mg/dL (ref 8.6–10.4)
Creat: 1.36 mg/dL — ABNORMAL HIGH (ref 0.50–1.05)
GFR, Est African American: 51 mL/min/{1.73_m2} — ABNORMAL LOW (ref >=60)
GFR, Est Non African American: 44 mL/min/{1.73_m2} — ABNORMAL LOW (ref >=60)
GLUCOSE: 70 mg/dL (ref 65–99)
Globulin: 3 g/dL (calc) (ref 1.9–3.7)
Potassium: 4.2 mmol/L (ref 3.5–5.3)
Sodium: 142 mmol/L (ref 135–146)
TOTAL PROTEIN: 7.2 g/dL (ref 6.1–8.1)

## 2017-01-01 LAB — LIPID PANEL
Cholesterol: 193 mg/dL (ref <200)
HDL: 51 mg/dL (ref >50)
LDL CHOLESTEROL (CALC): 104 mg/dL — AB
NON-HDL CHOLESTEROL (CALC): 142 mg/dL — AB (ref <130)
TRIGLYCERIDES: 263 mg/dL — AB (ref <150)
Total CHOL/HDL Ratio: 3.8 (calc) (ref <5.0)

## 2017-01-01 LAB — CBC WITH DIFFERENTIAL/PLATELET
Basophils Absolute: 73 cells/uL (ref 0–200)
Basophils Relative: 0.7 %
EOS PCT: 2.1 %
Eosinophils Absolute: 218 cells/uL (ref 15–500)
HCT: 41.7 % (ref 35.0–45.0)
Hemoglobin: 14 g/dL (ref 11.7–15.5)
Lymphs Abs: 4930 cells/uL — ABNORMAL HIGH (ref 850–3900)
MCH: 32 pg (ref 27.0–33.0)
MCHC: 33.6 g/dL (ref 32.0–36.0)
MCV: 95.4 fL (ref 80.0–100.0)
MONOS PCT: 9 %
MPV: 9.9 fL (ref 7.5–12.5)
NEUTROS PCT: 40.8 %
Neutro Abs: 4243 cells/uL (ref 1500–7800)
PLATELETS: 268 10*3/uL (ref 140–400)
RBC: 4.37 10*6/uL (ref 3.80–5.10)
RDW: 13.2 % (ref 11.0–15.0)
TOTAL LYMPHOCYTE: 47.4 %
WBC mixed population: 936 cells/uL (ref 200–950)
WBC: 10.4 10*3/uL (ref 3.8–10.8)

## 2017-01-01 LAB — T-HELPER CELL (CD4) - (RCID CLINIC ONLY)
CD4 T CELL HELPER: 18 % — AB (ref 33–55)
CD4 T Cell Abs: 1000 /uL (ref 400–2700)

## 2017-01-01 LAB — RPR: RPR Ser Ql: NONREACTIVE

## 2017-01-04 LAB — HIV-1 RNA QUANT-NO REFLEX-BLD
HIV 1 RNA Quant: <20 copies/mL
HIV-1 RNA QUANT, LOG: NOT DETECTED {Log_copies}/mL

## 2017-01-18 ENCOUNTER — Ambulatory Visit (INDEPENDENT_AMBULATORY_CARE_PROVIDER_SITE_OTHER): Payer: Medicare HMO | Admitting: Infectious Disease

## 2017-01-18 ENCOUNTER — Encounter: Payer: Self-pay | Admitting: Infectious Disease

## 2017-01-18 VITALS — BP 155/99 | HR 85 | Temp 97.6°F | Ht 64.0 in | Wt 170.0 lb

## 2017-01-18 DIAGNOSIS — J209 Acute bronchitis, unspecified: Secondary | ICD-10-CM

## 2017-01-18 DIAGNOSIS — J440 Chronic obstructive pulmonary disease with acute lower respiratory infection: Secondary | ICD-10-CM

## 2017-01-18 DIAGNOSIS — I10 Essential (primary) hypertension: Secondary | ICD-10-CM

## 2017-01-18 DIAGNOSIS — F17200 Nicotine dependence, unspecified, uncomplicated: Secondary | ICD-10-CM | POA: Diagnosis not present

## 2017-01-18 DIAGNOSIS — Z23 Encounter for immunization: Secondary | ICD-10-CM

## 2017-01-18 DIAGNOSIS — M87059 Idiopathic aseptic necrosis of unspecified femur: Secondary | ICD-10-CM

## 2017-01-18 DIAGNOSIS — M545 Low back pain, unspecified: Secondary | ICD-10-CM

## 2017-01-18 DIAGNOSIS — B20 Human immunodeficiency virus [HIV] disease: Secondary | ICD-10-CM | POA: Diagnosis not present

## 2017-01-18 DIAGNOSIS — E7849 Other hyperlipidemia: Secondary | ICD-10-CM | POA: Diagnosis not present

## 2017-01-18 MED ORDER — PRAVASTATIN SODIUM 20 MG PO TABS: 20.0000 mg | ORAL_TABLET | Freq: Every day | ORAL | 11 refills | Status: DC

## 2017-01-18 MED ORDER — DULOXETINE HCL 30 MG PO CPEP: 60.0000 mg | ORAL_CAPSULE | Freq: Every day | ORAL | 11 refills | Status: DC

## 2017-01-18 MED ORDER — ABACAVIR-DOLUTEGRAVIR-LAMIVUD 600-50-300 MG PO TABS: 1.0000 | ORAL_TABLET | Freq: Every day | ORAL | 11 refills | Status: DC

## 2017-01-18 MED ORDER — AMLODIPINE BESYLATE 10 MG PO TABS: 10.0000 mg | ORAL_TABLET | Freq: Every day | ORAL | 11 refills | Status: DC

## 2017-01-18 MED ORDER — FLUTICASONE-SALMETEROL 250-50 MCG/DOSE IN AEPB: 1.0000 | INHALATION_SPRAY | Freq: Two times a day (BID) | RESPIRATORY_TRACT | 11 refills | Status: DC

## 2017-01-18 NOTE — Progress Notes (Signed)
Subjective:   Chief complaint: [place] for HIV still complaining of some problems with cough  Patient ID: Molly Dillon, female    DOB: 1960/03/24, 57 y.o.   MRN: [id number]  HPI   57 year old with HIV, Hypertension, hypertensive nephropathy, asthma and COPD   She remains with excellent adherence to her antiretroviral regimen.  Lab Results  Component Value Date   HIV1RNAQUANT <20 NOT DETECTED 12/31/2016   HIV1RNAQUANT <20 NOT DETECTED 06/15/2016   HIV1RNAQUANT <20 11/13/2015   Lab Results  Component Value Date   CD4TABS 1,000 12/31/2016   CD4TABS 880 06/15/2016   CD4TABS 620 11/13/2015    Does still suffer from chronic asthma and COPD.  She did successfully quit smoking one point in time but now smoking again though less so.  She is being followed by the family medicine clinic.  I have noticed that she is run out of many of her multiple medications.  I would like to have her medications all filled through the [facility] outpatient pharmacy so we can mail them to her and ensure that she does not have any lapses in any of her medications.  I do not think she is missing any of her HIV meds but clearly her antihypertensives for example have been gone for some time now.     Past Medical History:  Diagnosis Date  . Acute renal insufficiency 06/06/2014  . Asthma   . Constipation 07/19/2014  . COPD (chronic obstructive pulmonary disease) with acute bronchitis ([place]) 06/06/2014  . Depression   . Depression, major, recurrent, moderate ([place]) 04/02/2015  . HIV infection ([place])   . Hyperlipidemia   . Hypertension   . Hypertensive nephropathy 07/19/2014  . Unintentional weight loss 02/13/2015    No past surgical history on file.  No family history on file.    Social History   Socioeconomic History  . Marital status: Married    Spouse name: None  . Number of children: None  . Years of education: None  . Highest education level: None  Social Needs  . Financial resource strain:  None  . Food insecurity - worry: None  . Food insecurity - inability: None  . Transportation needs - medical: None  . Transportation needs - non-medical: None  Occupational History  . None  Tobacco Use  . Smoking status: Current Some Day Smoker    Packs/day: 1.00    Years: 36.00    Pack years: 36.00    Types: Cigarettes    Start date: 01/13/1980    Last attempt to quit: 07/02/2014    Years since quitting: 2.5  . Smokeless tobacco: Never Used  . Tobacco comment: down to 5 cigs a day  Substance and Sexual Activity  . Alcohol use: No    Alcohol/week: 0.0 oz    Frequency: Never  . Drug use: No  . Sexual activity: No    Partners: Male    Birth control/protection: None    Comment: given condoms  Other Topics Concern  . None  Social History Narrative  . None    Allergies  Allergen Reactions  . Dapsone Shortness Of Breath  . Penicillins Hives and Shortness Of Breath     Current Outpatient Medications:  .  albuterol (PROVENTIL HFA;VENTOLIN HFA) 108 (90 Base) MCG/ACT inhaler, Inhale 2 puffs into the lungs every 6 (six) hours as needed for wheezing., Disp: 1 Inhaler, Rfl: 11 .  amLODipine (NORVASC) 10 MG tablet, Take 1 tablet (  10 mg total) by mouth daily., Disp: 30 tablet, Rfl: 5 .  buPROPion (WELLBUTRIN SR) 150 MG 12 hr tablet, Take 1 tablet (150 mg total) by mouth daily., Disp: 30 tablet, Rfl: 1 .  diclofenac (VOLTAREN) 75 MG EC tablet, Take 1 tablet (75 mg total) by mouth 2 (two) times daily between meals as needed., Disp: 60 tablet, Rfl: 3 .  DULoxetine (CYMBALTA) 30 MG capsule, Take 2 capsules (60 mg total) by mouth daily., Disp: 60 capsule, Rfl: 2 .  Fluticasone-Salmeterol (ADVAIR DISKUS) 250-50 MCG/DOSE AEPB, Inhale 1 puff into the lungs 2 (two) times daily., Disp: 60 each, Rfl: 11 .  mirtazapine (REMERON) 45 MG tablet, take 1 tablet by mouth at bedtime, Disp: 30 tablet, Rfl: 5 .  Multiple Vitamin (MULTIVITAMIN) tablet, Take 1 tablet by mouth daily., Disp: , Rfl:  .   pravastatin (PRAVACHOL) 20 MG tablet, Take 1 tablet (20 mg total) by mouth daily. Reported on 04/02/2015, Disp: 90 tablet, Rfl: 0 .  pregabalin (LYRICA) 75 MG capsule, Take 1 capsule (75 mg total) by mouth 2 (two) times daily., Disp: 60 capsule, Rfl: 2 .  triamcinolone ointment (KENALOG) 0.5 %, Apply 1 application topically 2 (two) times daily., Disp: 30 g, Rfl: 3 .  TRIUMEQ 600-50-300 MG tablet, take 1 tablet by mouth once daily, Disp: 30 tablet, Rfl: 5 .  nicotine (NICODERM CQ - DOSED IN MG/24 HOURS) 21 mg/24hr patch, Place 1 patch (21 mg total) onto the skin daily. May substitute formulary preferred (Patient not taking: Reported on 01/18/2017), Disp: 28 patch, Rfl: 2 .  nicotine polacrilex (NICORETTE) 4 MG gum, Take 1 each (4 mg total) by mouth as needed for smoking cessation. (Patient not taking: Reported on 01/18/2017), Disp: 100 tablet, Rfl: 1   Review of Systems  Constitutional: Negative for activity change, chills, diaphoresis and fever.  HENT: Positive for postnasal drip. Negative for congestion, rhinorrhea, sinus pressure, sneezing, sore throat and trouble swallowing.   Eyes: Negative for photophobia and visual disturbance.  Respiratory: Positive for cough and shortness of breath. Negative for chest tightness, wheezing and stridor.   Cardiovascular: Negative for chest pain, palpitations and leg swelling.  Gastrointestinal: Negative for abdominal distention, abdominal pain, anal bleeding, blood in stool, diarrhea, nausea and vomiting.  Genitourinary: Negative for difficulty urinating, dysuria, flank pain and hematuria.  Musculoskeletal: Positive for back pain. Negative for arthralgias, gait problem and joint swelling.  Skin: Negative for color change, pallor and wound.  Neurological: Negative for dizziness, tremors, weakness and light-headedness.  Hematological: Negative for adenopathy. Does not bruise/bleed easily.  Psychiatric/Behavioral: Negative for agitation, behavioral problems,  confusion, dysphoric mood and sleep disturbance.       Objective:   Physical Exam  Constitutional: She is oriented to person, place, and time. She appears well-developed and well-nourished. No distress.  HENT:  Head: Normocephalic and atraumatic.  Mouth/Throat: No oropharyngeal exudate.  Eyes: Conjunctivae and EOM are normal. No scleral icterus.  Neck: Normal range of motion. Neck supple.  Cardiovascular: Normal rate, regular rhythm and normal heart sounds. Exam reveals no gallop and no friction rub.  No murmur heard. Pulmonary/Chest: Effort normal. No respiratory distress. She has no wheezes. She has rales.  Abdominal: Soft. She exhibits no distension. There is no tenderness.  Musculoskeletal: She exhibits no edema or tenderness.       Right hip: She exhibits normal range of motion.       Left hip: She exhibits normal range of motion.     Neurological: She is alert  and oriented to person, place, and time. She exhibits normal muscle tone. Coordination normal.  Skin: Skin is warm and dry. She is not diaphoretic. No erythema. No pallor.  Psychiatric: She has a normal mood and affect. Her behavior is normal. Judgment and thought content normal.         Assessment & Plan:   COPD:with asthma, continue inhaled corticosteroids and prn broncholdilators.,  He has primary care physician with family medicine.  I sent her inhalers to [facility] study  HIV:  Continue [place] and will check labs regularly and send medications to [facility] pharmacy so that they can be mailed to her house.  I had Inez Catalina from pharmacy meet with her today.   Hypertensive nephropathy and now CKD: Renew amlodipine,  Vitals:   01/18/17 1554  BP: (!) 155/99  Pulse: 85  Temp: 97.6 F (36.4 C)   Lab Results  Component Value Date   CREATININE 1.36 (H) 12/31/2016   CREATININE 1.55 (H) 06/15/2016   CREATININE 1.43 (H) 11/13/2015    Smoking: Think she understands the significance and importance of stopping the  smoking but is clearly not an easy task.  I spent greater than 25 minutes with the patient including greater than 50% of time in face to face counsel of the patient with regards to her laboratory data going back 14 years which I reviewed with her commending her on her excellent adherence to antiretroviral medications, going through data re U = U and fact that she cannot transmit the virus to any sexual partners as long as she maintains VL <20  explaining how we need to also keep track of her blood pressure and COPD are better and in coordination of her care.

## 2017-01-19 MED FILL — ADVAIR 250/50 DISKUS: 250-50 | 60 days supply | Qty: 60 | Fill #0

## 2017-01-19 MED FILL — AMLODIPINE BESYLATE 10 MG T: 10 | 30 days supply | Qty: 30 | Fill #0

## 2017-01-19 MED FILL — PRAVASTATIN NA 20 MG TAB: 20 | 30 days supply | Qty: 30 | Fill #0

## 2017-01-19 MED FILL — TRIUMEQ 600-50-300 MG TABS: 600-50-300 | 30 days supply | Qty: 30 | Fill #0

## 2017-01-20 DIAGNOSIS — M545 Low back pain: Secondary | ICD-10-CM | POA: Diagnosis not present

## 2017-01-20 DIAGNOSIS — G894 Chronic pain syndrome: Secondary | ICD-10-CM | POA: Diagnosis not present

## 2017-01-20 DIAGNOSIS — Z79891 Long term (current) use of opiate analgesic: Secondary | ICD-10-CM | POA: Diagnosis not present

## 2017-01-21 ENCOUNTER — Other Ambulatory Visit: Payer: Self-pay | Admitting: Pharmacist

## 2017-02-03 MED FILL — DULoxetine HCL 30 MG CPEP: 30 | 30 days supply | Qty: 60 | Fill #0

## 2017-02-03 MED FILL — LYRICA 75 MG CAPSULE: 75 | 30 days supply | Qty: 60 | Fill #0

## 2017-02-19 MED FILL — TRIUMEQ 600-50-300 MG TABS: 600-50-300 | 30 days supply | Qty: 30 | Fill #1

## 2017-02-22 DIAGNOSIS — N183 Chronic kidney disease, stage 3 unspecified: Secondary | ICD-10-CM | POA: Insufficient documentation

## 2017-02-22 NOTE — Progress Notes (Signed)
Subjective   Patient ID: Molly Dillon    DOB: 09-27-1960, 57 y.o. female   MRN: [id number]  CC: "Cough"  HPI: Molly Dillon is a 57 y.o. female who presents to clinic today for the following:  Cough: Patient with 1 month worth of cough with initial purulent production with some improvement over the last couple of weeks.  She also endorsed history of rhinorrhea, watery eye discharge, and sore throat initially which is now resolved.  Patient has been using her chronic Advair regularly and has been increasing her albuterol use 2 puffs every 2 hours for wheezing.  She denies fevers or chills, shortness of breath, chest pain, hemoptysis.  She has been using Robitussin without relief of cough.  She denies any sick contacts.  Her last cigarette was 6 months ago.  She is doing well with the nicotine replacement therapy.  Chronic pain: Patient states Lyrica and Cymbalta has shoulder symptoms well she is able to continue her day without limitations while on the medication.  [facility] due to difficulty with refills.  She is without pain today.  ROS: see HPI for pertinent.  [place]: HIV (controlled), HTN with nephropathy, CKDIII, HLD, COPD/asthma, depression.  Surgical history eye.  Family history unremarkable.  Smoking status reviewed.  Smoking status reviewed.  Medications reviewed.  Objective   BP 122/80   Pulse (!) 105   Temp 97.8 F (36.6 C) (Oral)   Ht 5\' 4"  (1.626 m)   Wt 169 lb 9.6 oz (76.9 kg)   LMP 01/12/2011   SpO2 95%   BMI 29.11 kg/m  Vitals and nursing note reviewed.  General: sitting upright in chair without increased respiratory effort, NAD with non-toxic appearance HEENT: normocephalic, atraumatic, moist mucous membranes, TM gray bilaterally, no tonsillar edema or purulance, nares dry bilaterally Neck: supple, non-tender without lymphadenopathy Cardiovascular: regular rate and rhythm without murmurs, rubs, or  gallops Lungs: upper airway secretions but clear to auscultation bilaterally with normal work of breathing on room air Skin: warm, dry, no rashes or lesions, cap refill < 2 seconds Extremities: warm and well perfused, normal tone, no edema  Assessment & Plan   Viral URI with cough Acute.  History consistent with viral URI.  Does have underlying COPD/asthma with known severity given no PFT per chart review.  Patient without signs of pneumonia or exacerbation. - Reviewed return precautions - Tessalon Perles 100 mg twice daily as needed for cough, supplement with honey as needed - Continue Advair 2 puffs twice daily and albuterol 2 puffs every 4 hours as needed - Patient to follow-up for PFT assessment after resolution of symptoms  Chronic pain disorder Chronic.  Controlled with use of Lyrica and Cymbalta. - Continue Cymbalta 60 mg daily and Lyrica 75 mg daily, given 72-month supply  No orders of the defined types were placed in this encounter.  Meds ordered this encounter  Medications  . DISCONTD: benzonatate (TESSALON PERLES) 100 MG capsule    Sig: Take 1 capsule (100 mg total) by mouth 2 (two) times daily as needed for cough.    Dispense:  20 capsule    Refill:  0  . benzonatate (TESSALON PERLES) 100 MG capsule    Sig: Take 1 capsule (100 mg total) by mouth 2 (two) times daily as needed for cough.    Dispense:  20 capsule    Refill:  0  . DULoxetine (CYMBALTA) 30 MG capsule    Sig: Take 2  capsules (60 mg total) by mouth daily.    Dispense:  60 capsule    Refill:  2  . pregabalin (LYRICA) 75 MG capsule    Sig: Take 1 capsule (75 mg total) by mouth 2 (two) times daily.    Dispense:  60 capsule    Refill:  2    Harriet Butte, [place], PGY-2 02/23/2017, 12:11 PM

## 2017-02-23 ENCOUNTER — Ambulatory Visit (INDEPENDENT_AMBULATORY_CARE_PROVIDER_SITE_OTHER): Payer: Medicare HMO | Admitting: Family Medicine

## 2017-02-23 ENCOUNTER — Encounter: Payer: Self-pay | Admitting: Family Medicine

## 2017-02-23 ENCOUNTER — Other Ambulatory Visit: Payer: Self-pay

## 2017-02-23 VITALS — BP 122/80 | HR 105 | Temp 97.8°F | Ht 64.0 in | Wt 169.6 lb

## 2017-02-23 DIAGNOSIS — M545 Low back pain, unspecified: Secondary | ICD-10-CM

## 2017-02-23 DIAGNOSIS — B9789 Other viral agents as the cause of diseases classified elsewhere: Secondary | ICD-10-CM

## 2017-02-23 DIAGNOSIS — G894 Chronic pain syndrome: Secondary | ICD-10-CM | POA: Diagnosis not present

## 2017-02-23 DIAGNOSIS — J069 Acute upper respiratory infection, unspecified: Secondary | ICD-10-CM

## 2017-02-23 MED ORDER — BENZONATATE 100 MG PO CAPS: 100.0000 mg | ORAL_CAPSULE | Freq: Two times a day (BID) | ORAL | 0 refills | Status: DC | PRN

## 2017-02-23 MED ORDER — PREGABALIN 75 MG PO CAPS: 75.0000 mg | ORAL_CAPSULE | Freq: Two times a day (BID) | ORAL | 2 refills | Status: DC

## 2017-02-23 MED ORDER — DULOXETINE HCL 30 MG PO CPEP: 60.0000 mg | ORAL_CAPSULE | Freq: Every day | ORAL | 2 refills | Status: DC

## 2017-02-23 MED FILL — BENZONATATE 100 MG CAP: 100 | 10 days supply | Qty: 20 | Fill #0

## 2017-02-23 MED FILL — AMLODIPINE BESYLATE 10 MG T: 10 | 30 days supply | Qty: 30 | Fill #1

## 2017-02-23 MED FILL — PRAVASTATIN SODIUM 20 MG TA: 20 | 30 days supply | Qty: 30 | Fill #1

## 2017-02-23 NOTE — Patient Instructions (Signed)
Thank you for coming in to see [place] today. Please see below to review our plan for today's visit.  1.  Your symptoms are likely from a viral illness.  This should pass without need for antibiotics.  I do not hear any wheezing on exam and you do not have signs of pneumonia.  I have given you a prescription for Tessalon Perles which you can take twice daily as needed for cough.  You can supplement with a tablespoon of honey 3 times daily.  Coughing is usually the last symptom to resolve and can last weeks to months.  Please return to the clinic in 1 month if your symptoms continue or if they worsen, come in sooner.  I am proud that you are able to stop smoking!  Keep up the good work! 2.  We will arrange for you to follow-up for pulmonary function testing here at the clinic.  Please call the clinic at [phone] if your symptoms worsen or you have any concerns. It was our pleasure to serve you.  Harriet Butte, [place], PGY-2

## 2017-02-23 NOTE — Assessment & Plan Note (Addendum)
Chronic.  Controlled with use of Lyrica and Cymbalta. - Continue Cymbalta 60 mg daily and Lyrica 75 mg daily, given 94-month supply

## 2017-02-23 NOTE — Assessment & Plan Note (Addendum)
Acute.  History consistent with viral URI.  Does have underlying COPD/asthma with known severity given no PFT per chart review.  Patient without signs of pneumonia or exacerbation. - Reviewed return precautions - Tessalon Perles 100 mg twice daily as needed for cough, supplement with honey as needed - Continue Advair 2 puffs twice daily and albuterol 2 puffs every 4 hours as needed - Patient to follow-up for PFT assessment after resolution of symptoms

## 2017-03-10 MED FILL — DULoxetine HCL 30 MG CPEP: 30 | 30 days supply | Qty: 60 | Fill #0

## 2017-03-10 MED FILL — LYRICA 75 MG CAPSULE: 75 | 30 days supply | Qty: 60 | Fill #0

## 2017-03-23 ENCOUNTER — Encounter: Payer: Self-pay | Admitting: Family Medicine

## 2017-03-23 ENCOUNTER — Other Ambulatory Visit: Payer: Self-pay

## 2017-03-23 ENCOUNTER — Ambulatory Visit (INDEPENDENT_AMBULATORY_CARE_PROVIDER_SITE_OTHER): Payer: Medicare HMO | Admitting: Family Medicine

## 2017-03-23 VITALS — BP 132/80 | HR 87 | Temp 98.2°F | Ht 64.0 in | Wt 169.2 lb

## 2017-03-23 DIAGNOSIS — B9789 Other viral agents as the cause of diseases classified elsewhere: Secondary | ICD-10-CM | POA: Diagnosis not present

## 2017-03-23 DIAGNOSIS — J069 Acute upper respiratory infection, unspecified: Secondary | ICD-10-CM | POA: Diagnosis not present

## 2017-03-23 DIAGNOSIS — Z122 Encounter for screening for malignant neoplasm of respiratory organs: Secondary | ICD-10-CM

## 2017-03-23 DIAGNOSIS — J449 Chronic obstructive pulmonary disease, unspecified: Secondary | ICD-10-CM

## 2017-03-23 DIAGNOSIS — Z87891 Personal history of nicotine dependence: Secondary | ICD-10-CM

## 2017-03-23 NOTE — Progress Notes (Signed)
Subjective   Patient ID: Molly Dillon    DOB: 1960-12-28, 57 y.o. female   MRN: [id number]  CC: "Pulmonary function testing"  HPI: Molly Dillon is a 57 y.o. female who presents to clinic today for the following:  COPD and asthma: Patient has long-standing COPD with asthma and known moderate emphysema based on CT of chest last January.  She continues to take her controller medicine Advair Diskus twice daily but has used her albuterol rescue inhaler 3 times daily for "wheezing."  She states she has "shortness of breath all the time" but this is not changed in the last several years.  He states she can walk approximately 50 feet before coming short of breath.  She also states she cannot keep up with other women her age with activity level.  Patient denies hemoptysis or productive cough, chest pain, change in weight, fevers or chills.  History of smoking: Patient is a former smoker and quit 7 months ago.  She has a history of smoking 2 packs/day since age 50.  She does not have any history of receiving low contrast CT.  She is interested in receiving her annual lung screen test today.  Viral URI: Patient was seen 1 month ago for viral URI symptoms including a nonproductive cough.  Patient states her cough has significantly improved and she is back at her baseline today.  She denies any rhinorrhea, eye discharge, ear pain, sore throat, headache, productive cough.  ROS: see HPI for pertinent.  [place]: HIV (controlled), HTN with nephropathy, CKDIII, HLD, COPD/asthma, depression.  Surgical history eye.  Family history unremarkable. Smoking status reviewed. Medications reviewed.  Objective   BP 132/80   Pulse 87   Temp 98.2 F (36.8 C) (Oral)   Ht 5\' 4"  (1.626 m)   Wt 169 lb 3.2 oz (76.7 kg)   LMP 01/12/2011   SpO2 92%   BMI 29.04 kg/m  Vitals and nursing note reviewed.  General: well nourished, well developed, NAD with non-toxic appearance HEENT: normocephalic, atraumatic, moist mucous  membranes Neck: supple, non-tender without lymphadenopathy Cardiovascular: regular rate and rhythm without murmurs, rubs, or gallops Lungs: mildly diminished breath sounds bilaterally throughout without audible wheeze, there is some bibasilar rales, normal work of breathing on room air Skin: warm, dry, no rashes or lesions, cap refill < 2 seconds Extremities: warm and well perfused, normal tone, no edema  Assessment & Plan   COPD with asthma (HCC) Chronic.  Questionable control.  Patient endorsing frequent use of albuterol 3 times daily without nocturnal use for wheezing only.  Patient does have chronic shortness of breath and meets gold criteria D.  Needs updated PFT.  Currently non-smoking.  I suspect her albuterol use may be excessive and she is using it inappropriately given that wheeze do not improve albuterol use. - Encourage patient to use rescue inhaler only when shortness of breath changes from her baseline - Continue Advair Diskus 2 puffs daily - Schedule PFT on 3/14 at 8:30 AM with Dr. Valentina Lucks - Scheduled low contrast CT of chest for annual lung screen on 3/20 at 3:15 PM - RTC 1 month  History of tobacco use disorder Chronic.  Not smoking currently.  Patient does have later on her but states she uses it smoke herself. - Congratulated patient on continued smoking cessation  Viral URI with cough Acute.  Now resolved.  No signs or symptoms of bacterial pneumonia or sinusitis. - Reviewed return precautions  Orders Placed This Encounter  Procedures  . CT  CHEST LUNG CA SCREEN LOW DOSE W/O CM    Standing Status:   Future    Standing Expiration Date:   05/24/2018    Order Specific Question:   Reason for Exam (SYMPTOM  OR DIAGNOSIS REQUIRED)    Answer:   Lung cancer screen    Order Specific Question:   Preferred Imaging Location?    Answer:   [facility]    Order Specific Question:   Is patient pregnant?    Answer:   No    Order Specific Question:   Radiology Contrast  Protocol - do NOT remove file path    Answer:   \\[email or web address]   No orders of the defined types were placed in this encounter.   Harriet Butte, [place], PGY-2 03/23/2017, 2:23 PM

## 2017-03-23 NOTE — Assessment & Plan Note (Addendum)
Acute.  Now resolved.  No signs or symptoms of bacterial pneumonia or sinusitis. - Reviewed return precautions

## 2017-03-23 NOTE — Assessment & Plan Note (Addendum)
Chronic.  Not smoking currently.  Patient does have later on her but states she uses it smoke herself. - Congratulated patient on continued smoking cessation

## 2017-03-23 NOTE — Patient Instructions (Addendum)
Thank you for coming in to see [place] today. Please see below to review our plan for today's visit.  1.  Congratulations on not smoking for 7 months!  This is very important for your overall health.  That being said, you do qualify for a low contrast CT to screen for lung cancer.  This will be done annually starting at age 57.  We will arrange for you to get this done at [facility].  I will call you when I receive the results. 2.  I would like you to stop using your albuterol as often as you have been.  You do have long-standing emphysema which is likely why you get short of breath.  I do not appreciate any wheezing during your exam.  Continue taking her Advair Diskus and use the albuterol if you have a change from your baseline with breathing. 3.  I would like you to follow-up with Dr. Valentina Lucks to receive an updated pulmonary function testing on 03/25/17 at 8:30 AM.  Do not take your Advair Diskus 48 hours prior to your testing.  Please call the clinic at [phone] if your symptoms worsen or you have any concerns. It was our pleasure to serve you.  Harriet Butte, [place], PGY-2

## 2017-03-23 NOTE — Assessment & Plan Note (Addendum)
Chronic.  Questionable control.  Patient endorsing frequent use of albuterol 3 times daily without nocturnal use for wheezing only.  Patient does have chronic shortness of breath and meets gold criteria D.  Needs updated PFT.  Currently non-smoking.  I suspect her albuterol use may be excessive and she is using it inappropriately given that wheeze do not improve albuterol use. - Encourage patient to use rescue inhaler only when shortness of breath changes from her baseline - Continue Advair Diskus 2 puffs daily - Schedule PFT on 3/14 at 8:30 AM with Dr. Valentina Lucks - Scheduled low contrast CT of chest for annual lung screen on 3/20 at 3:15 PM - RTC 1 month

## 2017-03-24 MED FILL — TRIUMEQ 600-50-300 MG TABS: 600-50-300 | 30 days supply | Qty: 30 | Fill #2

## 2017-03-24 MED FILL — PRAVASTATIN SODIUM 20 MG TA: 20 | 30 days supply | Qty: 30 | Fill #2

## 2017-03-24 MED FILL — AMLODIPINE BESYLATE 10 MG T: 10 | 30 days supply | Qty: 30 | Fill #2

## 2017-03-25 ENCOUNTER — Ambulatory Visit: Payer: Medicare HMO | Admitting: Pharmacist

## 2017-03-31 ENCOUNTER — Ambulatory Visit (HOSPITAL_COMMUNITY)
Admission: RE | Admit: 2017-03-31 | Discharge: 2017-03-31 | Disposition: A | Discharge status: Home / Self Care | Payer: Medicare HMO | Source: Ambulatory Visit | Attending: Family Medicine | Admitting: Family Medicine

## 2017-03-31 DIAGNOSIS — Z122 Encounter for screening for malignant neoplasm of respiratory organs: Secondary | ICD-10-CM | POA: Diagnosis not present

## 2017-03-31 DIAGNOSIS — J439 Emphysema, unspecified: Secondary | ICD-10-CM | POA: Diagnosis not present

## 2017-03-31 DIAGNOSIS — I700 Atherosclerosis of aorta: Secondary | ICD-10-CM | POA: Diagnosis not present

## 2017-04-02 ENCOUNTER — Encounter: Payer: Self-pay | Admitting: Family Medicine

## 2017-04-07 ENCOUNTER — Ambulatory Visit (INDEPENDENT_AMBULATORY_CARE_PROVIDER_SITE_OTHER): Payer: Medicare HMO | Admitting: Podiatry

## 2017-04-07 ENCOUNTER — Ambulatory Visit (INDEPENDENT_AMBULATORY_CARE_PROVIDER_SITE_OTHER): Payer: Medicare HMO

## 2017-04-07 ENCOUNTER — Encounter: Payer: Self-pay | Admitting: Podiatry

## 2017-04-07 DIAGNOSIS — Q828 Other specified congenital malformations of skin: Secondary | ICD-10-CM | POA: Diagnosis not present

## 2017-04-07 DIAGNOSIS — M722 Plantar fascial fibromatosis: Secondary | ICD-10-CM | POA: Diagnosis not present

## 2017-04-07 DIAGNOSIS — M21619 Bunion of unspecified foot: Secondary | ICD-10-CM

## 2017-04-07 DIAGNOSIS — Q6689 Other  specified congenital deformities of feet: Secondary | ICD-10-CM

## 2017-04-08 ENCOUNTER — Ambulatory Visit: Payer: Medicare HMO | Admitting: Pharmacist

## 2017-04-13 MED FILL — LYRICA 75 MG CAPSULE: 75 | 30 days supply | Qty: 60 | Fill #1

## 2017-04-13 MED FILL — DULoxetine HCL 30 MG CPEP: 30 | 30 days supply | Qty: 60 | Fill #1

## 2017-04-13 NOTE — Progress Notes (Signed)
   Subjective: 57 year old female presenting today as a new patient with a chief complaint of cramping in bilateral feet secondary to hammertoes that have been present for the past several years. She also reports painful callus lesions to the plantar forefoot bilaterally. She has not done anything for treatment. Walking and bearing weight increases the pain. Patient is here for further evaluation and treatment.   Past Medical History:  Diagnosis Date  . Acute renal insufficiency 06/06/2014  . Asthma   . Constipation 07/19/2014  . COPD (chronic obstructive pulmonary disease) with acute bronchitis ([place]) 06/06/2014  . Depression   . Depression, major, recurrent, moderate ([place]) 04/02/2015  . HIV infection ([place])   . Hyperlipidemia   . Hypertension   . Hypertensive nephropathy 07/19/2014  . Unintentional weight loss 02/13/2015     Objective: Physical Exam General: The patient is alert and oriented x3 in no acute distress.  Dermatology: Hyperkeratotic lesions present on the feet bilaterally x 5. Pain on palpation with a central nucleated core noted. Skin is cool, dry and supple bilateral lower extremities. Negative for open lesions or macerations.  Vascular: Palpable pedal pulses bilaterally. No edema or erythema noted. Capillary refill within normal limits.  Neurological: Epicritic and protective threshold grossly intact bilaterally.   Musculoskeletal Exam: Clinical evidence of bunion deformity noted to the respective foot. There is a moderate pain on palpation range of motion of the first MPJ. Lateral deviation of the hallux noted consistent with hallux abductovalgus. Hammertoe contracture also noted on clinical exam to digits 2-5 of the bilateral feet. Symptomatic pain on palpation and range of motion also noted to the metatarsal phalangeal joints of the respective hammertoe digits.    Radiographic Exam: Increased intermetatarsal angle greater than 15 with a hallux abductus angle greater than  30 noted on AP view. Moderate degenerative changes noted within the first MPJ. Contracture deformity also noted to the interphalangeal joints and MPJs of the digits of the respective hammertoes.   Assessment: 1. HAV w/ bunion deformity bilateral 2. Hammertoe deformity 2-5 bilateral 3. Porokeratosis bilateral x 5    Plan of Care:  1. Patient was evaluated. X-Rays reviewed. 2. Excisional debridement of keratoic lesion using a chisel blade was performed without incident.  3. Treated area(s) with Salinocaine and dressed with light dressing. 4. Recommended good shoe gear. Continue conservative treatment at the moment.  5. Return to clinic in 4 weeks.    Edrick Kins, DPM [facility]  Dr. Edrick Kins, [place]                                        [place], [place] [address]                Office [phone]  Fax [phone]

## 2017-04-16 MED FILL — PRAVASTATIN SODIUM 20 MG TA: 20 | 30 days supply | Qty: 30 | Fill #3

## 2017-04-16 MED FILL — AMLODIPINE BESYLATE 10 MG T: 10 | 30 days supply | Qty: 30 | Fill #3

## 2017-04-16 MED FILL — TRIUMEQ 600-50-300 MG TABS: 600-50-300 | 30 days supply | Qty: 30 | Fill #3

## 2017-05-03 ENCOUNTER — Telehealth: Payer: Self-pay | Admitting: Family Medicine

## 2017-05-03 NOTE — Progress Notes (Deleted)
   Subjective   Patient ID: Molly Dillon    DOB: Aug 10, 1960, 57 y.o. female   MRN: [id number]  CC: "***"  HPI: Molly Dillon is a 57 y.o. female who presents to clinic today for the following:  ***: ***  ***Last seen by me on 03/23/2017 for COPD with asthma.  Was endorsing frequent nocturnal use of albuterol.  Patient needs updated PFT.  Instructed to continue Advair Diskus 2 puffs daily and scheduled to follow-up with Dr. Valentina Lucks for PFT on 3/14.  Also scheduled low contrast CT.  ROS: see HPI for pertinent.  [place]: HIV (controlled), HTN with nephropathy, CKDIII, HLD, COPD/asthma, depression.  Surgical history eye.  Family history unremarkable. Smoking status reviewed. Medications reviewed.  Objective   LMP 01/12/2011  Vitals and nursing note reviewed.  General: well nourished, well developed, NAD with non-toxic appearance HEENT: normocephalic, atraumatic, moist mucous membranes Neck: supple, non-tender without lymphadenopathy Cardiovascular: regular rate and rhythm without murmurs, rubs, or gallops Lungs: clear to auscultation bilaterally with normal work of breathing Abdomen: soft, non-tender, non-distended, normoactive bowel sounds Skin: warm, dry, no rashes or lesions, cap refill < 2 seconds Extremities: warm and well perfused, normal tone, no edema  Assessment & Plan   No problem-specific Assessment & Plan notes found for this encounter.  No orders of the defined types were placed in this encounter.  No orders of the defined types were placed in this encounter.   Harriet Butte, [place], PGY-2 05/03/2017, 10:15 AM

## 2017-05-03 NOTE — Telephone Encounter (Signed)
Contacted patient regarding low contrast CT ordered on 03/31/2017.  Per chart review, exam had ended though no imaging identified.  Contacted Zacarias Pontes, CT department who are unsure why images are not present.  Discussed with patient regarding this who would states she did not get a call regarding the results.  Patient cannot recall having any issues completing the exam.  Reassured patient that CT department is looking into this and will get back to me.  Patient expected to follow with me tomorrow on 05/04/2017.  Patient without further concerns or questions.  Harriet Butte, [place], PGY-2

## 2017-05-04 ENCOUNTER — Ambulatory Visit: Payer: Medicare HMO | Admitting: Family Medicine

## 2017-05-04 ENCOUNTER — Telehealth: Payer: Self-pay | Admitting: Family Medicine

## 2017-05-04 NOTE — Telephone Encounter (Signed)
Pt wanted to let Dr Yisroel Ramming know that she is sorry she did not make her appointment today, she was having images done at another appointment and they were not done in time to make it here. She said she will call back to reschedule.

## 2017-05-04 NOTE — Telephone Encounter (Signed)
Not a problem.  Please schedule her at my next available appointment.  Thanks.

## 2017-05-05 ENCOUNTER — Encounter: Payer: Self-pay | Admitting: Podiatry

## 2017-05-05 ENCOUNTER — Ambulatory Visit (INDEPENDENT_AMBULATORY_CARE_PROVIDER_SITE_OTHER): Payer: Medicare HMO | Admitting: Podiatry

## 2017-05-05 DIAGNOSIS — Q6689 Other  specified congenital deformities of feet: Secondary | ICD-10-CM

## 2017-05-05 DIAGNOSIS — M21612 Bunion of left foot: Secondary | ICD-10-CM | POA: Diagnosis not present

## 2017-05-05 DIAGNOSIS — M21619 Bunion of unspecified foot: Secondary | ICD-10-CM

## 2017-05-05 NOTE — Patient Instructions (Signed)
Pre-Operative Instructions  Congratulations, you have decided to take an important step towards improving your quality of life.  You can be assured that the doctors and staff at [facility] will be with you every step of the way.  Here are some important things you should know:  1. Plan to be at the surgery center/hospital at least 1 (one) hour prior to your scheduled time, unless otherwise directed by the surgical center/hospital staff.  You must have a responsible adult accompany you, remain during the surgery and drive you home.  Make sure you have directions to the surgical center/hospital to ensure you arrive on time. 2. If you are having surgery at [facility] or [facility], you will need a copy of your medical history and physical form from your family physician within one month prior to the date of surgery. We will give you a form for your primary physician to complete.  3. We make every effort to accommodate the date you request for surgery.  However, there are times where surgery dates or times have to be moved.  We will contact you as soon as possible if a change in schedule is required.   4. No aspirin/ibuprofen for one week before surgery.  If you are on aspirin, any non-steroidal anti-inflammatory medications (Mobic, Aleve, Ibuprofen) should not be taken seven (7) days prior to your surgery.  You make take Tylenol for pain prior to surgery.  5. Medications - If you are taking daily heart and blood pressure medications, seizure, reflux, allergy, asthma, anxiety, pain or diabetes medications, make sure you notify the surgery center/hospital before the day of surgery so they can tell you which medications you should take or avoid the day of surgery. 6. No food or drink after midnight the night before surgery unless directed otherwise by surgical center/hospital staff. 7. No alcoholic beverages 24-hours prior to surgery.  No smoking 24-hours prior or 24-hours after  surgery. 8. Wear loose pants or shorts. They should be loose enough to fit over bandages, boots, and casts. 9. Don't wear slip-on shoes. Sneakers are preferred. 10. Bring your boot with you to the surgery center/hospital.  Also bring crutches or a walker if your physician has prescribed it for you.  If you do not have this equipment, it will be provided for you after surgery. 11. If you have not been contacted by the surgery center/hospital by the day before your surgery, call to confirm the date and time of your surgery. 12. Leave-time from work may vary depending on the type of surgery you have.  Appropriate arrangements should be made prior to surgery with your employer. 13. Prescriptions will be provided immediately following surgery by your doctor.  Fill these as soon as possible after surgery and take the medication as directed. Pain medications will not be refilled on weekends and must be approved by the doctor. 14. Remove nail polish on the operative foot and avoid getting pedicures prior to surgery. 15. Wash the night before surgery.  The night before surgery wash the foot and leg well with water and the antibacterial soap provided. Be sure to pay special attention to beneath the toenails and in between the toes.  Wash for at least three (3) minutes. Rinse thoroughly with water and dry well with a towel.  Perform this wash unless told not to do so by your physician.  Enclosed: 1 Ice pack (please put in freezer the night before surgery)   1 Hibiclens skin cleaner     Pre-op instructions  If you have any questions regarding the instructions, please do not hesitate to call our office.  [place]: [address], [place], [place] [address] -- [phone]  [place]: [address] [place], [place] [address] -- [phone]  [place]: [address]  [place], [place] [address] -- [phone]  [place]: [address], [address], [place], [place] [address] -- [phone]  Website:  [email or web address] 

## 2017-05-05 NOTE — Telephone Encounter (Signed)
Pt rescheduled for an appt. Rhydian Baldi Kennon Holter, CMA

## 2017-05-06 ENCOUNTER — Telehealth: Payer: Self-pay | Admitting: *Deleted

## 2017-05-06 ENCOUNTER — Encounter: Payer: Self-pay | Admitting: Podiatry

## 2017-05-06 NOTE — Telephone Encounter (Signed)
Pt requested fungal nail treatment and states Dr. Amalia Hailey said he would prescribe.

## 2017-05-09 NOTE — Progress Notes (Signed)
   Subjective: 57 year old female presenting today for follow up evaluation of hammertoes and bunions bilaterally as well as porokeratosis of bilateral feet. She reports continued cramping in the feet. She states the calluses feel better and is requesting no treatment for them at this time. Patient is here for further evaluation and treatment.   Past Medical History:  Diagnosis Date  . Acute renal insufficiency 06/06/2014  . Asthma   . Constipation 07/19/2014  . COPD (chronic obstructive pulmonary disease) with acute bronchitis ([place]) 06/06/2014  . Depression   . Depression, major, recurrent, moderate ([place]) 04/02/2015  . HIV infection ([place])   . Hyperlipidemia   . Hypertension   . Hypertensive nephropathy 07/19/2014  . Unintentional weight loss 02/13/2015     Objective: Physical Exam General: The patient is alert and oriented x3 in no acute distress.  Dermatology: Skin is cool, dry and supple bilateral lower extremities. Negative for open lesions or macerations.  Vascular: Palpable pedal pulses bilaterally. No edema or erythema noted. Capillary refill within normal limits.  Neurological: Epicritic and protective threshold grossly intact bilaterally.   Musculoskeletal Exam: Clinical evidence of bunion deformity noted to the respective foot. There is a moderate pain on palpation range of motion of the first MPJ. Lateral deviation of the hallux noted consistent with hallux abductovalgus. Hammertoe contracture also noted on clinical exam to digits 2-5 of the left foot. Symptomatic pain on palpation and range of motion also noted to the metatarsal phalangeal joints of the respective hammertoe digits.     Assessment: 1. HAV w/ bunion deformity left 2. Hammertoe deformity 2-5 left    Plan of Care:  1. Patient was evaluated. 2.Today we discussed the conservative versus surgical management of the presenting pathology. The patient opts for surgical management. All possible complications and  details of the procedure were explained. All patient questions were answered. No guarantees were expressed or implied. 3. Authorization for surgery was initiated today. Surgery will consist of bunionectomy left and PIPJ arthroplasty with MPJ capsulotomy 2, 3 and 4 left. 4. CAM boot dispensed.  5. Patient will require medical clearance from PCP prior to surgery.  6. Return to clinic one week post op.     Edrick Kins, DPM [facility]  Dr. Edrick Kins, [place]                                        [place], [place] [address]                Office [phone]  Fax [phone]

## 2017-05-10 ENCOUNTER — Encounter: Payer: Self-pay | Admitting: Family Medicine

## 2017-05-10 ENCOUNTER — Other Ambulatory Visit: Payer: Self-pay

## 2017-05-10 ENCOUNTER — Ambulatory Visit (INDEPENDENT_AMBULATORY_CARE_PROVIDER_SITE_OTHER): Payer: Medicare HMO | Admitting: Family Medicine

## 2017-05-10 VITALS — BP 132/74 | HR 97 | Ht 64.0 in | Wt 170.0 lb

## 2017-05-10 DIAGNOSIS — J449 Chronic obstructive pulmonary disease, unspecified: Secondary | ICD-10-CM

## 2017-05-10 DIAGNOSIS — F17200 Nicotine dependence, unspecified, uncomplicated: Secondary | ICD-10-CM | POA: Diagnosis not present

## 2017-05-10 MED ORDER — NICOTINE POLACRILEX 4 MG MT GUM
4.0000 mg | CHEWING_GUM | OROMUCOSAL | 0 refills | Status: DC | PRN
Start: 2017-05-10 — End: 2018-02-28

## 2017-05-10 MED ORDER — NICOTINE 21 MG/24HR TD PT24: 21.0000 mg | MEDICATED_PATCH | Freq: Every day | TRANSDERMAL | 0 refills | Status: DC

## 2017-05-10 NOTE — Progress Notes (Signed)
   Subjective   Patient ID: Molly Dillon    DOB: 1960/10/08, 57 y.o. female   MRN: [id number]  CC: " COPD follow-up"  HPI: [name] is a 57 y.o. female who presents to clinic today for the following:  COPD with asthma: Patient seen by me on 3/12 for mild exacerbation of COPD with asthma.  Patient completed steroid burst.  She was advised to discontinue albuterol for what she believed was wheezing and only take shortness of breath though patient continues to take albuterol throughout the day, daily.  She also takes Advair Diskus "3 puffs twice daily."  Smoking: Patient recently smoking then half a pack a day.  Discontinue smoking yesterday and began wearing the nicotine patch.  Patient is interested in cessation today.  She would like today to be her quit date.  Recently underwent low-dose contrast CT with RADS-2 with benign features.  Advised to follow-up with repeat in 1 year.  ROS: see HPI for pertinent.  [place]: HIV (controlled), HTN with nephropathy, CKDIII, HLD, COPD/asthma, depression.  Surgical history eye.  Family history unremarkable. Smoking status reviewed. Medications reviewed.  Objective   BP 132/74   Pulse 97   Ht 5\' 4"  (1.626 m)   Wt 170 lb (77.1 kg)   LMP 01/12/2011   SpO2 93%   BMI 29.18 kg/m  Vitals and nursing note reviewed.  General: well nourished, well developed, NAD with non-toxic appearance HEENT: normocephalic, atraumatic, moist mucous membranes Neck: supple, non-tender without lymphadenopathy Cardiovascular: regular rate and rhythm without murmurs, rubs, or gallops Lungs: clear to auscultation bilaterally with normal work of breathing, cough present Skin: warm, dry, no rashes or lesions, cap refill < 2 seconds Extremities: warm and well perfused, normal tone, no edema  Assessment & Plan   COPD with asthma (HCC) Chronic.  Exacerbation resolved.  Does continue to have significant cough.  No signs of pneumonia or postobstructive disease on exam.   Patient was taking albuterol for wheezing though she clearly is without wheeze today and endorses such. - Will schedule for repeat PFT - Advised to take albuterol 2 puffs every 4 hours as needed only for shortness of breath and Advair Diskus 2 puffs daily scheduled - Reviewed return precautions - RTC 2 months  Tobacco use disorder Chronic.  Motivated to stop with nicotine patch.  Uncertain if patient has totally committed though she endorses desire to stop. - Continue nicotine patch 21 mg daily and supplement with nicotine gum 4 mg as needed - Repeat LDCT 05/05/2018  No orders of the defined types were placed in this encounter.  Meds ordered this encounter  Medications  . nicotine (NICODERM CQ - DOSED IN MG/24 HOURS) 21 mg/24hr patch    Sig: Place 1 patch (21 mg total) onto the skin daily.    Dispense:  28 patch    Refill:  0  . nicotine polacrilex (NICORETTE) 4 MG gum    Sig: Take 1 each (4 mg total) by mouth as needed for smoking cessation.    Dispense:  100 tablet    Refill:  0    Harriet Butte, [place], PGY-2 05/10/2017, 5:15 PM

## 2017-05-10 NOTE — Assessment & Plan Note (Addendum)
Chronic.  Motivated to stop with nicotine patch.  Uncertain if patient has totally committed though she endorses desire to stop. - Continue nicotine patch 21 mg daily and supplement with nicotine gum 4 mg as needed - Repeat LDCT 05/05/2018

## 2017-05-10 NOTE — Assessment & Plan Note (Addendum)
Chronic.  Exacerbation resolved.  Does continue to have significant cough.  No signs of pneumonia or postobstructive disease on exam.  Patient was taking albuterol for wheezing though she clearly is without wheeze today and endorses such. - Will schedule for repeat PFT - Advised to take albuterol 2 puffs every 4 hours as needed only for shortness of breath and Advair Diskus 2 puffs daily scheduled - Reviewed return precautions - RTC 2 months

## 2017-05-10 NOTE — Patient Instructions (Signed)
Thank you for coming in to see [place] today. Please see below to review our plan for today's visit.  1.  We need to schedule you for a pulmonary function test. 2.  I sent in a refill for your nicotine patch.  Apply this to your shoulder once daily.  I also sent in a prescription for Nicorette gum.  He may have to pay out-of-pocket for this.  You can also call 1800-QUIT-NOW for free resources at any time. 3.  We will repeat your chest CT in 1 year. 4.  Below is the instructions for how to take your inhalers:  Advair Diskus: Take ONLY 2 puffs EVERY morning   Albuterol inhaler: Take 2 puffs every 4 hours as needed for shortness of breath  Please call the clinic at [phone] if your symptoms worsen or you have any concerns. It was our pleasure to serve you.  Harriet Butte, [place], PGY-2

## 2017-05-12 MED FILL — DULoxetine HCL 30 MG CPEP: 30 | 30 days supply | Qty: 60 | Fill #2

## 2017-05-12 MED FILL — LYRICA 75 MG CAPSULE: 75 | 30 days supply | Qty: 60 | Fill #2

## 2017-05-12 MED FILL — ADVAIR 250/50 DISKUS: 250-50 | 60 days supply | Qty: 60 | Fill #1

## 2017-05-12 MED FILL — TRIUMEQ 600-50-300 MG TABS: 600-50-300 | 30 days supply | Qty: 30 | Fill #4

## 2017-05-12 MED FILL — AMLODIPINE BESYLATE 10 MG T: 10 | 30 days supply | Qty: 30 | Fill #4

## 2017-05-12 MED FILL — PRAVASTATIN SODIUM 20 MG TA: 20 | 30 days supply | Qty: 30 | Fill #4

## 2017-05-19 ENCOUNTER — Ambulatory Visit: Payer: Medicare Other | Admitting: Infectious Disease

## 2017-05-21 ENCOUNTER — Encounter: Payer: Self-pay | Admitting: *Deleted

## 2017-06-11 ENCOUNTER — Other Ambulatory Visit: Payer: Self-pay | Admitting: Family Medicine

## 2017-06-11 DIAGNOSIS — M545 Low back pain, unspecified: Secondary | ICD-10-CM

## 2017-06-11 MED FILL — LYRICA 75 MG CAPSULE: 75 | 30 days supply | Qty: 60 | Fill #0

## 2017-06-14 ENCOUNTER — Other Ambulatory Visit: Payer: Self-pay | Admitting: Family Medicine

## 2017-06-14 DIAGNOSIS — M545 Low back pain, unspecified: Secondary | ICD-10-CM

## 2017-06-16 MED FILL — DULoxetine HCL 30 MG CPEP: 30 | 30 days supply | Qty: 60 | Fill #0

## 2017-06-16 MED FILL — AMLODIPINE BESYLATE 10 MG T: 10 | 30 days supply | Qty: 30 | Fill #5

## 2017-06-16 MED FILL — PRAVASTATIN SODIUM 20 MG TA: 20 | 30 days supply | Qty: 30 | Fill #5

## 2017-06-18 ENCOUNTER — Encounter: Payer: Self-pay | Admitting: *Deleted

## 2017-06-19 NOTE — Progress Notes (Signed)
Based on her perioperative risk of MACE, she is low risk and does not require further testing prior to surgery. Let me know if you have any other questions.  Harriet Butte, [place], PGY-2

## 2017-06-21 ENCOUNTER — Ambulatory Visit (INDEPENDENT_AMBULATORY_CARE_PROVIDER_SITE_OTHER): Payer: Medicare HMO | Admitting: Infectious Disease

## 2017-06-21 ENCOUNTER — Encounter: Payer: Self-pay | Admitting: Infectious Disease

## 2017-06-21 VITALS — BP 126/82 | HR 92 | Temp 98.5°F | Ht 64.0 in | Wt 165.0 lb

## 2017-06-21 DIAGNOSIS — B20 Human immunodeficiency virus [HIV] disease: Secondary | ICD-10-CM | POA: Diagnosis not present

## 2017-06-21 DIAGNOSIS — M87059 Idiopathic aseptic necrosis of unspecified femur: Secondary | ICD-10-CM | POA: Diagnosis not present

## 2017-06-21 DIAGNOSIS — I10 Essential (primary) hypertension: Secondary | ICD-10-CM

## 2017-06-21 DIAGNOSIS — J449 Chronic obstructive pulmonary disease, unspecified: Secondary | ICD-10-CM | POA: Diagnosis not present

## 2017-06-21 DIAGNOSIS — J4489 Other specified chronic obstructive pulmonary disease: Secondary | ICD-10-CM

## 2017-06-21 DIAGNOSIS — Z23 Encounter for immunization: Secondary | ICD-10-CM | POA: Diagnosis not present

## 2017-06-21 MED ORDER — BICTEGRAVIR-EMTRICITAB-TENOFOV 50-200-25 MG PO TABS: 1.0000 | ORAL_TABLET | Freq: Every day | ORAL | 11 refills | Status: DC

## 2017-06-21 MED ORDER — VARENICLINE TARTRATE 1 MG PO TABS: 1.0000 mg | ORAL_TABLET | Freq: Two times a day (BID) | ORAL | 1 refills | Status: DC

## 2017-06-21 MED ORDER — VARENICLINE TARTRATE 0.5 MG X 11 & 1 MG X 42 PO MISC
ORAL | 0 refills | Status: DC
Start: 2017-06-21 — End: 2017-10-01

## 2017-06-21 NOTE — Progress Notes (Signed)
Subjective:   Chief complaint: followup for HIV, has resumed smoking  Patient ID: Molly Dillon, female    DOB: 04/03/1960, 57 y.o.   MRN: [id number]  HPI   57 year old with HIV, Hypertension, hypertensive nephropathy, asthma and COPD   She remains with excellent adherence to her antiretroviral regimen.  I have not seen a SINGLE VL above 20 for more than 8 years.  She has began smoking again now at 0.5 pack per day. Her daughter who is with her has been encouraging relatives to take cigarettes from Mom.  Pt wants to quit but claims insurance would not cover chantix.  She is willing to try again.  We also discussed controversy re ABC and CV risk and I proposed change to [facility] for that reason as well as tackling her smoking.       Past Medical History:  Diagnosis Date  . Acute renal insufficiency 06/06/2014  . Asthma   . Constipation 07/19/2014  . COPD (chronic obstructive pulmonary disease) with acute bronchitis ([place]) 06/06/2014  . Depression   . Depression, major, recurrent, moderate ([place]) 04/02/2015  . HIV infection ([place])   . Hyperlipidemia   . Hypertension   . Hypertensive nephropathy 07/19/2014  . Unintentional weight loss 02/13/2015    No past surgical history on file.  No family history on file.    Social History   Socioeconomic History  . Marital status: Married    Spouse name: Not on file  . Number of children: Not on file  . Years of education: Not on file  . Highest education level: Not on file  Occupational History  . Not on file  Social Needs  . Financial resource strain: Not on file  . Food insecurity:    Worry: Not on file    Inability: Not on file  . Transportation needs:    Medical: Not on file    Non-medical: Not on file  Tobacco Use  . Smoking status: Former Smoker    Packs/day: 0.50    Years: 36.00    Pack years: 18.00    Types: Cigarettes    Start date: 01/13/1980  . Smokeless tobacco: Never Used  . Tobacco comment: down to  5 cigs a day  Substance and Sexual Activity  . Alcohol use: No    Alcohol/week: 0.0 oz    Frequency: Never  . Drug use: No  . Sexual activity: Never    Partners: Male    Birth control/protection: None    Comment: given condoms  Lifestyle  . Physical activity:    Days per week: Not on file    Minutes per session: Not on file  . Stress: Not on file  Relationships  . Social connections:    Talks on phone: Not on file    Gets together: Not on file    Attends religious service: Not on file    Active member of club or organization: Not on file    Attends meetings of clubs or organizations: Not on file    Relationship status: Not on file  Other Topics Concern  . Not on file  Social History Narrative  . Not on file    Allergies  Allergen Reactions  . Dapsone Shortness Of Breath  . Penicillins Hives and Shortness Of Breath     Current Outpatient Medications:  .  abacavir-dolutegravir-lamiVUDine (TRIUMEQ) 600-50-300 MG tablet, Take 1 tablet by mouth daily., Disp: 30 tablet, Rfl: 11 .  albuterol (PROVENTIL  HFA;VENTOLIN HFA) 108 (90 Base) MCG/ACT inhaler, Inhale 2 puffs into the lungs every 6 (six) hours as needed for wheezing., Disp: 1 Inhaler, Rfl: 11 .  amLODipine (NORVASC) 10 MG tablet, Take 1 tablet (10 mg total) by mouth daily., Disp: 30 tablet, Rfl: 11 .  buPROPion (WELLBUTRIN SR) 150 MG 12 hr tablet, Take 1 tablet (150 mg total) by mouth daily., Disp: 30 tablet, Rfl: 1 .  DULoxetine (CYMBALTA) 30 MG capsule, TAKE 2 CAPSULES BY MOUTH DAILY, Disp: 60 capsule, Rfl: 2 .  Fluticasone-Salmeterol (ADVAIR DISKUS) 250-50 MCG/DOSE AEPB, Inhale 1 puff into the lungs 2 (two) times daily., Disp: 60 each, Rfl: 11 .  LYRICA 75 MG capsule, TAKE 1 CAPSULE (75 MG TOTAL) BY MOUTH 2 (TWO) TIMES DAILY., Disp: 60 capsule, Rfl: 2 .  nicotine (NICODERM CQ - DOSED IN MG/24 HOURS) 21 mg/24hr patch, Place 1 patch (21 mg total) onto the skin daily., Disp: 28 patch, Rfl: 0 .  nicotine polacrilex  (NICORETTE) 4 MG gum, Take 1 each (4 mg total) by mouth as needed for smoking cessation., Disp: 100 tablet, Rfl: 0 .  oxyCODONE-acetaminophen (PERCOCET) 10-325 MG tablet, Take 1 tablet by mouth every 6 (six) hours as needed., Disp: , Rfl: 0 .  pravastatin (PRAVACHOL) 20 MG tablet, Take 1 tablet (20 mg total) by mouth daily. Reported on 04/02/2015, Disp: 30 tablet, Rfl: 11 .  triamcinolone ointment (KENALOG) 0.5 %, Apply 1 application topically 2 (two) times daily., Disp: 30 g, Rfl: 3   Review of Systems  Constitutional: Negative for activity change, chills, diaphoresis and fever.  HENT: Negative for congestion, postnasal drip, rhinorrhea, sinus pressure, sneezing, sore throat and trouble swallowing.   Eyes: Negative for photophobia and visual disturbance.  Respiratory: Positive for cough. Negative for chest tightness, shortness of breath, wheezing and stridor.   Cardiovascular: Negative for chest pain, palpitations and leg swelling.  Gastrointestinal: Negative for abdominal distention, abdominal pain, anal bleeding, blood in stool, diarrhea, nausea and vomiting.  Genitourinary: Negative for difficulty urinating, dysuria, flank pain and hematuria.  Musculoskeletal: Positive for back pain. Negative for arthralgias, gait problem and joint swelling.  Skin: Negative for color change, pallor and wound.  Neurological: Negative for dizziness, tremors, weakness and light-headedness.  Hematological: Negative for adenopathy. Does not bruise/bleed easily.  Psychiatric/Behavioral: Negative for agitation, behavioral problems, confusion, dysphoric mood and sleep disturbance.       Objective:   Physical Exam  Constitutional: She is oriented to person, place, and time. She appears well-developed and well-nourished. No distress.  HENT:  Head: Normocephalic and atraumatic.  Mouth/Throat: No oropharyngeal exudate.  Eyes: Conjunctivae and EOM are normal. No scleral icterus.  Neck: Normal range of motion. Neck  supple.  Cardiovascular: Normal rate, regular rhythm and normal heart sounds. Exam reveals no gallop and no friction rub.  No murmur heard. Pulmonary/Chest: Effort normal. No respiratory distress. She has no wheezes. She has rales.  Abdominal: Soft. She exhibits no distension. There is no tenderness.  Musculoskeletal: She exhibits no edema or tenderness.       Right hip: She exhibits normal range of motion.       Left hip: She exhibits normal range of motion.     Neurological: She is alert and oriented to person, place, and time. She exhibits normal muscle tone. Coordination normal.  Skin: Skin is warm and dry. She is not diaphoretic. No erythema. No pallor.  Psychiatric: She has a normal mood and affect. Her behavior is normal. Judgment and thought content normal.  Assessment & Plan:   HIV:  Switch to BIKTARVY and check labs in 2 months  Hypertensive nephropathy and now CKD: Renew amlodipine,  There were no vitals filed for this visit. Lab Results  Component Value Date   CREATININE 1.36 (H) 12/31/2016   CREATININE 1.55 (H) 06/15/2016   CREATININE 1.43 (H) 11/13/2015    Smoking: called chantix in  COPD:with asthma, continue inhaled corticosteroids and prn broncholdilators.,    I spent greater than 25 minutes with the patient including greater than 50% of time in face to face counsel of the patient re need to stop smoking, re new ARV regimen  and in coordination of her care.

## 2017-06-22 MED FILL — BIKTARVY 50-200-25 MG TABS: 50-200-25 | 30 days supply | Qty: 30 | Fill #0

## 2017-06-22 MED FILL — CHANTIX STARTING MONTH BOX: 0.5 MG X 11 | 32 days supply | Qty: 53 | Fill #0

## 2017-06-24 ENCOUNTER — Encounter: Payer: Self-pay | Admitting: Podiatry

## 2017-06-24 DIAGNOSIS — M2042 Other hammer toe(s) (acquired), left foot: Secondary | ICD-10-CM | POA: Diagnosis not present

## 2017-06-24 DIAGNOSIS — M2012 Hallux valgus (acquired), left foot: Secondary | ICD-10-CM | POA: Diagnosis not present

## 2017-06-24 DIAGNOSIS — M7752 Other enthesopathy of left foot: Secondary | ICD-10-CM | POA: Diagnosis not present

## 2017-06-30 ENCOUNTER — Other Ambulatory Visit: Payer: Self-pay | Admitting: Podiatry

## 2017-06-30 ENCOUNTER — Ambulatory Visit (INDEPENDENT_AMBULATORY_CARE_PROVIDER_SITE_OTHER): Payer: Medicare HMO

## 2017-06-30 ENCOUNTER — Ambulatory Visit (INDEPENDENT_AMBULATORY_CARE_PROVIDER_SITE_OTHER): Payer: Medicare HMO | Admitting: Podiatry

## 2017-06-30 DIAGNOSIS — M21612 Bunion of left foot: Secondary | ICD-10-CM

## 2017-06-30 DIAGNOSIS — Q6689 Other  specified congenital deformities of feet: Secondary | ICD-10-CM

## 2017-06-30 DIAGNOSIS — M21619 Bunion of unspecified foot: Secondary | ICD-10-CM

## 2017-06-30 DIAGNOSIS — Z9889 Other specified postprocedural states: Secondary | ICD-10-CM

## 2017-06-30 MED ORDER — DOXYCYCLINE HYCLATE 100 MG PO TABS: 100.0000 mg | ORAL_TABLET | Freq: Two times a day (BID) | ORAL | 0 refills | Status: DC

## 2017-07-06 NOTE — Progress Notes (Signed)
   Subjective:  Patient presents today status post bunionectomy and hammertoe repair of digits 2-5 of the left foot. DOS: 06/24/17. She states she is doing well overall. She denies any new complaints at this time. Patient is here for further evaluation and treatment.    Past Medical History:  Diagnosis Date  . Acute renal insufficiency 06/06/2014  . Asthma   . Constipation 07/19/2014  . COPD (chronic obstructive pulmonary disease) with acute bronchitis ([place]) 06/06/2014  . Depression   . Depression, major, recurrent, moderate ([place]) 04/02/2015  . HIV infection ([place])   . Hyperlipidemia   . Hypertension   . Hypertensive nephropathy 07/19/2014  . Unintentional weight loss 02/13/2015      Objective/Physical Exam Neurovascular status intact.  Skin incisions appear to be well coapted with sutures and staples intact. No sign of infectious process noted. No dehiscence. No active bleeding noted. Moderate edema noted to the surgical extremity.  Radiographic Exam:  Orthopedic hardware and osteotomies sites appear to be stable with routine healing.  Assessment: 1. s/p bunionectomy and hammertoe repair of digits 2-5 left. DOS: 06/24/17   Plan of Care:  1. Patient was evaluated. X-rays reviewed 2. Dressing changed. Keep clean, dry and intact for one week.  3. Prescription for Doxycycline #20 provided to patient.  4. Discontinue using CAM boot. Post op shoe dispensed. Weightbearing as tolerated.  5. Return to clinic in one week.   Edrick Kins, DPM [facility]  Dr. Edrick Kins, [address]                                        [place], [place] [address]                Office [phone]  Fax [phone]

## 2017-07-07 ENCOUNTER — Ambulatory Visit (INDEPENDENT_AMBULATORY_CARE_PROVIDER_SITE_OTHER): Payer: Medicare HMO | Admitting: Podiatry

## 2017-07-07 DIAGNOSIS — M21619 Bunion of unspecified foot: Secondary | ICD-10-CM

## 2017-07-07 DIAGNOSIS — Q6689 Other  specified congenital deformities of feet: Secondary | ICD-10-CM

## 2017-07-07 DIAGNOSIS — Z9889 Other specified postprocedural states: Secondary | ICD-10-CM

## 2017-07-11 NOTE — Progress Notes (Signed)
   Subjective:  Patient presents today status post bunionectomy and hammertoe repair of digits 2-5 of the left foot. DOS: 06/24/17. She states she is doing well. She reports some swelling to the midfoot. She has been wearing the post op shoe and taking the antibiotic as directed. Patient is here for further evaluation and treatment.    Past Medical History:  Diagnosis Date  . Acute renal insufficiency 06/06/2014  . Asthma   . Constipation 07/19/2014  . COPD (chronic obstructive pulmonary disease) with acute bronchitis ([place]) 06/06/2014  . Depression   . Depression, major, recurrent, moderate ([place]) 04/02/2015  . HIV infection ([place])   . Hyperlipidemia   . Hypertension   . Hypertensive nephropathy 07/19/2014  . Unintentional weight loss 02/13/2015      Objective/Physical Exam Neurovascular status intact.  Skin incisions appear to be well coapted with sutures and staples intact. No sign of infectious process noted. No dehiscence. No active bleeding noted. Heavy amount of edema noted throughout the dorsal midfoot.    Assessment: 1. s/p bunionectomy and hammertoe repair of digits 2-5 left. DOS: 06/24/17   Plan of Care:  1. Patient was evaluated.  2. Partial staples removed. Dry sterile dressing applied. Keep clean, dry and intact for one week.  3. Continue minimal weightbearing in post op shoe.  4. Finish oral antibiotic.  5. Return to clinic in one week.   Edrick Kins, DPM [facility]  Dr. Edrick Kins, [address]                                        [place], [place] [address]                Office [phone]  Fax [phone]

## 2017-07-14 ENCOUNTER — Ambulatory Visit (INDEPENDENT_AMBULATORY_CARE_PROVIDER_SITE_OTHER): Payer: Medicare HMO

## 2017-07-14 ENCOUNTER — Ambulatory Visit (INDEPENDENT_AMBULATORY_CARE_PROVIDER_SITE_OTHER): Payer: Medicare HMO | Admitting: Podiatry

## 2017-07-14 DIAGNOSIS — Z9889 Other specified postprocedural states: Secondary | ICD-10-CM

## 2017-07-14 DIAGNOSIS — M2042 Other hammer toe(s) (acquired), left foot: Secondary | ICD-10-CM

## 2017-07-14 DIAGNOSIS — Q6689 Other  specified congenital deformities of feet: Secondary | ICD-10-CM | POA: Diagnosis not present

## 2017-07-19 MED FILL — AMLODIPINE BESYLATE 10 MG T: 10 | 30 days supply | Qty: 30 | Fill #6

## 2017-07-19 MED FILL — CHANTIX 1 MG CONT MONTH BOX: 1 | 30 days supply | Qty: 56 | Fill #0

## 2017-07-19 MED FILL — PRAVASTATIN SODIUM 20 MG TA: 20 | 30 days supply | Qty: 30 | Fill #6

## 2017-07-19 MED FILL — DULoxetine HCL 30 MG CPEP: 30 | 30 days supply | Qty: 60 | Fill #1

## 2017-07-19 MED FILL — LYRICA 75 MG CAPSULE: 75 | 30 days supply | Qty: 60 | Fill #1

## 2017-07-19 MED FILL — BIKTARVY 50-200-25 MG TABS: 50-200-25 | 30 days supply | Qty: 30 | Fill #1

## 2017-07-21 ENCOUNTER — Encounter: Payer: Self-pay | Admitting: Podiatry

## 2017-07-21 ENCOUNTER — Ambulatory Visit (INDEPENDENT_AMBULATORY_CARE_PROVIDER_SITE_OTHER): Payer: Medicare HMO

## 2017-07-21 ENCOUNTER — Ambulatory Visit (INDEPENDENT_AMBULATORY_CARE_PROVIDER_SITE_OTHER): Payer: Medicare HMO | Admitting: Podiatry

## 2017-07-21 DIAGNOSIS — M2042 Other hammer toe(s) (acquired), left foot: Secondary | ICD-10-CM

## 2017-07-21 DIAGNOSIS — Z9889 Other specified postprocedural states: Secondary | ICD-10-CM

## 2017-07-22 NOTE — Progress Notes (Signed)
   Subjective:  Patient presents today status post bunionectomy and hammertoe repair of digits 2-5 of the left foot. DOS: 06/24/17. She states she is doing well overall. She denies any complaints or modifying factors. She has been weightbearing in the post op shoe as directed without issue. Patient is here for further evaluation and treatment.    Past Medical History:  Diagnosis Date  . Acute renal insufficiency 06/06/2014  . Asthma   . Constipation 07/19/2014  . COPD (chronic obstructive pulmonary disease) with acute bronchitis ([place]) 06/06/2014  . Depression   . Depression, major, recurrent, moderate ([place]) 04/02/2015  . HIV infection ([place])   . Hyperlipidemia   . Hypertension   . Hypertensive nephropathy 07/19/2014  . Unintentional weight loss 02/13/2015      Objective/Physical Exam Neurovascular status intact.  Skin incisions appear to be well coapted with sutures and staples intact. No sign of infectious process noted. No dehiscence. No active bleeding noted. Heavy amount of edema noted throughout the dorsal midfoot.   Radiographic Exam:  Orthopedic hardware and osteotomies sites appear to be stable with routine healing.  Assessment: 1. s/p bunionectomy and hammertoe repair of digits 2-5 left. DOS: 06/24/17   Plan of Care:  1. Patient was evaluated. X-Rays reviewed.  2. Staples and percutaneous pins removed.  3. Dry sterile dressing applied with betadine. Keep clean, dry and intact for one week.  4. Continue weightbearing in post op shoe.  5. Return to clinic in one week.   Edrick Kins, DPM [facility]  Dr. Edrick Kins, [address]                                        [place], [place] [address]                Office [phone]  Fax [phone]

## 2017-07-25 NOTE — Progress Notes (Signed)
   Subjective:  Patient presents today status post bunionectomy and hammertoe repair of digits 2-5 of the left foot. DOS: 06/24/17. She states she is doing well overall. She reports only minimal intermittent pain. She denies modifying factors. She has been wearing the post op shoe as directed. Patient is here for further evaluation and treatment.    Past Medical History:  Diagnosis Date  . Acute renal insufficiency 06/06/2014  . Asthma   . Constipation 07/19/2014  . COPD (chronic obstructive pulmonary disease) with acute bronchitis ([place]) 06/06/2014  . Depression   . Depression, major, recurrent, moderate ([place]) 04/02/2015  . HIV infection ([place])   . Hyperlipidemia   . Hypertension   . Hypertensive nephropathy 07/19/2014  . Unintentional weight loss 02/13/2015      Objective/Physical Exam Neurovascular status intact.  Skin incisions appear to be well coapted. No sign of infectious process noted. No dehiscence. No active bleeding noted. Heavy amount of edema noted throughout the dorsal midfoot.   Wound #1 noted to the left third toe measuring 1.0 x 1.0 x 0.2 cm  To the above-noted ulceration, there is no eschar. There is a moderate amount of slough, fibrin and necrotic tissue. Granulation tissue and wound base is red. There is no malodor. There is a minimal amount of serosanginous drainage noted. Periwound integrity is intact.   Radiographic Exam:  Orthopedic hardware and osteotomies sites appear to be stable with routine healing.  Assessment: 1. s/p bunionectomy and hammertoe repair of digits 2-5 left. DOS: 06/24/17 2. Ulceration of the left third toe   Plan of Care:  1. Patient was evaluated. X-Rays reviewed.  2. Medically necessary excisional debridement including subcutaneous tissue was performed using a tissue nipper and a chisel blade. Excisional debridement of all the necrotic nonviable tissue down to healthy bleeding viable tissue was performed with post-debridement measurements same  as pre-. 3. The wound was cleansed and dry sterile dressing applied. 4. Recommended Betadine daily with a dry sterile dressing.  5. Continue weightbearing in post op shoe.  6. Recommended Epsom salt soaks daily.  7. Return to clinic in 3 weeks.    Edrick Kins, DPM [facility]  Dr. Edrick Kins, [address]                                        [place], [place] [address]                Office [phone]  Fax [phone]

## 2017-08-11 ENCOUNTER — Ambulatory Visit (INDEPENDENT_AMBULATORY_CARE_PROVIDER_SITE_OTHER): Payer: Medicare HMO | Admitting: Podiatry

## 2017-08-11 ENCOUNTER — Ambulatory Visit (INDEPENDENT_AMBULATORY_CARE_PROVIDER_SITE_OTHER): Payer: Medicare HMO

## 2017-08-11 DIAGNOSIS — Z9889 Other specified postprocedural states: Secondary | ICD-10-CM

## 2017-08-11 DIAGNOSIS — M2042 Other hammer toe(s) (acquired), left foot: Secondary | ICD-10-CM

## 2017-08-11 NOTE — Progress Notes (Signed)
   Subjective:  Patient presents today status post bunionectomy and hammertoe repair of digits 2-5 of the left foot. DOS: 06/24/17. She states she is doing well overall.  She continues to experience a little bit of drainage from the third toe of the surgical foot.  She has been soaking her foot in warm water and Epsom salt and also applying Betadine daily to the toe ulceration third digit.  She is also been weightbearing in the postoperative shoe as directed.  Past Medical History:  Diagnosis Date  . Acute renal insufficiency 06/06/2014  . Asthma   . Constipation 07/19/2014  . COPD (chronic obstructive pulmonary disease) with acute bronchitis ([place]) 06/06/2014  . Depression   . Depression, major, recurrent, moderate ([place]) 04/02/2015  . HIV infection ([place])   . Hyperlipidemia   . Hypertension   . Hypertensive nephropathy 07/19/2014  . Unintentional weight loss 02/13/2015      Objective/Physical Exam Neurovascular status intact.  Skin incisions appear to be well coapted. No sign of infectious process noted. No dehiscence. No active bleeding noted.  Minimal amount of edema throughout the surgical extremity.  Edema appears to be improved significantly.  Wound #1 noted to the left third toe measuring 1.0 x 1.0 x 0.2 cm  To the above-noted ulceration, there is no eschar. There is a moderate amount of slough, fibrin and necrotic tissue. Granulation tissue and wound base is red. There is no malodor. There is a minimal amount of serosanginous drainage noted. Periwound integrity is intact.   Radiographic Exam:  Orthopedic hardware and osteotomies sites appear to be stable with some delayed healing of the osteotomy bunion site.  Orthopedic hardware is intact however.  Assessment: 1. s/p bunionectomy and hammertoe repair of digits 2-5 left. DOS: 06/24/17 2. Ulceration of the left third toe   Plan of Care:  1. Patient was evaluated. X-Rays reviewed.  2. Medically necessary excisional debridement including  subcutaneous tissue was performed using a tissue nipper and a chisel blade. Excisional debridement of all the necrotic nonviable tissue down to healthy bleeding viable tissue was performed with post-debridement measurements same as pre-.  Dry sterile dressings were applied.  Keep clean dry and intact for 1 week 3.  Today wound cultures were taken of the ulceration of the third toe left foot 4.  Prescription for Bactrim DS, more so prophylactically to prevent infection and ensure that the distal tuft ulceration does not become infected due to the close proximity of the bone 5.  Continue weightbearing in the postoperative shoe 6.  Return to clinic in 1 week  Edrick Kins, DPM [facility]  Dr. Edrick Kins, [address]                                        [place], [place] [address]                Office [phone]  Fax [phone]

## 2017-08-12 ENCOUNTER — Encounter: Payer: Self-pay | Admitting: Podiatry

## 2017-08-12 ENCOUNTER — Other Ambulatory Visit: Payer: Medicare HMO

## 2017-08-12 MED FILL — PREGABALIN 75 MG CAPS: 75 | 30 days supply | Qty: 60 | Fill #2

## 2017-08-12 MED FILL — PRAVASTATIN SODIUM 20 MG TA: 20 | 30 days supply | Qty: 30 | Fill #7

## 2017-08-12 MED FILL — BIKTARVY 50-200-25 MG TABS: 50-200-25 | 30 days supply | Qty: 30 | Fill #2

## 2017-08-12 MED FILL — DULoxetine HCL 30 MG CPEP: 30 | 30 days supply | Qty: 60 | Fill #2

## 2017-08-12 MED FILL — CHANTIX 1 MG CONT MONTH BOX: 1 | 30 days supply | Qty: 56 | Fill #1

## 2017-08-12 MED FILL — AMLODIPINE BESYLATE 10 MG T: 10 | 30 days supply | Qty: 30 | Fill #7

## 2017-08-15 ENCOUNTER — Other Ambulatory Visit: Payer: Self-pay | Admitting: Podiatry

## 2017-08-15 LAB — WOUND CULTURE
MICRO NUMBER:: 90905518
SPECIMEN QUALITY:: ADEQUATE

## 2017-08-16 NOTE — Telephone Encounter (Signed)
Culture results are posted. Just prescribe levaquin daily #14.   Dr. Doreen Beam

## 2017-08-17 ENCOUNTER — Telehealth: Payer: Self-pay | Admitting: *Deleted

## 2017-08-17 MED ORDER — LEVOFLOXACIN 500 MG PO TABS: 500.0000 mg | ORAL_TABLET | Freq: Every day | ORAL | 0 refills | Status: DC

## 2017-08-17 MED FILL — levoFLOXacin 500 MG TABS: 500 | 14 days supply | Qty: 14 | Fill #0

## 2017-08-17 NOTE — Telephone Encounter (Signed)
Dr. Amalia Hailey states he has reviewed the wound culture and would like pt to begin Levaquin 500mg  #14 one tablet daily. Emailed instructions to pt.

## 2017-08-18 ENCOUNTER — Other Ambulatory Visit: Payer: Self-pay

## 2017-08-18 ENCOUNTER — Ambulatory Visit (INDEPENDENT_AMBULATORY_CARE_PROVIDER_SITE_OTHER): Payer: Medicare HMO | Admitting: Podiatry

## 2017-08-18 DIAGNOSIS — M2042 Other hammer toe(s) (acquired), left foot: Secondary | ICD-10-CM

## 2017-08-18 DIAGNOSIS — Z9889 Other specified postprocedural states: Secondary | ICD-10-CM

## 2017-08-18 MED ORDER — GENTAMICIN SULFATE 0.1 % EX CREA: 1.0000 "application " | TOPICAL_CREAM | Freq: Three times a day (TID) | CUTANEOUS | 1 refills | Status: DC

## 2017-08-18 MED ORDER — CLINDAMYCIN HCL 300 MG PO CAPS: 300.0000 mg | ORAL_CAPSULE | Freq: Three times a day (TID) | ORAL | 0 refills | Status: DC

## 2017-08-18 NOTE — Telephone Encounter (Signed)
Okay to refill? 

## 2017-08-22 NOTE — Progress Notes (Signed)
   Subjective:  Patient presents today status post bunionectomy and hammertoe repair of digits 2-5 of the left foot. DOS: 06/24/17. She reports an improvement in the swelling. She states the 3rd toe is still not healed properly. There are no modifying factors. Patient is here for further evaluation and treatment.   Past Medical History:  Diagnosis Date  . Acute renal insufficiency 06/06/2014  . Asthma   . Constipation 07/19/2014  . COPD (chronic obstructive pulmonary disease) with acute bronchitis ([place]) 06/06/2014  . Depression   . Depression, major, recurrent, moderate ([place]) 04/02/2015  . HIV infection ([place])   . Hyperlipidemia   . Hypertension   . Hypertensive nephropathy 07/19/2014  . Unintentional weight loss 02/13/2015      Objective/Physical Exam Neurovascular status intact.  Skin incisions appear to be well coapted. No sign of infectious process noted. No dehiscence. No active bleeding noted.  Minimal amount of edema throughout the surgical extremity.  Edema appears to be improved significantly.  Wound #1 noted to the left third toe measuring 1.0 x 1.0 x 0.2 cm  To the above-noted ulceration, there is no eschar. There is a moderate amount of slough, fibrin and necrotic tissue. Granulation tissue and wound base is red. There is no malodor. There is a minimal amount of serosanginous drainage noted. Periwound integrity is intact.    Assessment: 1. s/p bunionectomy and hammertoe repair of digits 2-5 left. DOS: 06/24/17 2. Ulceration of the left third toe   Plan of Care:  1. Patient was evaluated. Cultures reviewed.  2. Discontinue taking Bactrim DS.  3. Prescription for Clindamycin 300 mg three times daily #30 provided to patient.  4. Prescription for Gentamicin cream to be used daily with a bandage provided to patient.  5. Continue using post op shoe.  6. Return to clinic in 3 weeks.    Edrick Kins, DPM [facility]  Dr. Edrick Kins, [address]                                        [place], [place] [address]                Office [phone]  Fax [phone]

## 2017-08-31 ENCOUNTER — Encounter: Payer: Medicare HMO | Admitting: Infectious Disease

## 2017-09-03 ENCOUNTER — Other Ambulatory Visit: Payer: Self-pay | Admitting: Family Medicine

## 2017-09-03 ENCOUNTER — Other Ambulatory Visit: Payer: Self-pay | Admitting: Infectious Disease

## 2017-09-03 DIAGNOSIS — M545 Low back pain, unspecified: Secondary | ICD-10-CM

## 2017-09-03 DIAGNOSIS — B20 Human immunodeficiency virus [HIV] disease: Secondary | ICD-10-CM

## 2017-09-08 ENCOUNTER — Ambulatory Visit (INDEPENDENT_AMBULATORY_CARE_PROVIDER_SITE_OTHER): Payer: Medicare HMO | Admitting: Podiatry

## 2017-09-08 DIAGNOSIS — M2042 Other hammer toe(s) (acquired), left foot: Secondary | ICD-10-CM

## 2017-09-08 DIAGNOSIS — Z9889 Other specified postprocedural states: Secondary | ICD-10-CM

## 2017-09-09 MED FILL — DULoxetine HCL 30 MG CPEP: 30 | 30 days supply | Qty: 60 | Fill #0

## 2017-09-09 MED FILL — PREGABALIN 75 MG CAPS: 75 | 30 days supply | Qty: 60 | Fill #0

## 2017-09-09 MED FILL — AMLODIPINE BESYLATE 10 MG T: 10 | 30 days supply | Qty: 30 | Fill #8

## 2017-09-09 MED FILL — PRAVASTATIN SODIUM 20 MG TA: 20 | 30 days supply | Qty: 30 | Fill #8

## 2017-09-09 MED FILL — BIKTARVY 50-200-25 MG TABS: 50-200-25 | 30 days supply | Qty: 30 | Fill #3

## 2017-09-09 MED FILL — CHANTIX 1 MG CONT MONTH BOX: 1 | 28 days supply | Qty: 56 | Fill #0

## 2017-09-11 NOTE — Progress Notes (Signed)
   Subjective:  Patient presents today status post bunionectomy and hammertoe repair of digits 2-5 of the left foot. DOS: 06/24/17. She is here for follow up of an ulceration of the left third toe. She reports some continued swelling. There are no modifying factors noted. She has been using the gentamicin cream and post op shoe as directed. Patient is here for further evaluation and treatment.   Past Medical History:  Diagnosis Date  . Acute renal insufficiency 06/06/2014  . Asthma   . Constipation 07/19/2014  . COPD (chronic obstructive pulmonary disease) with acute bronchitis ([place]) 06/06/2014  . Depression   . Depression, major, recurrent, moderate ([place]) 04/02/2015  . HIV infection ([place])   . Hyperlipidemia   . Hypertension   . Hypertensive nephropathy 07/19/2014  . Unintentional weight loss 02/13/2015      Objective/Physical Exam Neurovascular status intact.  Skin incisions appear to be well coapted. No sign of infectious process noted. No dehiscence. No active bleeding noted.  Minimal amount of edema throughout the surgical extremity.  Edema appears to be improved significantly.  Wound #1 noted to the left third toe measuring 0.5 x 0.5 x 0.2 cm  To the above-noted ulceration, there is no eschar. There is a moderate amount of slough, fibrin and necrotic tissue. Granulation tissue and wound base is red. There is no malodor. There is a minimal amount of serosanginous drainage noted. Periwound integrity is intact.    Assessment: 1. s/p bunionectomy and hammertoe repair of digits 2-5 left. DOS: 06/24/17 2. Ulceration of the left third toe   Plan of Care:  1. Patient was evaluated.  2. Continue using gentamicin cream daily with a bandage.  3. Continue using post op shoe.  4. Return to clinic in 4 weeks.    Edrick Kins, DPM [facility]  Dr. Edrick Kins, [address]                                        [place], [place] [address]                Office  [phone]  Fax [phone]

## 2017-09-15 ENCOUNTER — Other Ambulatory Visit: Payer: Medicare HMO

## 2017-09-15 ENCOUNTER — Other Ambulatory Visit: Payer: Self-pay | Admitting: *Deleted

## 2017-09-15 DIAGNOSIS — B20 Human immunodeficiency virus [HIV] disease: Secondary | ICD-10-CM

## 2017-09-16 LAB — T-HELPER CELL (CD4) - (RCID CLINIC ONLY)
CD4 % Helper T Cell: 21 % — ABNORMAL LOW (ref 33–55)
CD4 T Cell Abs: 710 /uL (ref 400–2700)

## 2017-09-17 LAB — COMPLETE METABOLIC PANEL WITH GFR
AG Ratio: 1.2 (calc) (ref 1.0–2.5)
ALKALINE PHOSPHATASE (APISO): 168 U/L — AB (ref 33–130)
ALT: 13 U/L (ref 6–29)
AST: 17 U/L (ref 10–35)
Albumin: 4.4 g/dL (ref 3.6–5.1)
BILIRUBIN TOTAL: 0.3 mg/dL (ref 0.2–1.2)
BUN / CREAT RATIO: 16 (calc) (ref 6–22)
BUN: 22 mg/dL (ref 7–25)
CHLORIDE: 103 mmol/L (ref 98–110)
CO2: 28 mmol/L (ref 20–32)
CREATININE: 1.38 mg/dL — AB (ref 0.50–1.05)
Calcium: 9.7 mg/dL (ref 8.6–10.4)
GFR, Est African American: 49 mL/min/{1.73_m2} — ABNORMAL LOW (ref >=60)
GFR, Est Non African American: 43 mL/min/{1.73_m2} — ABNORMAL LOW (ref >=60)
GLOBULIN: 3.6 g/dL (ref 1.9–3.7)
GLUCOSE: 82 mg/dL (ref 65–99)
POTASSIUM: 4.5 mmol/L (ref 3.5–5.3)
SODIUM: 140 mmol/L (ref 135–146)
Total Protein: 8 g/dL (ref 6.1–8.1)

## 2017-09-17 LAB — CBC WITH DIFFERENTIAL/PLATELET
BASOS PCT: 0.9 %
Basophils Absolute: 62 cells/uL (ref 0–200)
EOS PCT: 2.5 %
Eosinophils Absolute: 173 cells/uL (ref 15–500)
HCT: 42.8 % (ref 35.0–45.0)
Hemoglobin: 14.7 g/dL (ref 11.7–15.5)
Lymphs Abs: 3105 cells/uL (ref 850–3900)
MCH: 31.2 pg (ref 27.0–33.0)
MCHC: 34.3 g/dL (ref 32.0–36.0)
MCV: 90.9 fL (ref 80.0–100.0)
MONOS PCT: 7.6 %
MPV: 10 fL (ref 7.5–12.5)
NEUTROS PCT: 44 %
Neutro Abs: 3036 cells/uL (ref 1500–7800)
Platelets: 296 10*3/uL (ref 140–400)
RBC: 4.71 10*6/uL (ref 3.80–5.10)
RDW: 13.5 % (ref 11.0–15.0)
TOTAL LYMPHOCYTE: 45 %
WBC: 6.9 10*3/uL (ref 3.8–10.8)
WBCMIX: 524 {cells}/uL (ref 200–950)

## 2017-09-17 LAB — HIV-1 RNA QUANT-NO REFLEX-BLD
HIV 1 RNA QUANT: NOT DETECTED {copies}/mL
HIV-1 RNA Quant, Log: <1.3 Log copies/mL

## 2017-09-28 ENCOUNTER — Other Ambulatory Visit: Payer: Self-pay

## 2017-09-28 ENCOUNTER — Ambulatory Visit (INDEPENDENT_AMBULATORY_CARE_PROVIDER_SITE_OTHER): Payer: Medicare HMO | Admitting: Family Medicine

## 2017-09-28 ENCOUNTER — Emergency Department (HOSPITAL_COMMUNITY): Payer: Medicare HMO

## 2017-09-28 ENCOUNTER — Encounter: Payer: Self-pay | Admitting: Family Medicine

## 2017-09-28 ENCOUNTER — Encounter (HOSPITAL_COMMUNITY): Payer: Self-pay | Admitting: Internal Medicine

## 2017-09-28 ENCOUNTER — Inpatient Hospital Stay (HOSPITAL_COMMUNITY)
Admission: EM | Admit: 2017-09-28 | Discharge: 2017-10-01 | DRG: 190 | Disposition: A | Discharge status: Home / Self Care | Payer: Medicare HMO | Attending: Family Medicine | Admitting: Family Medicine

## 2017-09-28 VITALS — BP 130/72 | HR 122 | Temp 98.4°F | Wt 164.0 lb

## 2017-09-28 DIAGNOSIS — J449 Chronic obstructive pulmonary disease, unspecified: Secondary | ICD-10-CM | POA: Diagnosis not present

## 2017-09-28 DIAGNOSIS — I129 Hypertensive chronic kidney disease with stage 1 through stage 4 chronic kidney disease, or unspecified chronic kidney disease: Secondary | ICD-10-CM | POA: Diagnosis present

## 2017-09-28 DIAGNOSIS — R042 Hemoptysis: Secondary | ICD-10-CM | POA: Diagnosis present

## 2017-09-28 DIAGNOSIS — G8929 Other chronic pain: Secondary | ICD-10-CM | POA: Diagnosis present

## 2017-09-28 DIAGNOSIS — L309 Dermatitis, unspecified: Secondary | ICD-10-CM | POA: Diagnosis present

## 2017-09-28 DIAGNOSIS — N179 Acute kidney failure, unspecified: Secondary | ICD-10-CM

## 2017-09-28 DIAGNOSIS — N183 Chronic kidney disease, stage 3 (moderate): Secondary | ICD-10-CM | POA: Diagnosis present

## 2017-09-28 DIAGNOSIS — R000 Tachycardia, unspecified: Secondary | ICD-10-CM | POA: Diagnosis present

## 2017-09-28 DIAGNOSIS — F17210 Nicotine dependence, cigarettes, uncomplicated: Secondary | ICD-10-CM | POA: Diagnosis present

## 2017-09-28 DIAGNOSIS — E785 Hyperlipidemia, unspecified: Secondary | ICD-10-CM | POA: Diagnosis present

## 2017-09-28 DIAGNOSIS — J441 Chronic obstructive pulmonary disease with (acute) exacerbation: Secondary | ICD-10-CM | POA: Diagnosis present

## 2017-09-28 DIAGNOSIS — B20 Human immunodeficiency virus [HIV] disease: Secondary | ICD-10-CM

## 2017-09-28 DIAGNOSIS — J9811 Atelectasis: Secondary | ICD-10-CM | POA: Diagnosis present

## 2017-09-28 DIAGNOSIS — Z880 Allergy status to penicillin: Secondary | ICD-10-CM

## 2017-09-28 DIAGNOSIS — Z881 Allergy status to other antibiotic agents status: Secondary | ICD-10-CM | POA: Diagnosis not present

## 2017-09-28 DIAGNOSIS — J9601 Acute respiratory failure with hypoxia: Secondary | ICD-10-CM

## 2017-09-28 DIAGNOSIS — Z7951 Long term (current) use of inhaled steroids: Secondary | ICD-10-CM | POA: Diagnosis not present

## 2017-09-28 DIAGNOSIS — Z21 Asymptomatic human immunodeficiency virus [HIV] infection status: Secondary | ICD-10-CM | POA: Diagnosis present

## 2017-09-28 DIAGNOSIS — F329 Major depressive disorder, single episode, unspecified: Secondary | ICD-10-CM | POA: Diagnosis present

## 2017-09-28 DIAGNOSIS — Z79899 Other long term (current) drug therapy: Secondary | ICD-10-CM

## 2017-09-28 DIAGNOSIS — E860 Dehydration: Secondary | ICD-10-CM | POA: Diagnosis present

## 2017-09-28 LAB — CBC WITH DIFFERENTIAL/PLATELET
Abs Immature Granulocytes: 0.1 10*3/uL (ref 0.0–0.1)
Basophils Absolute: 0.1 10*3/uL (ref 0.0–0.1)
Basophils Relative: 0 %
Eosinophils Absolute: 0.2 10*3/uL (ref 0.0–0.7)
Eosinophils Relative: 1 %
HCT: 43.5 % (ref 36.0–46.0)
Hemoglobin: 14.2 g/dL (ref 12.0–15.0)
Immature Granulocytes: 0 %
Lymphocytes Relative: 32 %
Lymphs Abs: 5.9 10*3/uL — ABNORMAL HIGH (ref 0.7–4.0)
MCH: 31.8 pg (ref 26.0–34.0)
MCHC: 32.6 g/dL (ref 30.0–36.0)
MCV: 97.3 fL (ref 78.0–100.0)
Monocytes Absolute: 1.5 10*3/uL — ABNORMAL HIGH (ref 0.1–1.0)
Monocytes Relative: 8 %
Neutro Abs: 10.9 10*3/uL — ABNORMAL HIGH (ref 1.7–7.7)
Neutrophils Relative %: 59 %
Platelets: 248 10*3/uL (ref 150–400)
RBC: 4.47 MIL/uL (ref 3.87–5.11)
RDW: 14.4 % (ref 11.5–15.5)
WBC: 18.5 10*3/uL — ABNORMAL HIGH (ref 4.0–10.5)

## 2017-09-28 LAB — BASIC METABOLIC PANEL
Anion gap: 13 (ref 5–15)
BUN: 20 mg/dL (ref 6–20)
CO2: 26 mmol/L (ref 22–32)
Calcium: 9.2 mg/dL (ref 8.9–10.3)
Chloride: 100 mmol/L (ref 98–111)
Creatinine, Ser: 1.62 mg/dL — ABNORMAL HIGH (ref 0.44–1.00)
GFR calc Af Amer: 40 mL/min — ABNORMAL LOW (ref >60)
GFR calc non Af Amer: 34 mL/min — ABNORMAL LOW (ref >60)
Glucose, Bld: 132 mg/dL — ABNORMAL HIGH (ref 70–99)
Potassium: 3.8 mmol/L (ref 3.5–5.1)
Sodium: 139 mmol/L (ref 135–145)

## 2017-09-28 LAB — BRAIN NATRIURETIC PEPTIDE: B Natriuretic Peptide: 113.2 pg/mL — ABNORMAL HIGH (ref 0.0–100.0)

## 2017-09-28 MED ORDER — IPRATROPIUM BROMIDE 0.02 % IN SOLN
0.5000 mg | Freq: Once | RESPIRATORY_TRACT | Status: AC
  Administered 2017-09-28: 0.5 mg via RESPIRATORY_TRACT

## 2017-09-28 MED ORDER — LEVOFLOXACIN IN D5W 750 MG/150ML IV SOLN
750.0000 mg | Freq: Once | INTRAVENOUS | Status: AC
  Administered 2017-09-28: 750 mg via INTRAVENOUS
  Filled 2017-09-28: qty 150

## 2017-09-28 MED ORDER — ALBUTEROL (5 MG/ML) CONTINUOUS INHALATION SOLN
15.0000 mg/h | INHALATION_SOLUTION | RESPIRATORY_TRACT | Status: DC
  Administered 2017-09-28: 15 mg/h via RESPIRATORY_TRACT
  Filled 2017-09-28: qty 20

## 2017-09-28 MED ORDER — ALBUTEROL SULFATE (2.5 MG/3ML) 0.083% IN NEBU
2.5000 mg | INHALATION_SOLUTION | Freq: Once | RESPIRATORY_TRACT | Status: AC
  Administered 2017-09-28: 2.5 mg via RESPIRATORY_TRACT

## 2017-09-28 NOTE — H&P (Signed)
[facility] Admission History and Physical Service Pager: [phone]  Patient name: Molly Dillon Medical record number: [id number] Date of birth: Jul 03, 1960 Age: 57 y.o. Gender: female  Primary Care Provider: Hauser Bing, DO Consultants: None Code Status: Full code  Chief Complaint: Productive cough, SOB, fever x2 days  Assessment and Plan: Molly Dillon is a 57 y.o. female presenting with shortness of breath. PMH is significant for HIV, hyperlipidemia, hypertension, tobacco abuse, COPD with asthma, MDD, CKD stage III, chronic pain  #Productive cough, shortness of breath x2 days Differential diagnosis includes pneumonia, COPD exacerbation, PE. Infection remains high on differential as patient has increased white count in the setting of HIV. Patient denies any recent upper respiratory illness or sick contacts.  She reports that she has had some congestion and headaches but no sinus tenderness, sore throat. She has yellowish-brown sputum actively. Patient meets SIRS criteria.She reports hemoptysis earlier today with some larger clumps and then some streaking which has now resolved. Wells score is 3 and lower suspicion for active PE as patient's breathing status has improved greatly in last several hours. Patient reports never having COPD exacerbation previously but has continued to improve on duonebs, solumedrol and O2 therapy. But patient did not experience relief prior to admission with albuterol at home. Patient reports being on recent abx for foot surgery within last 3 months, list includes clindamycin, levo, and doxycycline. CXR shows emphysematous hyperinflation with upper lobe predominance w/ lower lobe interstitial markings and subsegmental atelectasia. Special considerations: As pneumonia has high rate of mortality in HIV + patients even with those with >500 CD4 count, sputum culture, gram stain should be collected. This patient has additional risk factors for  pna as she has underlying lung disease, COPD/Asthma, and current smoker. Of note, patient has history of PCP pna in 2002.  If pneumonia, would likely be community acquired; however, patient has risk factor for MDR organisms. In this setting, recommendations for combination therapy (zosyn OR imipenem OR meropenem OR cefepime OR ceftazidime PLUS levofloxacin) for a minimum of five days total.   Order CD4 count for transient depression in setting of infection   F/U sputum gram stain with reflex culture  Order Lactic acid as patient meets SIRS criteria   Solumedrol x 1 in EMS. Start Prednisone 40mg  in AM x 4 days. Duonebs q4 hours with albuterol neb q2 hours PRN. On home Advair, will start Dulera while in patient.    Levaquin x1 in ED. Order Levaquin q24hrs x five days total. (9/18-9/22) as patient does not have increased risk for Tb infection.   Expectorant gram stain, acid-fast bacilli smear, and bacterial and mycobacterial culture  Consider CTA chest to rule out PE if patient does not continue to improve.   Can also consider CT chest to better visualize lungs.   Can consider ID consult for antibiotic coverage if worsening symptoms  Daily AM CBC  Monitor vitals per floor, patient is tachypneic, tachycardic.   Recommend flu vaccine if not already received.   #COPD with asthma Albuterol and Advair Diskus daily, current smoker.   Advair not on formulary, will sub Dulera while in patient.   Exacerbation meds as above.    #HIV, stable Diagnosed in 1993. Last CD4 count was 710 on 09/15/2017 and remained >500 since 2017.   Continue home Entecavir-emtricitabine-tenofovir, 50-200-25  CD4 count   #Hypertension, chronic Blood pressures in 110's/60's on average. MAPS in 70's.   Continue home amlodipine 10 mg  Continue to monitor   #  Hyperlipidemia, chronic Last lipid profile LDL 104, TG 263. ASCVD risk is 5.9%.  Continue home pravastatin 20 mg.  Did not repeat lipid panel. Will  leave to day team.   #MDD, stable  Appears to be in good spirits today on exam.  Continue home Wellbutrin 150 mg.  Continue to monitor mood   #Tobacco abuse Patient continues to smoke cigarettes though has decreased the amount per day and is now smoking once per day.    Continue home Chantix.  Patient would benefit from further counseling as smoking in the setting of HIV doubles risk of pulmonary infections and can also decrease his CD4 counts and decrease immune system  #CKD, stable GFR 40 today. 49 (09/15/17), 51 (2018). Steady decline since 2015.Cr 1.62, baseline average 1.5 since 2015.   Avoid nephrotoxic agents   AM BMP  #Chronic pain, chronic At home, Cymbalta 60 mg, Lyrica 75 mg, Voltaren gel. No complaints today.   Continue cymbalta and lyrica   #Eczema, chronic, stable No complaints today.   Kenalog PRN   F: none E: replete PRN N: heart heatlhy GI ppx: Mirilax DVT Prophylaxis: Lovenox  Disposition: Admit to Tele  History of the Present Illness  Molly Dillon is a 57 y.o. female with past medical history significant for HIV, currently on ART, with most recent viral load undetectable, who presents with shortness of breath, productive cough with fevers x2 days that has been worsening. The productive cough is a milky yellowish-green-brown sputum. She reports cough earlier today with some blood clots and then streaks of blood that have resolved now. Patient denies any sick contacts. She is currently not working and lives at home in [place] on the other side of [place]. She reports having some seasonal allergies sometimes. Patient has been using her an albuterol inhaler but without relief at home. Associated chest pain with coughing, but denies chest pain with exertion or at rest. She also endorses headaches. She denies fevers or chills.   In the ED, CMP was significant for creatinine of 1.62, baseline around 1.5, CBC was significant for white count of 18.5 with  increased neutrophils and lymphocytes.  Chest x-ray showed emphysematous changes hyperinflation and predominantly upper lobe with lower lobe interstitial markings and subsegmental atelectatic aphasia.  EKG was sinus talking.  BNP was 113.2.  She was given steroids, DuoNeb's x2, X1 hour, and levofloxacin 750 mg.  She was satting in the 70s on room air and has required 3 L -she does not require oxygen at home.  Vital signs are significant for tachypnea and tachycardia.  Review Of Systems: Per HPI with the following additions:   Review of Systems  Constitutional: Negative for chills and fever.  HENT: Positive for congestion. Negative for ear pain, sinus pain and sore throat.   Eyes: Negative for blurred vision and discharge.  Respiratory: Positive for cough, hemoptysis, sputum production (green in color) and shortness of breath.        In ED, patient's sputum is dark yellow without blood.   Cardiovascular: Positive for chest pain and palpitations.  Gastrointestinal: Positive for constipation. Negative for abdominal pain, diarrhea, nausea and vomiting.  Genitourinary: Negative for dysuria.  Musculoskeletal: Negative for myalgias.  Skin: Negative for rash.  Neurological: Positive for headaches. Negative for speech change, focal weakness and loss of consciousness.  Endo/Heme/Allergies: Positive for environmental allergies.  Psychiatric/Behavioral: Negative for substance abuse.    Past Medical History: Past Medical History:  Diagnosis Date  . Acute renal insufficiency 06/06/2014  . Asthma   .  Constipation 07/19/2014  . COPD (chronic obstructive pulmonary disease) with acute bronchitis ([place]) 06/06/2014  . Depression   . Depression, major, recurrent, moderate ([place]) 04/02/2015  . HIV infection ([place])   . Hyperlipidemia   . Hypertension   . Hypertensive nephropathy 07/19/2014  . Unintentional weight loss 02/13/2015   Patient Active Problem List   Diagnosis Date Noted  . CKD (chronic kidney disease),  stage III ([place]) 02/22/2017  . Chronic pain disorder 03/22/2016  . Depression, major, recurrent, moderate ([place]) 04/02/2015  . COPD with asthma ([place]) 06/06/2014  . Tobacco use disorder 06/15/2013  . Allergic rhinitis 05/09/2009  . Mixed hyperlipidemia 05/03/2008  . Human immunodeficiency virus (HIV) disease ([place]) 01/26/2006  . Primary hypertension 01/26/2006    Past Surgical History: Bunionectomy and hammertoe repair of digits 2 through 5 and left foot 06/24/2017  Social History: Social History   Tobacco Use  . Smoking status: Former Smoker    Packs/day: 0.50    Years: 36.00    Pack years: 18.00    Types: Cigarettes    Start date: 01/13/1980  . Smokeless tobacco: Never Used  . Tobacco comment: down to 5 cigs a day  Substance Use Topics  . Alcohol use: No    Alcohol/week: 0.0 standard drinks    Frequency: Never  . Drug use: No   Additional social history: smoking 1 cigarette a day  Please also refer to relevant sections of EMR.  Family History: No family history on file.   Mom- T2DM  Father- unsure  Siblings with T2DM  Daughter has thyroid disorder  Allergies and Medications: Allergies  Allergen Reactions  . Dapsone Shortness Of Breath  . Penicillins Anaphylaxis    Has patient had a PCN reaction causing immediate rash, facial/tongue/throat swelling, SOB or lightheadedness with hypotension: Yes Has patient had a PCN reaction causing severe rash involving mucus membranes or skin necrosis: Yes Has patient had a PCN reaction that required hospitalization: No Has patient had a PCN reaction occurring within the last 10 years: No If all of the above answers are "NO", then may proceed with Cephalosporin use.    No current facility-administered medications on file prior to encounter.    Current Outpatient Medications on File Prior to Encounter  Medication Sig Dispense Refill  . albuterol (PROVENTIL HFA;VENTOLIN HFA) 108 (90 Base) MCG/ACT inhaler Inhale 2 puffs into the  lungs every 6 (six) hours as needed for wheezing. 1 Inhaler 11  . amLODipine (NORVASC) 10 MG tablet Take 1 tablet (10 mg total) by mouth daily. 30 tablet 11  . bictegravir-emtricitabine-tenofovir AF (BIKTARVY) 50-200-25 MG TABS tablet Take 1 tablet by mouth daily. 30 tablet 11  . buPROPion (WELLBUTRIN SR) 150 MG 12 hr tablet Take 1 tablet (150 mg total) by mouth daily. 30 tablet 1  . CHANTIX CONTINUING MONTH PAK 1 MG tablet TAKE 1 TABLET (1 MG TOTAL) BY MOUTH 2 (TWO) TIMES DAILY. (Patient taking differently: Take 1 mg by mouth 2 (two) times daily. ) 56 tablet 0  . diclofenac sodium (VOLTAREN) 1 % GEL Apply 2 g topically 4 (four) times daily as needed (pain).   2  . DULoxetine (CYMBALTA) 30 MG capsule TAKE 2 CAPSULES BY MOUTH DAILY (Patient taking differently: Take 60 mg by mouth daily. ) 60 capsule 2  . Fluticasone-Salmeterol (ADVAIR DISKUS) 250-50 MCG/DOSE AEPB Inhale 1 puff into the lungs 2 (two) times daily. 60 each 11  . gentamicin cream (GARAMYCIN) 0.1 % Apply 1 application topically 3 (three) times daily. 30 g  1  . nicotine (NICODERM CQ - DOSED IN MG/24 HOURS) 21 mg/24hr patch Place 1 patch (21 mg total) onto the skin daily. (Patient taking differently: Place 21 mg onto the skin daily as needed (nicotine dependence). ) 28 patch 0  . nicotine polacrilex (NICORETTE) 4 MG gum Take 1 each (4 mg total) by mouth as needed for smoking cessation. 100 tablet 0  . pravastatin (PRAVACHOL) 20 MG tablet Take 1 tablet (20 mg total) by mouth daily. Reported on 04/02/2015 30 tablet 11  . pregabalin (LYRICA) 75 MG capsule TAKE 1 CAPSULE BY MOUTH TWICE DAILY (Patient taking differently: Take 75 mg by mouth 2 (two) times daily. ) 60 capsule 2  . triamcinolone ointment (KENALOG) 0.5 % Apply 1 application topically 2 (two) times daily. 30 g 3  . clindamycin (CLEOCIN) 300 MG capsule Take 1 capsule (300 mg total) by mouth 3 (three) times daily. (Patient not taking: Reported on 09/28/2017) 30 capsule 0  . doxycycline  (VIBRA-TABS) 100 MG tablet Take 1 tablet (100 mg total) by mouth 2 (two) times daily. (Patient not taking: Reported on 09/28/2017) 20 tablet 0  . levofloxacin (LEVAQUIN) 500 MG tablet Take 1 tablet (500 mg total) by mouth daily. (Patient not taking: Reported on 09/28/2017) 14 tablet 0  . varenicline (CHANTIX STARTING MONTH PAK) 0.5 MG X 11 & 1 MG X 42 tablet 0.5 mg qdaily x3 d, then0.5 mg tab BID x 4 d, the 1 mg BID (Patient not taking: Reported on 09/28/2017) 53 tablet 0    Objective: BP (!) 108/59   Pulse (!) 117   Temp 99.5 F (37.5 C) (Oral)   Resp (!) 28   Ht 5\' 4"  (1.626 m)   Wt 74.4 kg   LMP 01/12/2011   SpO2 93%   BMI 28.15 kg/m  Exam: Gen: NAD, alert, non-toxic, well-nourished, pleasant, comfortable in bed. HEENT: Normocephaic, atraumatic. PERRLA, clear conjuctiva, no scleral icterus and injection. Normal EOM. Hearing intact. Neck supple with no LAD, nodules, or gross abnormality. Nares patent with no discharge.  Maxillary and frontal sinuses nontender to palpation. Oropharynx without erythema and lesions.  Tonsils nonswollen and without exudate.   CV: Regular rate and rhythm.  Normal S1-S2.  No murmur, gallops, rubs appreciated. Radial pulses 2+ bilaterally. No bilateral lower extremity edema. Resp: Diminished breath sounds on postierior middle and lower lobe. Better air movement on right side, but still decreased. Scattered biphasic wheezing and rhonchi throughout. No increased work of breathing appreciated. Patient with nasal cannula in only one nare.  Abd: Nontender and nondistended on palpation to all 4 quadrants.  Positive bowel sounds. Skin: No obvious rashes, lesions, or trauma with exception of left third toe which is dressed with bandaid. Lesion is ~1cm in diameter, has regular, mildly erythematous borders, nonblanching, and without active purulence.   MSK: Normal ROM.   Neuro: Cranial nerves II through VI grossly intact. Gait normal. No obvious abnormal movements. Psych:  Cooperative with exam.  Normal speech. Pleasant. Makes good eye contact. Genitourinary: deferred.    Labs and Imaging: CBC BMET  Recent Labs  Lab 09/28/17 1558  WBC 18.5*  HGB 14.2  HCT 43.5  PLT 248   Recent Labs  Lab 09/28/17 1558  NA 139  K 3.8  CL 100  CO2 26  BUN 20  CREATININE 1.62*  GLUCOSE 132*  CALCIUM 9.2     not applicable  Imaging/Diagnostic Tests: Dg Chest Portable 1 View  Result Date: 09/28/2017 CLINICAL DATA:  Productive cough and  dyspnea x2 days. EXAM: PORTABLE CHEST 1 VIEW COMPARISON:  CXR 03/27/2004 and chest CT 05/04/2017 FINDINGS: Heart is normal in size. There is minimal aortic atherosclerosis without aneurysmal dilatation. Emphysematous hyperinflation of the lungs, upper lobe predominant with crowding of lower lobe interstitial lung markings and subsegmental atelectasis and/or scarring at each base. No confluent airspace opacities to suggest pneumonia. No effusion or pneumothorax. No acute osseous abnormality. IMPRESSION: Emphysematous hyperinflation of the lungs, upper lobe predominant with resultant crowding of lower lobe interstitial markings and subsegmental atelectasis and/or scarring at each lung base. Electronically Signed   By: Ashley Royalty M.D.   On: 09/28/2017 18:36     Wilber Oliphant, MD 09/28/2017, 9:46 PM PGY-1, [place] [facility] pager: [phone], text pages welcome

## 2017-09-28 NOTE — ED Notes (Signed)
Pt tolerating continuous neb tx well.

## 2017-09-28 NOTE — ED Notes (Signed)
Left number on 5W for callback

## 2017-09-28 NOTE — Progress Notes (Signed)
  Patient Name: Taylor Spilde Date of Birth: 19-May-1960 Date of Visit: 09/28/17 PCP: [place] Bing, DO  Chief Complaint: Cough and a hard time breathing  Subjective: Hamna Asa is a pleasant 57 y.o. year old with a history of tobacco abuse, COPD, hypertension and HIV presenting today with complaints of cough and difficulty breathing.  Ms. Eckstein is alone today.  She reports a 2-day history of worsening cough.  She has chest pain when she coughs.  She reports she has been using her rescue inhaler frequently but it has not been helping her symptoms she reports worsening dyspnea.  Denies chest pain at rest or with exertion when she is not coughing, fevers at home, new exposures to allergens or irritants.  Her most recent viral load is undetectable.   ROS:  ROS Pertinent positive and negative findings above.  History limited given patient's vital signs and need for a higher level of care.  I have reviewed the patient's medical, surgical, family, and social history as appropriate.   Vitals:   09/28/17 1457 09/28/17 1508  BP: 130/72   Pulse: (!) 122   Temp: 98.4 F (36.9 C)   SpO2: (!) 76% 91%   Filed Weights   09/28/17 1457  Weight: 164 lb (74.4 kg)   Ill-appearing woman in mild distress.  Tachypnea noted.  Pursed lip breathing.  Cardiac exam is tachycardic uncertain if regular.  Lungs with diffusely diminished breath sounds bilaterally positive crackles on the right throughout the right lung field.  Lung sounds diminished no appreciable wheezing but does have a prolonged expiratory phase  Shakila was seen today for shortness of breath.  Diagnoses and all orders for this visit:  Acute Hypoxic Respiratory Failure likely due to COPD exacerbation versus pneumonia.  Patient is at risk for multidrug-resistant organisms as well as fungal infections given HIV status.  Less likely PE but this must also be considered.  Placed immediately on oxygen and given a DuoNeb in clinic.  EMS was  called.  -     albuterol (PROVENTIL) (2.5 MG/3ML) 0.083% nebulizer solution 2.5 mg -     ipratropium (ATROVENT) nebulizer solution 0.5 mg   Dorris Singh, MD  Family Medicine Teaching Service

## 2017-09-28 NOTE — ED Provider Notes (Signed)
[facility] EMERGENCY DEPARTMENT Provider Note   CSN: [id number] Arrival date & time: 09/28/17  1554     History   Chief Complaint Chief Complaint  Patient presents with  . Shortness of Breath    HPI Molly Dillon is a 57 y.o. female.  HPI   57 year old female with cough and shortness of breath.  Symptom onset 2 to 3 days ago.  Progressively worsening.  Pain subjective fever, body aches.  Cough productive.  EMS reports room air sats in the 70s.  She was given 2 DuoNeb's 125 mg of Solu-Medrol prior to arrival.  She still feels short of breath although improved.  She has underlying history of COPD but is not on supplemental oxygen at baseline.  Past Medical History:  Diagnosis Date  . Acute renal insufficiency 06/06/2014  . Asthma   . Constipation 07/19/2014  . COPD (chronic obstructive pulmonary disease) with acute bronchitis ([place]) 06/06/2014  . Depression   . Depression, major, recurrent, moderate ([place]) 04/02/2015  . HIV infection ([place])   . Hyperlipidemia   . Hypertension   . Hypertensive nephropathy 07/19/2014  . Unintentional weight loss 02/13/2015    Patient Active Problem List   Diagnosis Date Noted  . CKD (chronic kidney disease), stage III ([place]) 02/22/2017  . Chronic pain disorder 03/22/2016  . Depression, major, recurrent, moderate ([place]) 04/02/2015  . COPD with asthma ([place]) 06/06/2014  . Tobacco use disorder 06/15/2013  . Allergic rhinitis 05/09/2009  . Mixed hyperlipidemia 05/03/2008  . Human immunodeficiency virus (HIV) disease ([place]) 01/26/2006  . Primary hypertension 01/26/2006    No past surgical history on file.   OB History   None      Home Medications    Prior to Admission medications   Medication Sig Start Date End Date Taking? Authorizing Provider  albuterol (PROVENTIL HFA;VENTOLIN HFA) 108 (90 Base) MCG/ACT inhaler Inhale 2 puffs into the lungs every 6 (six) hours as needed for wheezing. 06/24/16  Yes Tommy Medal, Lavell Islam, MD    amLODipine (NORVASC) 10 MG tablet Take 1 tablet (10 mg total) by mouth daily. 01/18/17  Yes Tommy Medal, Lavell Islam, MD  bictegravir-emtricitabine-tenofovir AF (BIKTARVY) 50-200-25 MG TABS tablet Take 1 tablet by mouth daily. 06/21/17  Yes Tommy Medal, Lavell Islam, MD  buPROPion [facility] SR) 150 MG 12 hr tablet Take 1 tablet (150 mg total) by mouth daily. 04/02/16  Yes Hensel, Jamal Collin, MD  CHANTIX CONTINUING MONTH PAK 1 MG tablet TAKE 1 TABLET (1 MG TOTAL) BY MOUTH 2 (TWO) TIMES DAILY. Patient taking differently: Take 1 mg by mouth 2 (two) times daily.  09/03/17  Yes Tommy Medal, Lavell Islam, MD  diclofenac sodium (VOLTAREN) 1 % GEL Apply 2 g topically 4 (four) times daily as needed (pain).  07/30/17  Yes [provider]  DULoxetine (CYMBALTA) 30 MG capsule TAKE 2 CAPSULES BY MOUTH DAILY Patient taking differently: Take 60 mg by mouth daily.  09/03/17  Yes [place] Bing, DO  Fluticasone-Salmeterol (ADVAIR DISKUS) 250-50 MCG/DOSE AEPB Inhale 1 puff into the lungs 2 (two) times daily. 01/18/17  Yes Tommy Medal, Lavell Islam, MD  gentamicin cream (GARAMYCIN) 0.1 % Apply 1 application topically 3 (three) times daily. 08/18/17  Yes Edrick Kins, DPM  nicotine (NICODERM CQ - DOSED IN MG/24 HOURS) 21 mg/24hr patch Place 1 patch (21 mg total) onto the skin daily. Patient taking differently: Place 21 mg onto the skin daily as needed (nicotine dependence).  05/10/17  Yes [place] Bing,  DO  nicotine polacrilex (NICORETTE) 4 MG gum Take 1 each (4 mg total) by mouth as needed for smoking cessation. 05/10/17  Yes [place] Bing, DO  pravastatin (PRAVACHOL) 20 MG tablet Take 1 tablet (20 mg total) by mouth daily. Reported on 04/02/2015 01/18/17  Yes Tommy Medal, Lavell Islam, MD  pregabalin (LYRICA) 75 MG capsule TAKE 1 CAPSULE BY MOUTH TWICE DAILY Patient taking differently: Take 75 mg by mouth 2 (two) times daily.  09/03/17  Yes [place] Bing, DO  triamcinolone ointment (KENALOG) 0.5 % Apply 1 application topically 2  (two) times daily. 05/28/16  Yes [place] Bing, DO  clindamycin (CLEOCIN) 300 MG capsule Take 1 capsule (300 mg total) by mouth 3 (three) times daily. Patient not taking: Reported on 09/28/2017 08/18/17   Edrick Kins, DPM  doxycycline (VIBRA-TABS) 100 MG tablet Take 1 tablet (100 mg total) by mouth 2 (two) times daily. Patient not taking: Reported on 09/28/2017 06/30/17   Edrick Kins, DPM  levofloxacin (LEVAQUIN) 500 MG tablet Take 1 tablet (500 mg total) by mouth daily. Patient not taking: Reported on 09/28/2017 08/17/17   Edrick Kins, DPM  varenicline (CHANTIX STARTING MONTH PAK) 0.5 MG X 11 & 1 MG X 42 tablet 0.5 mg qdaily x3 d, then0.5 mg tab BID x 4 d, the 1 mg BID Patient not taking: Reported on 09/28/2017 06/21/17   Tommy Medal, Lavell Islam, MD    Family History No family history on file.  Social History Social History   Tobacco Use  . Smoking status: Former Smoker    Packs/day: 0.50    Years: 36.00    Pack years: 18.00    Types: Cigarettes    Start date: 01/13/1980  . Smokeless tobacco: Never Used  . Tobacco comment: down to 5 cigs a day  Substance Use Topics  . Alcohol use: No    Alcohol/week: 0.0 standard drinks    Frequency: Never  . Drug use: No     Allergies   Dapsone and Penicillins   Review of Systems Review of Systems  All systems reviewed and negative, other than as noted in HPI.  Physical Exam Updated Vital Signs BP (!) 109/58   Pulse (!) 118   Temp 99.5 F (37.5 C) (Oral)   Resp (!) 30   Ht 5\' 4"  (1.626 m)   Wt 74.4 kg   LMP 01/12/2011   SpO2 94%   BMI 28.15 kg/m   Physical Exam  Constitutional: She appears well-developed. She appears distressed.  HENT:  Head: Normocephalic and atraumatic.  Eyes: Conjunctivae are normal. Right eye exhibits no discharge. Left eye exhibits no discharge.  Neck: Neck supple.  Cardiovascular: Regular rhythm and normal heart sounds. Exam reveals no gallop and no friction rub.  No murmur heard. Tachycardic    Pulmonary/Chest: She is in respiratory distress.  Tachypnea.  Some accessory muscle usage.  Diffuse wheezing bilaterally.  Abdominal: Soft. She exhibits no distension. There is no tenderness.  Musculoskeletal: She exhibits no edema or tenderness.  Neurological: She is alert.  Skin: Skin is warm and dry.  Psychiatric: She has a normal mood and affect. Her behavior is normal. Thought content normal.  Nursing note and vitals reviewed.    ED Treatments / Results  Labs (all labs ordered are listed, but only abnormal results are displayed) Labs Reviewed  CBC WITH DIFFERENTIAL/PLATELET - Abnormal; Notable for the following components:      Result Value   WBC 18.5 (*)  Neutro Abs 10.9 (*)    Lymphs Abs 5.9 (*)    Monocytes Absolute 1.5 (*)    All other components within normal limits  BASIC METABOLIC PANEL - Abnormal; Notable for the following components:   Glucose, Bld 132 (*)    Creatinine, Ser 1.62 (*)    GFR calc non Af Amer 34 (*)    GFR calc Af Amer 40 (*)    All other components within normal limits  BRAIN NATRIURETIC PEPTIDE - Abnormal; Notable for the following components:   B Natriuretic Peptide 113.2 (*)    All other components within normal limits  BASIC METABOLIC PANEL  CBC    EKG EKG Interpretation  Date/Time:  Tuesday September 28 2017 16:19:39 EDT Ventricular Rate:  118 PR Interval:    QRS Duration: 93 QT Interval:  344 QTC Calculation: 482 R Axis:   71 Text Interpretation:  Sinus tachycardia Confirmed by Virgel Manifold [id number]) on 09/28/2017 5:14:39 PM   Radiology Dg Chest Portable 1 View  Result Date: 09/28/2017 CLINICAL DATA:  Productive cough and dyspnea x2 days. EXAM: PORTABLE CHEST 1 VIEW COMPARISON:  CXR 03/27/2004 and chest CT 05/04/2017 FINDINGS: Heart is normal in size. There is minimal aortic atherosclerosis without aneurysmal dilatation. Emphysematous hyperinflation of the lungs, upper lobe predominant with crowding of lower lobe interstitial  lung markings and subsegmental atelectasis and/or scarring at each base. No confluent airspace opacities to suggest pneumonia. No effusion or pneumothorax. No acute osseous abnormality. IMPRESSION: Emphysematous hyperinflation of the lungs, upper lobe predominant with resultant crowding of lower lobe interstitial markings and subsegmental atelectasis and/or scarring at each lung base. Electronically Signed   By: Ashley Royalty M.D.   On: 09/28/2017 18:36    Procedures Procedures (including critical care time)  Medications Ordered in ED Medications  albuterol (PROVENTIL,VENTOLIN) solution continuous neb (15 mg/hr Nebulization New Bag/Given 09/28/17 1640)  levofloxacin (LEVAQUIN) IVPB 750 mg (0 mg Intravenous Stopped 09/28/17 2021)     Initial Impression / Assessment and Plan / ED Course  I have reviewed the triage vital signs and the nursing notes.  Pertinent labs & imaging results that were available during my care of the patient were reviewed by me and considered in my medical decision making (see chart for details).     57 year old female with what may be a COPD exacerbation.  Diffuse wheezing on exam.  Improved with nebs but she still has an oxygen requirement.  She received steroids prehospital.  She is complaining of body aches, chills and has leukocytosis.  Steroids prehospital likely not reflected this soon.  Clinically it sounds like she may have pneumonia.  Levaquin was ordered.  She has penicillin allergy.  Will admit for ongoing treatment.  Final Clinical Impressions(s) / ED Diagnoses   Final diagnoses:  Acute respiratory failure with hypoxia [facility])    ED Discharge Orders    None       Virgel Manifold, MD 09/29/17 [phone]

## 2017-09-28 NOTE — ED Notes (Signed)
Portable x-ray at the bedside.  

## 2017-09-28 NOTE — ED Triage Notes (Signed)
Pt here from doctors office for evaluation of productive cough with shortness of breath x2 days. Reports chills and body aches x2 days as well. Given 2 duonebs (albuterol/atrovent) and 125 solumedrol. RA sats 70s for EMS. Pt audibly wheezing at this time.

## 2017-09-29 ENCOUNTER — Encounter (HOSPITAL_COMMUNITY): Payer: Self-pay

## 2017-09-29 ENCOUNTER — Ambulatory Visit: Payer: Medicare HMO | Admitting: Infectious Disease

## 2017-09-29 DIAGNOSIS — B20 Human immunodeficiency virus [HIV] disease: Secondary | ICD-10-CM

## 2017-09-29 DIAGNOSIS — J9601 Acute respiratory failure with hypoxia: Secondary | ICD-10-CM

## 2017-09-29 DIAGNOSIS — N179 Acute kidney failure, unspecified: Secondary | ICD-10-CM

## 2017-09-29 LAB — BASIC METABOLIC PANEL
Anion gap: 15 (ref 5–15)
BUN: 27 mg/dL — AB (ref 6–20)
CALCIUM: 9 mg/dL (ref 8.9–10.3)
CHLORIDE: 99 mmol/L (ref 98–111)
CO2: 24 mmol/L (ref 22–32)
Creatinine, Ser: 1.91 mg/dL — ABNORMAL HIGH (ref 0.44–1.00)
GFR, EST AFRICAN AMERICAN: 33 mL/min — AB (ref >60)
GFR, EST NON AFRICAN AMERICAN: 28 mL/min — AB (ref >60)
Glucose, Bld: 143 mg/dL — ABNORMAL HIGH (ref 70–99)
Potassium: 4.3 mmol/L (ref 3.5–5.1)
SODIUM: 138 mmol/L (ref 135–145)

## 2017-09-29 LAB — CBC
HCT: 39.2 % (ref 36.0–46.0)
Hemoglobin: 12.6 g/dL (ref 12.0–15.0)
MCH: 30.9 pg (ref 26.0–34.0)
MCHC: 32.1 g/dL (ref 30.0–36.0)
MCV: 96.1 fL (ref 78.0–100.0)
PLATELETS: 236 10*3/uL (ref 150–400)
RBC: 4.08 MIL/uL (ref 3.87–5.11)
RDW: 14.2 % (ref 11.5–15.5)
WBC: 14.4 10*3/uL — AB (ref 4.0–10.5)

## 2017-09-29 MED ORDER — BUPROPION HCL ER (SR) 150 MG PO TB12
150.0000 mg | ORAL_TABLET | Freq: Every day | ORAL | Status: DC
  Administered 2017-09-29 – 2017-10-01 (×3): 150 mg via ORAL
  Filled 2017-09-29 (×3): qty 1

## 2017-09-29 MED ORDER — ACETAMINOPHEN 650 MG RE SUPP: 650.0000 mg | Freq: Four times a day (QID) | RECTAL | Status: DC | PRN

## 2017-09-29 MED ORDER — GENTAMICIN SULFATE 0.1 % EX CREA
1.0000 "application " | TOPICAL_CREAM | Freq: Three times a day (TID) | CUTANEOUS | Status: DC
  Administered 2017-09-29 – 2017-10-01 (×4): 1 via TOPICAL
  Filled 2017-09-29 (×2): qty 15

## 2017-09-29 MED ORDER — BICTEGRAVIR-EMTRICITAB-TENOFOV 50-200-25 MG PO TABS
1.0000 | ORAL_TABLET | Freq: Every day | ORAL | Status: DC
  Administered 2017-09-29 – 2017-10-01 (×3): 1 via ORAL
  Filled 2017-09-29 (×3): qty 1

## 2017-09-29 MED ORDER — VARENICLINE TARTRATE 1 MG PO TABS
1.0000 mg | ORAL_TABLET | Freq: Two times a day (BID) | ORAL | Status: DC
  Administered 2017-09-29 – 2017-10-01 (×5): 1 mg via ORAL
  Filled 2017-09-29 (×5): qty 1

## 2017-09-29 MED ORDER — PREGABALIN 75 MG PO CAPS
75.0000 mg | ORAL_CAPSULE | Freq: Two times a day (BID) | ORAL | Status: DC
  Administered 2017-09-29 – 2017-10-01 (×6): 75 mg via ORAL
  Filled 2017-09-29 (×6): qty 1

## 2017-09-29 MED ORDER — IPRATROPIUM-ALBUTEROL 0.5-2.5 (3) MG/3ML IN SOLN: 3.0000 mL | RESPIRATORY_TRACT | Status: DC

## 2017-09-29 MED ORDER — PREDNISONE 20 MG PO TABS
40.0000 mg | ORAL_TABLET | Freq: Every day | ORAL | Status: DC
  Administered 2017-09-29 – 2017-10-01 (×3): 40 mg via ORAL
  Filled 2017-09-29 (×3): qty 2

## 2017-09-29 MED ORDER — DULOXETINE HCL 60 MG PO CPEP
60.0000 mg | ORAL_CAPSULE | Freq: Every day | ORAL | Status: DC
  Administered 2017-09-29 – 2017-10-01 (×3): 60 mg via ORAL
  Filled 2017-09-29 (×3): qty 1

## 2017-09-29 MED ORDER — IPRATROPIUM-ALBUTEROL 0.5-2.5 (3) MG/3ML IN SOLN
3.0000 mL | Freq: Three times a day (TID) | RESPIRATORY_TRACT | Status: DC
  Administered 2017-09-29 – 2017-10-01 (×7): 3 mL via RESPIRATORY_TRACT
  Filled 2017-09-29 (×7): qty 3

## 2017-09-29 MED ORDER — SODIUM CHLORIDE 0.9 % IV SOLN
INTRAVENOUS | Status: DC
  Administered 2017-09-29: 02:00:00 via INTRAVENOUS

## 2017-09-29 MED ORDER — ALBUTEROL SULFATE (2.5 MG/3ML) 0.083% IN NEBU: 2.5000 mg | INHALATION_SOLUTION | RESPIRATORY_TRACT | Status: DC | PRN

## 2017-09-29 MED ORDER — ACETAMINOPHEN 325 MG PO TABS: 650.0000 mg | ORAL_TABLET | Freq: Four times a day (QID) | ORAL | Status: DC | PRN

## 2017-09-29 MED ORDER — TRIAMCINOLONE ACETONIDE 0.5 % EX OINT
1.0000 "application " | TOPICAL_OINTMENT | Freq: Two times a day (BID) | CUTANEOUS | Status: DC
  Administered 2017-09-29 – 2017-10-01 (×3): 1 via TOPICAL
  Filled 2017-09-29 (×2): qty 15

## 2017-09-29 MED ORDER — POLYETHYLENE GLYCOL 3350 17 G PO PACK: 17.0000 g | PACK | Freq: Every day | ORAL | Status: DC | PRN

## 2017-09-29 MED ORDER — ENOXAPARIN SODIUM 40 MG/0.4ML ~~LOC~~ SOLN
40.0000 mg | SUBCUTANEOUS | Status: DC
  Administered 2017-09-29 – 2017-10-01 (×3): 40 mg via SUBCUTANEOUS
  Filled 2017-09-29 (×3): qty 0.4

## 2017-09-29 MED ORDER — PRAVASTATIN SODIUM 20 MG PO TABS
20.0000 mg | ORAL_TABLET | Freq: Every day | ORAL | Status: DC
  Administered 2017-09-29 – 2017-10-01 (×3): 20 mg via ORAL
  Filled 2017-09-29 (×3): qty 1

## 2017-09-29 MED ORDER — MOMETASONE FURO-FORMOTEROL FUM 200-5 MCG/ACT IN AERO
2.0000 | INHALATION_SPRAY | Freq: Two times a day (BID) | RESPIRATORY_TRACT | Status: DC
  Administered 2017-09-29 – 2017-10-01 (×5): 2 via RESPIRATORY_TRACT
  Filled 2017-09-29: qty 8.8

## 2017-09-29 MED ORDER — LEVOFLOXACIN IN D5W 750 MG/150ML IV SOLN: 750.0000 mg | INTRAVENOUS | Status: DC

## 2017-09-29 MED ORDER — AMLODIPINE BESYLATE 10 MG PO TABS
10.0000 mg | ORAL_TABLET | Freq: Every day | ORAL | Status: DC
  Administered 2017-09-29 – 2017-10-01 (×3): 10 mg via ORAL
  Filled 2017-09-29 (×3): qty 1

## 2017-09-29 MED ORDER — NICOTINE 21 MG/24HR TD PT24: 21.0000 mg | MEDICATED_PATCH | Freq: Every day | TRANSDERMAL | Status: DC | PRN

## 2017-09-29 NOTE — Discharge Summary (Signed)
[facility] Discharge Summary  Patient name: Molly Dillon Medical record number: [id number] Date of birth: 06/17/1960 Age: 57 y.o. Gender: female Date of Admission: 09/28/2017  Date of Discharge: 10/02/2017 Admitting Physician: Kinnie Feil, MD  Primary Care Provider: [place] Bing, DO Consultants: none  Indication for Hospitalization: COPD exacerbation  Discharge Diagnoses/Problem List:  COPD exacerbation  Disposition: home  Discharge Condition: improved, stable.   Discharge Exam:  Gen: NAD, alert, non-toxic, well-nourished, well-appearing, sitting comfortably  HEENT: Normocephaic, atraumatic. Clear conjuctiva, no scleral icterus and injection.  CV: Regular rate and rhythm.  Normal S1-S2.   No bilateral lower extremity edema. Resp: some mild wheezing heard on deep inhalation/expiration.  Abd: Nontender and nondistended on palpation to all 4 quadrants.  Positive bowel sounds. Psych: Cooperative with exam. Pleasant. Makes eye contact. Extremities: Full ROM   Brief Hospital Course:  Patient presented with dyspnea on the evening of 9/17. She was started on prednisone, given duo-nebs, and continued on her home treatment. Levaquin was given out of concern for infection due to her HIV status. Patient improved rapidly with this treatment and was able to be discharged after proving she could ambulate without the need for supplemental oxygen. Sent home with Prednisone and levaquin.  Course should be completed by the time of follow up appointment.   Issues for Follow Up:  1. Patient sent home with new COPD medication, spiriva. Ensure patient is taking this new medication properly.  2. Patient has HIV with reduced CD4 count on this admission. Needs to follow up with Dr. Tommy Medal regarding treatment plans 3. Patient is a smoker, trying to quit.  Uses chantix at home. Counsel patient on smoking cessation.   Significant Procedures: none  Significant Labs and  Imaging:  Recent Labs  Lab 09/28/17 1558 09/29/17 0319  WBC 18.5* 14.4*  HGB 14.2 12.6  HCT 43.5 39.2  PLT 248 236   Recent Labs  Lab 09/28/17 1558 09/29/17 0319 09/30/17 0413 10/01/17 0436  NA 139 138 140 140  K 3.8 4.3 4.4 4.2  CL 100 99 104 101  CO2 26 24 25 27   GLUCOSE 132* 143* 90 88  BUN 20 27* 35* 34*  CREATININE 1.62* 1.91* 1.65* 1.63*  CALCIUM 9.2 9.0 9.1 9.7      Results/Tests Pending at Time of Discharge: none  Discharge Medications:  Allergies as of 10/01/2017      Reactions   Dapsone Shortness Of Breath   Penicillins Anaphylaxis   Has patient had a PCN reaction causing immediate rash, facial/tongue/throat swelling, SOB or lightheadedness with hypotension: Yes Has patient had a PCN reaction causing severe rash involving mucus membranes or skin necrosis: Yes Has patient had a PCN reaction that required hospitalization: No Has patient had a PCN reaction occurring within the last 10 years: No If all of the above answers are "NO", then may proceed with Cephalosporin use.      Medication List    STOP taking these medications   clindamycin 300 MG capsule Commonly known as:  CLEOCIN   doxycycline 100 MG tablet Commonly known as:  VIBRA-TABS     TAKE these medications   albuterol 108 (90 Base) MCG/ACT inhaler Commonly known as:  PROVENTIL HFA;VENTOLIN HFA Inhale 2 puffs into the lungs every 6 (six) hours as needed for wheezing.   amLODipine 10 MG tablet Commonly known as:  NORVASC Take 1 tablet (10 mg total) by mouth daily.   bictegravir-emtricitabine-tenofovir AF 50-200-25 MG Tabs tablet Commonly known  as:  BIKTARVY Take 1 tablet by mouth daily.   buPROPion 150 MG 12 hr tablet Commonly known as:  WELLBUTRIN SR Take 1 tablet (150 mg total) by mouth daily.   CHANTIX CONTINUING MONTH PAK 1 MG tablet Generic drug:  varenicline TAKE 1 TABLET (1 MG TOTAL) BY MOUTH 2 (TWO) TIMES DAILY. What changed:    See the new instructions.  Another  medication with the same name was removed. Continue taking this medication, and follow the directions you see here.   diclofenac sodium 1 % Gel Commonly known as:  VOLTAREN Apply 2 g topically 4 (four) times daily as needed (pain).   DULoxetine 30 MG capsule Commonly known as:  CYMBALTA TAKE 2 CAPSULES BY MOUTH DAILY   Fluticasone-Salmeterol 250-50 MCG/DOSE Aepb Commonly known as:  ADVAIR Inhale 1 puff into the lungs 2 (two) times daily.   gentamicin cream 0.1 % Commonly known as:  GARAMYCIN Apply 1 application topically 3 (three) times daily.   levofloxacin 750 MG tablet Commonly known as:  LEVAQUIN Take 1 tablet on 10/02/17. What changed:    medication strength  how much to take  how to take this  when to take this  additional instructions   nicotine 21 mg/24hr patch Commonly known as:  NICODERM CQ - dosed in mg/24 hours Place 1 patch (21 mg total) onto the skin daily. What changed:    when to take this  reasons to take this   nicotine polacrilex 4 MG gum Commonly known as:  NICORETTE Take 1 each (4 mg total) by mouth as needed for smoking cessation.   pravastatin 20 MG tablet Commonly known as:  PRAVACHOL Take 1 tablet (20 mg total) by mouth daily. Reported on 04/02/2015   predniSONE 20 MG tablet Commonly known as:  DELTASONE Take these two pills w/ breakfast on 10/02/17   pregabalin 75 MG capsule Commonly known as:  LYRICA TAKE 1 CAPSULE BY MOUTH TWICE DAILY   tiotropium 18 MCG inhalation capsule Commonly known as:  SPIRIVA Place 1 capsule (18 mcg total) into inhaler and inhale daily.   triamcinolone ointment 0.5 % Commonly known as:  KENALOG Apply 1 application topically 2 (two) times daily.       Discharge Instructions: Please refer to Patient Instructions section of EMR for full details.  Patient was counseled important signs and symptoms that should prompt return to medical care, changes in medications, dietary instructions, activity  restrictions, and follow up appointments.   Follow-Up Appointments: Follow-up Information    [place] Bing, DO. Go on 10/07/2017.   Specialty:  Family Medicine Why:  Please come to the family medicine clinic on 9/26 at 8:30am for a followup appointment with your PCP, Dr. Yisroel Ramming.  Please arrive 15 minutes early for check in.   Contact information: [place] [address] [phone]        Tommy Medal, Lavell Islam, MD .   Specialty:  Infectious Diseases Contact information: [address] [address] [place] Follow up.   Why:  home oxygen arranged, portable tank will be delivered prior to discharge Contact information: [address] [place] [address] [phone]           Benay Pike, MD 10/02/2017, 6:18 PM PGY-1, [place]

## 2017-09-29 NOTE — Plan of Care (Signed)
POC reviewed with patient, no acute events throughout shift. NS continued as ordered. Pt currently on 3L. Showing no signs of distress. RN to report off to oncoming Rn.

## 2017-09-29 NOTE — Progress Notes (Signed)
Family Medicine Teaching Service Daily Progress Note Intern Pager: [phone]  Patient name: Molly Dillon Medical record number: [id number] Date of birth: 17-Oct-1960 Age: 57 y.o. Gender: female  Primary Care Provider: [place] Bing, DO Consultants: none Code Status: Full code  Pt Overview and Major Events to Date:  Hospital Day 1 Admitted: 09/28/2017   Assessment and Plan: Molly Dillon is a 57 y.o. female who presented with SOB concerning for COPD exacerbation. PMHx includes HIV, COPD w/ asthma, MDDD, CKD-III, chronic pain, HLD, HTN,   #dyspnea, productive cough - likely from COPD exacerbation given her history and her responsiveness to standard COPD therapy.  Pt reports hemoptysis, congestion, HA.  Wells score 3. No previous exacerbations reported. Improved with duonebs, solumedrol, O2. CXR shows emphaysematous hyperinflatmion. Hx of PCP in 2002.  DDx: COPD vs infection (wbc 14.4) vs. PE - prednisone 40mg  x 5d - duonebs TID - dulera. Albuterol prn.  Consider increasing home COPD treatment.   - collect sputum cx and gram stain - levaquin (9/18-9/22) - expectorant gram stain, AFB smear, bacterial/mycobacterial Cx - CD4 count - sputum gram stain  #COPD w/ asthma Albuterol and advair diskus daily. Current smoker - dulera substituted for advair  #HIV, stable Diagnosed 1993. CD4 710 on 09/15/2017.  - continue home entecavir-emtricitabine-tenofovir, 50-200-25  #Hypertension, chronic Blood pressures in 110's/60's on average. MAPS in 70's.   Continue home amlodipine 10 mg  Continue to monitor   #Hyperlipidemia, chronic Last lipid profile LDL 104, TG 263. ASCVD risk is 5.9%.  Continue home pravastatin 20 mg.  Did not repeat lipid panel. Will leave to day team.   #MDD, stable  Appears to be in good spirits today on exam.  Continue home Wellbutrin 150 mg.  Continue to monitor mood   #Tobacco abuse Patient continues to smoke cigarettes though has decreased the amount  per day and is now smoking once per day.    Continue home Chantix.  Patient would benefit from further counseling as smoking in the setting of HIV doubles risk of pulmonary infections and can also decrease his CD4 counts and decrease immune system  #CKD, stable GFR 40 today. 49 (09/15/17), 51 (2018). Steady decline since 2015.Cr 1.62, baseline average 1.5 since 2015.   Avoid nephrotoxic agents   AM BMP  #Chronic pain, chronic At home, Cymbalta 60 mg, Lyrica 75 mg, Voltaren gel. No complaints today.   Continue cymbalta and lyrica   #Eczema, chronic, stable No complaints today.   Kenalog PRN    Fluids: none . sodium chloride 100 mL/hr at 09/29/17 0215  . [START ON 09/30/2017] levofloxacin (LEVAQUIN) IV     Electrolytes: replete PRN  Nutrition: heart healthy GI ppx: miralax DVT px: lovenox  Disposition: home   Medications: Scheduled Meds: . amLODipine  10 mg Oral Daily  . bictegravir-emtricitabine-tenofovir AF  1 tablet Oral Daily  . buPROPion  150 mg Oral Daily  . DULoxetine  60 mg Oral Daily  . enoxaparin (LOVENOX) injection  40 mg Subcutaneous Q24H  . gentamicin cream  1 application Topical TID  . ipratropium-albuterol  3 mL Nebulization TID  . mometasone-formoterol  2 puff Inhalation BID  . pravastatin  20 mg Oral Daily  . predniSONE  40 mg Oral Q breakfast  . pregabalin  75 mg Oral BID  . triamcinolone ointment  1 application Topical BID  . varenicline  1 mg Oral BID   Continuous Infusions: . sodium chloride 100 mL/hr at 09/29/17 0215  . [START ON 09/30/2017] levofloxacin (LEVAQUIN) IV  PRN Meds: acetaminophen **OR** acetaminophen, albuterol, nicotine, polyethylene glycol  ================================================= ================================================= Subjective:  Patient states she is much improved since her initial exacerbation yesterday.  She feels likes she is 90% of the way back to her baseline.  This was the first time she had  ever had a dypsnea episode like this.  She has had COPD for a long time, several years.    Objective: Temp:  [97.7 F (36.5 C)-99.5 F (37.5 C)] 97.7 F (36.5 C) (09/18 0550) Pulse Rate:  [67-122] 96 (09/18 0550) Resp:  [20-31] 25 (09/17 2315) BP: (104-139)/(57-73) 139/68 (09/18 0550) SpO2:  [70 %-100 %] 100 % (09/18 0550) Weight:  [74.4 kg] 74.4 kg (09/17 1609) Intake/Output No intake/output data recorded. Physical Exam:  Gen: NAD, alert, non-toxic, well-nourished, well-appearing, sitting comfortably in bed without her nasal cannula attached.   HEENT: Normocephaic, atraumatic. Clear conjuctiva, no scleral icterus and injection.  CV: tachycardic rate and regular rhythm.  Normal S1-S2.   Normal capillary refill bilaterally.  Radial pulses 2+ bilaterally. No bilateral lower extremity edema. Resp: diminished breath sounds. No wheezing, rales, abnormal lung sounds.  No increased work of breathing appreciated. Abd: Nontender and nondistended on palpation to all 4 quadrants.  Positive bowel sounds. Psych: Cooperative with exam. Pleasant. Makes eye contact. Extremities: Full ROM    Laboratory: Recent Labs  Lab 09/28/17 1558 09/29/17 0319  WBC 18.5* 14.4*  HGB 14.2 12.6  HCT 43.5 39.2  PLT 248 236   Recent Labs  Lab 09/28/17 1558 09/29/17 0319  NA 139 138  K 3.8 4.3  CL 100 99  CO2 26 24  BUN 20 27*  CREATININE 1.62* 1.91*  CALCIUM 9.2 9.0  GLUCOSE 132* 143*   Imaging/Diagnostic Tests: Dg Chest Portable 1 View  Result Date: 09/28/2017 CLINICAL DATA:  Productive cough and dyspnea x2 days. EXAM: PORTABLE CHEST 1 VIEW COMPARISON:  CXR 03/27/2004 and chest CT 05/04/2017 FINDINGS: Heart is normal in size. There is minimal aortic atherosclerosis without aneurysmal dilatation. Emphysematous hyperinflation of the lungs, upper lobe predominant with crowding of lower lobe interstitial lung markings and subsegmental atelectasis and/or scarring at each base. No confluent airspace  opacities to suggest pneumonia. No effusion or pneumothorax. No acute osseous abnormality. IMPRESSION: Emphysematous hyperinflation of the lungs, upper lobe predominant with resultant crowding of lower lobe interstitial markings and subsegmental atelectasis and/or scarring at each lung base. Electronically Signed   By: Ashley Royalty M.D.   On: 09/28/2017 18:36      Benay Pike, MD 09/29/2017, 6:03 AM PGY-1, [place] Intern pager: [phone], text pages welcome

## 2017-09-29 NOTE — Progress Notes (Signed)
Pt brought to 5W from Ed wihtout distress.  BS are clear/Diminished. RR normal (<20), ambulating on arrival and placed on 3LPM [place].  Pt in no distress at this time.  Based on Protocol assessment, and according to pt notes and documentation, pt has been titrated to TID duoneb treatments with PRN at Q4.  RT will manage pt and monitor as needed.

## 2017-09-29 NOTE — Progress Notes (Signed)
SATURATION QUALIFICATIONS: (This note is used to comply with regulatory documentation for home oxygen)  Patient Saturations on Room Air at Rest = 88%  Patient Saturations on Room Air while Ambulating = 87%  Patient Saturations on 3 Liters of oxygen while Ambulating = 94%  Please briefly explain why patient needs home oxygen:

## 2017-09-29 NOTE — H&P (Signed)
Resident's H&P reviewed. I am unable to cosign it since it has already been signed.   57 Y/O F with Pmx of HTN, HLD, COPD, HIV infection who was sent to the ED from her PCP's office for cough, SOB x 2 days. She endorsed sputum production, no fever, no sick contact. She feels better this morning.  No current facility-administered medications on file prior to encounter.    Current Outpatient Medications on File Prior to Encounter  Medication Sig Dispense Refill  . albuterol (PROVENTIL HFA;VENTOLIN HFA) 108 (90 Base) MCG/ACT inhaler Inhale 2 puffs into the lungs every 6 (six) hours as needed for wheezing. 1 Inhaler 11  . amLODipine (NORVASC) 10 MG tablet Take 1 tablet (10 mg total) by mouth daily. 30 tablet 11  . bictegravir-emtricitabine-tenofovir AF (BIKTARVY) 50-200-25 MG TABS tablet Take 1 tablet by mouth daily. 30 tablet 11  . buPROPion (WELLBUTRIN SR) 150 MG 12 hr tablet Take 1 tablet (150 mg total) by mouth daily. 30 tablet 1  . CHANTIX CONTINUING MONTH PAK 1 MG tablet TAKE 1 TABLET (1 MG TOTAL) BY MOUTH 2 (TWO) TIMES DAILY. (Patient taking differently: Take 1 mg by mouth 2 (two) times daily. ) 56 tablet 0  . diclofenac sodium (VOLTAREN) 1 % GEL Apply 2 g topically 4 (four) times daily as needed (pain).   2  . DULoxetine (CYMBALTA) 30 MG capsule TAKE 2 CAPSULES BY MOUTH DAILY (Patient taking differently: Take 60 mg by mouth daily. ) 60 capsule 2  . Fluticasone-Salmeterol (ADVAIR DISKUS) 250-50 MCG/DOSE AEPB Inhale 1 puff into the lungs 2 (two) times daily. 60 each 11  . gentamicin cream (GARAMYCIN) 0.1 % Apply 1 application topically 3 (three) times daily. 30 g 1  . nicotine (NICODERM CQ - DOSED IN MG/24 HOURS) 21 mg/24hr patch Place 1 patch (21 mg total) onto the skin daily. (Patient taking differently: Place 21 mg onto the skin daily as needed (nicotine dependence). ) 28 patch 0  . nicotine polacrilex (NICORETTE) 4 MG gum Take 1 each (4 mg total) by mouth as needed for smoking cessation. 100  tablet 0  . pravastatin (PRAVACHOL) 20 MG tablet Take 1 tablet (20 mg total) by mouth daily. Reported on 04/02/2015 30 tablet 11  . pregabalin (LYRICA) 75 MG capsule TAKE 1 CAPSULE BY MOUTH TWICE DAILY (Patient taking differently: Take 75 mg by mouth 2 (two) times daily. ) 60 capsule 2  . triamcinolone ointment (KENALOG) 0.5 % Apply 1 application topically 2 (two) times daily. 30 g 3  . clindamycin (CLEOCIN) 300 MG capsule Take 1 capsule (300 mg total) by mouth 3 (three) times daily. (Patient not taking: Reported on 09/28/2017) 30 capsule 0  . doxycycline (VIBRA-TABS) 100 MG tablet Take 1 tablet (100 mg total) by mouth 2 (two) times daily. (Patient not taking: Reported on 09/28/2017) 20 tablet 0  . levofloxacin (LEVAQUIN) 500 MG tablet Take 1 tablet (500 mg total) by mouth daily. (Patient not taking: Reported on 09/28/2017) 14 tablet 0  . varenicline (CHANTIX STARTING MONTH PAK) 0.5 MG X 11 & 1 MG X 42 tablet 0.5 mg qdaily x3 d, then0.5 mg tab BID x 4 d, the 1 mg BID (Patient not taking: Reported on 09/28/2017) 53 tablet 0   Past Medical History:  Diagnosis Date  . Acute renal insufficiency 06/06/2014  . Asthma   . Constipation 07/19/2014  . COPD (chronic obstructive pulmonary disease) with acute bronchitis ([place]) 06/06/2014  . Depression   . Depression, major, recurrent, moderate ([place]) 04/02/2015  .  HIV infection ([place])   . Hyperlipidemia   . Hypertension   . Hypertensive nephropathy 07/19/2014  . Unintentional weight loss 02/13/2015   Vitals:   09/28/17 2359 09/29/17 0550 09/29/17 0726 09/29/17 0732  BP: (!) 116/57 139/68    Pulse: (!) 114 96    Resp:      Temp: 98.6 F (37 C) 97.7 F (36.5 C)    TempSrc: Oral Oral    SpO2: 100% 100% 93% 96%  Weight:      Height:       Dg Chest Portable 1 View  Result Date: 09/28/2017 CLINICAL DATA:  Productive cough and dyspnea x2 days. EXAM: PORTABLE CHEST 1 VIEW COMPARISON:  CXR 03/27/2004 and chest CT 05/04/2017 FINDINGS: Heart is normal in size. There is  minimal aortic atherosclerosis without aneurysmal dilatation. Emphysematous hyperinflation of the lungs, upper lobe predominant with crowding of lower lobe interstitial lung markings and subsegmental atelectasis and/or scarring at each base. No confluent airspace opacities to suggest pneumonia. No effusion or pneumothorax. No acute osseous abnormality. IMPRESSION: Emphysematous hyperinflation of the lungs, upper lobe predominant with resultant crowding of lower lobe interstitial markings and subsegmental atelectasis and/or scarring at each lung base. Electronically Signed   By: Ashley Royalty M.D.   On: 09/28/2017 18:36     Exam: Gen: Calm in bed, not in distress. HEENT: EOMI, PERRLA. Neuro: Awake and alert, oriented x 3. Heart: S1 S2 normal, no murmur. RRR. Lungs: Air entry equal but reduced B/L with a faint expiratory wheeze. Abd: Soft, NT/ND, BS+, and normal. Ext: No, edema.  A/P: Acute Hypoxic Respiratory Failure: New O2 requirement. Likely due to COPD exacerbation, uncertain if there is superimposed community-acquired pneumonia. Chest X-ray reviewed, neg infiltrate. WBC and glucose likely elevated due to Solumedrol treatment by EMS. PE less likely with WELLs score of 1.5 Try to taper down O2. If no improvement or unable to wean today, please obtain CTA or V/Q scan to r/o PE. I agree with current COPD management. She stated she got her flu shot 2 months ago. Need to check immunization record to confirm. Otherwise, flu shot after recovery/ completion of treatment.  Acute on Chronic Kidney Failure Stage 3A: May be related to dehydration although BUN/Cr ratio is less than 20:1 Continue maintenance fluid. Avoid nephrotoxic agents. Monitor kidney functions closely.  HIV infection: Consult ID.  Other chronic conditions, review and restart home regimen when appropriate.  Patient has been signed off to Dr. Erin Hearing today at [facility]

## 2017-09-29 NOTE — Progress Notes (Signed)
DOS: 06-24-2017 Bunionectomy Left; Hammer toe repair 2,3,4,5 Left; metatarsal phalangeal capsulotomy 2,3,4 Left  [facility]

## 2017-09-30 DIAGNOSIS — J9601 Acute respiratory failure with hypoxia: Secondary | ICD-10-CM

## 2017-09-30 DIAGNOSIS — N179 Acute kidney failure, unspecified: Secondary | ICD-10-CM

## 2017-09-30 DIAGNOSIS — J441 Chronic obstructive pulmonary disease with (acute) exacerbation: Principal | ICD-10-CM

## 2017-09-30 DIAGNOSIS — Z21 Asymptomatic human immunodeficiency virus [HIV] infection status: Secondary | ICD-10-CM

## 2017-09-30 LAB — BASIC METABOLIC PANEL
Anion gap: 11 (ref 5–15)
BUN: 35 mg/dL — AB (ref 6–20)
CALCIUM: 9.1 mg/dL (ref 8.9–10.3)
CO2: 25 mmol/L (ref 22–32)
CREATININE: 1.65 mg/dL — AB (ref 0.44–1.00)
Chloride: 104 mmol/L (ref 98–111)
GFR calc Af Amer: 39 mL/min — ABNORMAL LOW (ref >60)
GFR calc non Af Amer: 34 mL/min — ABNORMAL LOW (ref >60)
Glucose, Bld: 90 mg/dL (ref 70–99)
Potassium: 4.4 mmol/L (ref 3.5–5.1)
Sodium: 140 mmol/L (ref 135–145)

## 2017-09-30 LAB — T-HELPER CELLS (CD4) COUNT (NOT AT ARMC)
CD4 T CELL ABS: 170 /uL — AB (ref 400–2700)
CD4 T CELL HELPER: 12 % — AB (ref 33–55)

## 2017-09-30 MED ORDER — LEVOFLOXACIN 750 MG PO TABS
750.0000 mg | ORAL_TABLET | Freq: Every day | ORAL | Status: DC
  Administered 2017-09-30: 750 mg via ORAL
  Filled 2017-09-30 (×2): qty 1

## 2017-09-30 NOTE — Progress Notes (Signed)
Family Medicine Teaching Service Daily Progress Note Intern Pager: [phone]  Patient name: Molly Dillon Medical record number: [id number] Date of birth: [birth year] Age: 57 y.o. Gender: female  Primary Care Provider: Wheeler Bing, DO Consultants: none Code Status: Full code  Pt Overview and Major Events to Date:  Hospital Day 2 Admitted: 09/28/2017  Assessment and Plan: Molly Dillon is a 57 y.o. female who presented with SOB concerning for COPD exacerbation. PMHx includes HIV, COPD w/ asthma, MDDD, CKD-III, chronic pain, HLD, HTN,   #COPD exacerbation - 92% on 3L [place]. 14.4 wbc. Patient feels well. Feels close to baseline. Was able to take a shower without getting short of breath. Mild wheezing heard bilaterally. - wean off O2.  Walk patient.  If she can pass walk test she can be discharged.     - prednisone 40mg  x 5d - duonebs TID - dulera. Albuterol prn.  Consider increasing home COPD treatment.   - collect sputum cx and gram stain - levaquin (9/18-9/22).  Switch to oral levaquin - expectorant gram stain, AFB smear, bacterial/mycobacterial Cx - CD4 count - sputum gram stain  #HIV, stable Diagnosed 1993. CD4 710 on 09/15/2017.  - continue home entecavir-emtricitabine-tenofovir, 50-200-25 - awaiting cd4  #Hypertension, chronic Blood pressures in 110's/60's on average. MAPS in 70's.  Continue home amlodipine 10 mg  Continue to monitor  #Hyperlipidemia, chronic Last lipid profile LDL 104, TG 263. ASCVD risk is 5.9%.  Continue home pravastatin 20 mg.  Did not repeat lipid panel. Will leave to day team.  #MDD, stable  Appears to be in good spirits today on exam.  Continue homeWellbutrin 150 mg.  Continue to monitor mood  #Tobacco abuse Patient continues to smoke cigarettes though has decreased the amount per day and is now smoking once per day.   Continue homeChantix.  Patient would benefit from further counseling as smoking in the setting of HIV  doubles risk of pulmonary infections and can also decrease his CD4 counts and decrease immune system  #CKD, stable GFR 40 today. 49 (09/15/17), 51 (2018). Steady decline since 2015.Cr 1.62, baseline average 1.5 since 2015.   todays labs: bun 27, cr 1.91.   Avoid nephrotoxic agents   AM BMP  #Chronic pain, chronic At home, Cymbalta60 mg, Lyrica 75 mg, Voltaren gel. No complaints today.   Continue cymbalta and lyrica  #Eczema, chronic, stable No complaints today.   Kenalog PRN   Fluids: none . sodium chloride 100 mL/hr at 09/29/17 0215  . [START ON 09/30/2017] levofloxacin (LEVAQUIN) IV     Electrolytes: replete PRN  Nutrition: heart healthy GI ppx: miralax DVT px: lovenox  Disposition: home  Medications: Scheduled Meds: . amLODipine  10 mg Oral Daily  . bictegravir-emtricitabine-tenofovir AF  1 tablet Oral Daily  . buPROPion  150 mg Oral Daily  . DULoxetine  60 mg Oral Daily  . enoxaparin (LOVENOX) injection  40 mg Subcutaneous Q24H  . gentamicin cream  1 application Topical TID  . ipratropium-albuterol  3 mL Nebulization TID  . mometasone-formoterol  2 puff Inhalation BID  . pravastatin  20 mg Oral Daily  . predniSONE  40 mg Oral Q breakfast  . pregabalin  75 mg Oral BID  . triamcinolone ointment  1 application Topical BID  . varenicline  1 mg Oral BID   Continuous Infusions: . levofloxacin (LEVAQUIN) IV     PRN Meds: acetaminophen **OR** acetaminophen, albuterol, nicotine, polyethylene glycol  ================================================= ================================================= Subjective:  Patient is feeling better today.  Has no  complaints.  Was able to take a bath without becoming short of breath.    Objective: Temp:  [97.5 F (36.4 C)-98.9 F (37.2 C)] 97.5 F (36.4 C) (09/19 0422) Pulse Rate:  [91-104] 91 (09/19 0422) BP: (102-117)/(67-71) 111/68 (09/19 0422) SpO2:  [90 %-96 %] 92 % (09/19 0422) Intake/Output 09/18 0701 -  09/19 0700 In: 66 [P.O.:460; I.V.:849] Out: -  Physical Exam:  Gen: NAD, alert, non-toxic, well-nourished, well-appearing, sitting comfortably on 3L [place] CV: Regular rate and rhythm.  Normal S1-S2.  No bilateral lower extremity edema. Resp: wheezing heard bilaterally. Reduced airflow in the right lower field.  No increased work of breathing appreciated. Abd: Nontender and nondistended on palpation to all 4 quadrants.  Positive bowel sounds. Psych: Cooperative with exam. Pleasant. Makes eye contact. Extremities: Full ROM    Laboratory: Recent Labs  Lab 09/28/17 1558 09/29/17 0319  WBC 18.5* 14.4*  HGB 14.2 12.6  HCT 43.5 39.2  PLT 248 236   Recent Labs  Lab 09/28/17 1558 09/29/17 0319  NA 139 138  K 3.8 4.3  CL 100 99  CO2 26 24  BUN 20 27*  CREATININE 1.62* 1.91*  CALCIUM 9.2 9.0  GLUCOSE 132* 143*    Imaging/Diagnostic Tests: Dg Chest Portable 1 View  Result Date: 09/28/2017 CLINICAL DATA:  Productive cough and dyspnea x2 days. EXAM: PORTABLE CHEST 1 VIEW COMPARISON:  CXR 03/27/2004 and chest CT 05/04/2017 FINDINGS: Heart is normal in size. There is minimal aortic atherosclerosis without aneurysmal dilatation. Emphysematous hyperinflation of the lungs, upper lobe predominant with crowding of lower lobe interstitial lung markings and subsegmental atelectasis and/or scarring at each base. No confluent airspace opacities to suggest pneumonia. No effusion or pneumothorax. No acute osseous abnormality. IMPRESSION: Emphysematous hyperinflation of the lungs, upper lobe predominant with resultant crowding of lower lobe interstitial markings and subsegmental atelectasis and/or scarring at each lung base. Electronically Signed   By: Ashley Royalty M.D.   On: 09/28/2017 18:36      Benay Pike, MD 09/30/2017, 6:13 AM PGY-1, [place] Intern pager: [phone], text pages welcome

## 2017-09-30 NOTE — Progress Notes (Signed)
SATURATION QUALIFICATIONS: (This note is used to comply with regulatory documentation for home oxygen)  Patient Saturations on Room Air at Rest = 94%  Patient Saturations on Room Air while Ambulating = 88%    Please briefly explain why patient needs home oxygen:

## 2017-10-01 LAB — BASIC METABOLIC PANEL
ANION GAP: 12 (ref 5–15)
BUN: 34 mg/dL — ABNORMAL HIGH (ref 6–20)
CO2: 27 mmol/L (ref 22–32)
Calcium: 9.7 mg/dL (ref 8.9–10.3)
Chloride: 101 mmol/L (ref 98–111)
Creatinine, Ser: 1.63 mg/dL — ABNORMAL HIGH (ref 0.44–1.00)
GFR, EST AFRICAN AMERICAN: 40 mL/min — AB (ref >60)
GFR, EST NON AFRICAN AMERICAN: 34 mL/min — AB (ref >60)
GLUCOSE: 88 mg/dL (ref 70–99)
POTASSIUM: 4.2 mmol/L (ref 3.5–5.1)
Sodium: 140 mmol/L (ref 135–145)

## 2017-10-01 MED ORDER — TIOTROPIUM BROMIDE MONOHYDRATE 18 MCG IN CAPS: 18.0000 ug | ORAL_CAPSULE | Freq: Every day | RESPIRATORY_TRACT | 2 refills | Status: DC

## 2017-10-01 MED ORDER — LEVOFLOXACIN 750 MG PO TABS: ORAL_TABLET | ORAL | 0 refills | Status: DC

## 2017-10-01 MED ORDER — LEVOFLOXACIN 750 MG PO TABS: 750.0000 mg | ORAL_TABLET | ORAL | Status: DC

## 2017-10-01 MED ORDER — PREDNISONE 20 MG PO TABS: ORAL_TABLET | ORAL | 0 refills | Status: DC

## 2017-10-01 NOTE — Discharge Instructions (Signed)
1. Levoquin - we gave you a prescription for levoquin, which is an antibiotic.  You will only have one pill to take on 10/02/17.   2. Prednisone - we gave you a prescription for prednisone, a steroid. Please take both pills with breakfast on 10/02/17  3. spiriva - we have added this inhaler to your COPD treatment.  The pharmacy student spoke with you about how to use this inhaler. If you have any questions please ask your PCP when you see him during the followup appointment.   4. Nebulizer - We may be able to give you a nebulizer to take home for free during your followup appointment.    5. Dr. Tommy Medal - I will notify Dr. Tommy Medal about your recent CD4 levels.  He may want to make an appointment with you.  Please contact his office if you have not heard from him in a week.          COPD ACTION PLAN      Actions to Take if My Symptoms Get Worse       24-Hour Nurse Call Line:       [phone]  [place]: I am doing well today  Symptoms:        Actions:  - Usual activity and exercise level   - Continue to take daily    - Usual amounts of cough and phlegm/mucus  "maintenance" medicines (the  - Sleep well at night     the ones you take "no matter what"  - Appetite is good     until your doctor adjusts them for         you).          - Use oxygen as prescribed        - Continue regular exercise/diet plan        - At all times avoid cigarette smoke          and inhaled irritants  Yellow Zone:  I am having a bad day or a COPD flare  Symptoms:      Actions:  - More breathless than usual    - Continue daily medications - I have less energy for my daily activities  - Use quick relief inhaler or nebulizer  - Increased or thicker phlegm/mucus   every 4 hours - Change in color of phlegm/mucus   - Use oxygen as prescribed - Using quick relief inhaler/nebulizer more often - Get plenty of rest - More coughing than usual    - Use pursed lip breathing - I feel like I have a "chest cold"   -  At all times avoid cigarette smoke - Poor sleep and symptoms woke me up  and inhaled irritants - My appetite is not good    - Call provider for a SAME day or - My medicine is not helping    next day appointment         - if symptoms are not         Improving within 48 hours         onset or         - if symptoms are not         improving after quick relief          inhaler or nebulizer  Red Zone:  I need urgent medical care  Symptoms:      Actions:  - Severe shortness of breath even at  rest  - Call 911 or have someone take you - Severe shortness of breath even after quick  to the nearest emergency room relief inhaler or nebulizer    - Call providers for now or same day  - Not able to do any activity because of   Appointment Breathing - Fever or shaking chills - Feeling confused or very drowsy - Chest pains - Coughing up blood - Quick relief inhaler or nebulizer not effective    3 - 2 - 1 Plan: If any of the 3 main COPD symptoms (Cough, Shortness of Breath, or Mucus Production) change for 2 days or more, your number 1 priority is to call your doctor.

## 2017-10-01 NOTE — Care Management Note (Signed)
Case Management Note  Patient Details  Name: Molly Dillon MRN: [id number] Date of Birth: 26-Mar-1960  Subjective/Objective:     COPD exacerbation     Geovana Gebel (Spouse)     [phone]      PCP: [facility]  Action/Plan: Transition to home with oxygen. Portable oxygen will be delivered to bedside prior to discharge. Pt has transportation to home. Expected Discharge Date:  10/01/17               Expected Discharge Plan:     In-House Referral:     Discharge planning Services  CM Consult  Post Acute Care Choice:    Choice offered to:  Patient  DME Arranged:  Oxygen DME Agency:  [place]:   N/A HH Agency:    N/A Status of Service:  Completed, signed off  If discussed at [place] of Stay Meetings, dates discussed:    Additional Comments:  Sharin Mons, RN 10/01/2017, 11:22 AM

## 2017-10-01 NOTE — Care Management Important Message (Signed)
Important Message  Patient Details  Name: Molly Dillon MRN: [id number] Date of Birth: 04-06-1960   Medicare Important Message Given:  Yes    Orbie Pyo 10/01/2017, 2:24 PM

## 2017-10-01 NOTE — Progress Notes (Signed)
Nsg Discharge Note  Admit Date:  09/28/2017 Discharge date: 10/01/2017   Molly Dillon to be D/C'd Home per MD order.  AVS completed.  Copy for chart, and copy for patient signed, and dated. Patient/caregiver able to verbalize understanding.  Discharge Medication: Allergies as of 10/01/2017      Reactions   Dapsone Shortness Of Breath   Penicillins Anaphylaxis   Has patient had a PCN reaction causing immediate rash, facial/tongue/throat swelling, SOB or lightheadedness with hypotension: Yes Has patient had a PCN reaction causing severe rash involving mucus membranes or skin necrosis: Yes Has patient had a PCN reaction that required hospitalization: No Has patient had a PCN reaction occurring within the last 10 years: No If all of the above answers are "NO", then may proceed with Cephalosporin use.      Medication List    STOP taking these medications   clindamycin 300 MG capsule Commonly known as:  CLEOCIN   doxycycline 100 MG tablet Commonly known as:  VIBRA-TABS     TAKE these medications   albuterol 108 (90 Base) MCG/ACT inhaler Commonly known as:  PROVENTIL HFA;VENTOLIN HFA Inhale 2 puffs into the lungs every 6 (six) hours as needed for wheezing.   amLODipine 10 MG tablet Commonly known as:  NORVASC Take 1 tablet (10 mg total) by mouth daily.   bictegravir-emtricitabine-tenofovir AF 50-200-25 MG Tabs tablet Commonly known as:  BIKTARVY Take 1 tablet by mouth daily.   buPROPion 150 MG 12 hr tablet Commonly known as:  WELLBUTRIN SR Take 1 tablet (150 mg total) by mouth daily.   CHANTIX CONTINUING MONTH PAK 1 MG tablet Generic drug:  varenicline TAKE 1 TABLET (1 MG TOTAL) BY MOUTH 2 (TWO) TIMES DAILY. What changed:    See the new instructions.  Another medication with the same name was removed. Continue taking this medication, and follow the directions you see here.   diclofenac sodium 1 % Gel Commonly known as:  VOLTAREN Apply 2 g topically 4 (four) times  daily as needed (pain).   DULoxetine 30 MG capsule Commonly known as:  CYMBALTA TAKE 2 CAPSULES BY MOUTH DAILY   Fluticasone-Salmeterol 250-50 MCG/DOSE Aepb Commonly known as:  ADVAIR Inhale 1 puff into the lungs 2 (two) times daily.   gentamicin cream 0.1 % Commonly known as:  GARAMYCIN Apply 1 application topically 3 (three) times daily.   levofloxacin 750 MG tablet Commonly known as:  LEVAQUIN Take 1 tablet on 10/02/17. What changed:    medication strength  how much to take  how to take this  when to take this  additional instructions   nicotine 21 mg/24hr patch Commonly known as:  NICODERM CQ - dosed in mg/24 hours Place 1 patch (21 mg total) onto the skin daily. What changed:    when to take this  reasons to take this   nicotine polacrilex 4 MG gum Commonly known as:  NICORETTE Take 1 each (4 mg total) by mouth as needed for smoking cessation.   pravastatin 20 MG tablet Commonly known as:  PRAVACHOL Take 1 tablet (20 mg total) by mouth daily. Reported on 04/02/2015   predniSONE 20 MG tablet Commonly known as:  DELTASONE Take these two pills w/ breakfast on 10/02/17   pregabalin 75 MG capsule Commonly known as:  LYRICA TAKE 1 CAPSULE BY MOUTH TWICE DAILY   tiotropium 18 MCG inhalation capsule Commonly known as:  SPIRIVA Place 1 capsule (18 mcg total) into inhaler and inhale daily.   triamcinolone ointment 0.5 %  Commonly known as:  KENALOG Apply 1 application topically 2 (two) times daily.            Durable Medical Equipment  (From admission, onward)         Start     Ordered   10/01/17 1225  For home use only DME oxygen  Once    Question Answer Comment  Mode or (Route) Nasal cannula   Liters per Minute 2   Frequency Continuous (stationary and portable oxygen unit needed)   Oxygen delivery system Gas      10/01/17 1225          Discharge Assessment: Vitals:   10/01/17 0529 10/01/17 0805  BP: 126/71   Pulse: 73   Resp: 18    Temp: 98.4 F (36.9 C)   SpO2: 91% 93%   Skin clean, dry and intact without evidence of skin break down, no evidence of skin tears noted. IV catheter discontinued intact. Site without signs and symptoms of complications - no redness or edema noted at insertion site, patient denies c/o pain - only slight tenderness at site.  Dressing with slight pressure applied.  D/c Instructions-Education: Discharge instructions given to patient/family with verbalized understanding. D/c education completed with patient/family including follow up instructions, medication list, d/c activities limitations if indicated, with other d/c instructions as indicated by MD - patient able to verbalize understanding, all questions fully answered. Patient instructed to return to ED, call 911, or call MD for any changes in condition.  Patient escorted via [place], and D/C home via private auto.  Salley Slaughter, RN 10/01/2017 1:01 PM

## 2017-10-01 NOTE — Progress Notes (Addendum)
SATURATION QUALIFICATIONS: (This note is used to comply with regulatory documentation for home oxygen)  Patient Saturations on Room Air at Rest = 88%  Patient Saturations on Room Air while Ambulating = 82%  Patient Saturations on 2 Liters of oxygen while Ambulating = 89%  Please briefly explain why patient needs home oxygen: Currently not on 02 while resting in bed. On continuous pulse ox and it stays around 88% on RA at rest. Patient has not been wearing 02 at home and is asymptomatic with low sats while walking. May need PRN 02 at home when she is up moving around.

## 2017-10-01 NOTE — Progress Notes (Signed)
Family Medicine Teaching Service Daily Progress Note Intern Pager: [phone]  Patient name: Molly Dillon Medical record number: [id number] Date of birth: [birth year] Age: 57 y.o. Gender: female  Primary Care Provider: Alton Bing, DO Consultants: none Code Status: Full code  Pt Overview and Major Events to Date:  Hospital Day 3 Admitted: 09/28/2017  Assessment and Plan: Navjot Summersis a 57 y.o.femalewho presented with SOBconcerning for COPD exacerbation. PMHxincludes HIV, COPD w/ asthma, MDDD, CKD-III, chronic pain, HLD, HTN,   #COPD exacerbation-  S: patient stated she is feeling much better.  Would like to go home today.  O: ambulating on room air 88%. Mild wheezing b/l on deep expiration.  I/L:  -  Walk patient.  If she can pass walk test she can be discharged.     - prednisone 40mg  x 5d.  Last day 9/21.  - duonebs TID - dulera.  Albuterol prn.  - starting spiriva - levaquin (9/18-9/22).  Switched to oral levaquin - f/u sputum gram stain  #HIV, stable Diagnosed 1993. CD4 710 on 09/15/2017.  I/L: CD4 170, T helper 12%.   - told patient she would need to follow up with Dr. Tommy Medal, who treats her HIV - continue home entecavir-emtricitabine-tenofovir, 50-200-25  #Hypertension, chronic Blood pressures in 110's/60's on average. MAPS in 70's.  Continue home amlodipine 10 mg  Continue to monitor  #Hyperlipidemia, chronic Last lipid profile LDL 104, TG 263. ASCVD risk is 5.9%.  Continue home pravastatin 20 mg.  Did not repeat lipid panel. Will leave to day team.  #MDD, stable  Appears to be in good spirits today on exam.  Continue homeWellbutrin 150 mg.  Continue to monitor mood  #Tobacco abuse Patient continues to smoke cigarettes though has decreased the amount per day and is now smoking once per day.   Continue homeChantix.  #CKD, stable I/L: Cr 1.63, BUN 34,    Avoid nephrotoxic agents   AM BMP  #Chronic pain, chronic At home,  Cymbalta60 mg, Lyrica 75 mg, Voltaren gel. No complaints today.   Continue cymbalta and lyrica  #Eczema, chronic, stable No complaints today.   Kenalog PRN  Fluids:none . sodium chloride 100 mL/hr at 09/29/17 0215  . [START ON 09/30/2017] levofloxacin (LEVAQUIN) IV    Electrolytes:replete PRN Nutrition:heart healthy GI GLO:VFIEPPI DVT RJ:JOACZYS  Disposition: home  Medications: Scheduled Meds: . amLODipine  10 mg Oral Daily  . bictegravir-emtricitabine-tenofovir AF  1 tablet Oral Daily  . buPROPion  150 mg Oral Daily  . DULoxetine  60 mg Oral Daily  . enoxaparin (LOVENOX) injection  40 mg Subcutaneous Q24H  . gentamicin cream  1 application Topical TID  . ipratropium-albuterol  3 mL Nebulization TID  . levofloxacin  750 mg Oral Daily  . mometasone-formoterol  2 puff Inhalation BID  . pravastatin  20 mg Oral Daily  . predniSONE  40 mg Oral Q breakfast  . pregabalin  75 mg Oral BID  . triamcinolone ointment  1 application Topical BID  . varenicline  1 mg Oral BID   Continuous Infusions: PRN Meds: acetaminophen **OR** acetaminophen, albuterol, nicotine, polyethylene glycol  ================================================= ================================================= Subjective:  Patient states she feels better and is ready to go home. Was wondering if we could give her a nebulizer for home use.    Objective: Temp:  [98.1 F (36.7 C)-98.5 F (36.9 C)] 98.4 F (36.9 C) (09/20 0529) Pulse Rate:  [53-101] 73 (09/20 0529) Resp:  [18-19] 18 (09/20 0529) BP: (101-126)/(66-75) 126/71 (09/20 0529) SpO2:  [89 %-  95 %] 91 % (09/20 0529) Intake/Output 09/19 0701 - 09/20 0700 In: 360 [P.O.:360] Out: -  Physical Exam:  Gen: NAD, alert, non-toxic, well-nourished, well-appearing, sitting comfortably  HEENT: Normocephaic, atraumatic. Clear conjuctiva, no scleral icterus and injection.  CV: Regular rate and rhythm.  Normal S1-S2.   No bilateral lower  extremity edema. Resp: some mild wheezing heard on deep inhalation/expiration.  Abd: Nontender and nondistended on palpation to all 4 quadrants.  Positive bowel sounds. Psych: Cooperative with exam. Pleasant. Makes eye contact. Extremities: Full ROM    Laboratory: Recent Labs  Lab 09/28/17 1558 09/29/17 0319  WBC 18.5* 14.4*  HGB 14.2 12.6  HCT 43.5 39.2  PLT 248 236   Recent Labs  Lab 09/29/17 0319 09/30/17 0413 10/01/17 0436  NA 138 140 140  K 4.3 4.4 4.2  CL 99 104 101  CO2 24 25 27   BUN 27* 35* 34*  CREATININE 1.91* 1.65* 1.63*  CALCIUM 9.0 9.1 9.7  GLUCOSE 143* 90 88   Imaging/Diagnostic Tests: No results found.   Benay Pike, MD 10/01/2017, 6:15 AM PGY-1, [place] Intern pager: [phone], text pages welcome

## 2017-10-01 NOTE — Progress Notes (Signed)
Nsg Discharge Note  Admit Date:  09/28/2017 Discharge date: 10/01/2017   Molly Dillon to be D/C'd Home per MD order.  AVS completed. Patient/caregiver able to verbalize understanding.  Discharge Medication: Allergies as of 10/01/2017      Reactions   Dapsone Shortness Of Breath   Penicillins Anaphylaxis   Has patient had a PCN reaction causing immediate rash, facial/tongue/throat swelling, SOB or lightheadedness with hypotension: Yes Has patient had a PCN reaction causing severe rash involving mucus membranes or skin necrosis: Yes Has patient had a PCN reaction that required hospitalization: No Has patient had a PCN reaction occurring within the last 10 years: No If all of the above answers are "NO", then may proceed with Cephalosporin use.      Medication List    STOP taking these medications   clindamycin 300 MG capsule Commonly known as:  CLEOCIN   doxycycline 100 MG tablet Commonly known as:  VIBRA-TABS     TAKE these medications   albuterol 108 (90 Base) MCG/ACT inhaler Commonly known as:  PROVENTIL HFA;VENTOLIN HFA Inhale 2 puffs into the lungs every 6 (six) hours as needed for wheezing.   amLODipine 10 MG tablet Commonly known as:  NORVASC Take 1 tablet (10 mg total) by mouth daily.   bictegravir-emtricitabine-tenofovir AF 50-200-25 MG Tabs tablet Commonly known as:  BIKTARVY Take 1 tablet by mouth daily.   buPROPion 150 MG 12 hr tablet Commonly known as:  WELLBUTRIN SR Take 1 tablet (150 mg total) by mouth daily.   CHANTIX CONTINUING MONTH PAK 1 MG tablet Generic drug:  varenicline TAKE 1 TABLET (1 MG TOTAL) BY MOUTH 2 (TWO) TIMES DAILY. What changed:    See the new instructions.  Another medication with the same name was removed. Continue taking this medication, and follow the directions you see here.   diclofenac sodium 1 % Gel Commonly known as:  VOLTAREN Apply 2 g topically 4 (four) times daily as needed (pain).   DULoxetine 30 MG  capsule Commonly known as:  CYMBALTA TAKE 2 CAPSULES BY MOUTH DAILY   Fluticasone-Salmeterol 250-50 MCG/DOSE Aepb Commonly known as:  ADVAIR Inhale 1 puff into the lungs 2 (two) times daily.   gentamicin cream 0.1 % Commonly known as:  GARAMYCIN Apply 1 application topically 3 (three) times daily.   levofloxacin 750 MG tablet Commonly known as:  LEVAQUIN Take 1 tablet on 10/02/17. What changed:    medication strength  how much to take  how to take this  when to take this  additional instructions   nicotine 21 mg/24hr patch Commonly known as:  NICODERM CQ - dosed in mg/24 hours Place 1 patch (21 mg total) onto the skin daily. What changed:    when to take this  reasons to take this   nicotine polacrilex 4 MG gum Commonly known as:  NICORETTE Take 1 each (4 mg total) by mouth as needed for smoking cessation.   pravastatin 20 MG tablet Commonly known as:  PRAVACHOL Take 1 tablet (20 mg total) by mouth daily. Reported on 04/02/2015   predniSONE 20 MG tablet Commonly known as:  DELTASONE Take these two pills w/ breakfast on 10/02/17   pregabalin 75 MG capsule Commonly known as:  LYRICA TAKE 1 CAPSULE BY MOUTH TWICE DAILY   tiotropium 18 MCG inhalation capsule Commonly known as:  SPIRIVA Place 1 capsule (18 mcg total) into inhaler and inhale daily.   triamcinolone ointment 0.5 % Commonly known as:  KENALOG Apply 1 application topically 2 (  two) times daily.            Durable Medical Equipment  (From admission, onward)         Start     Ordered   10/01/17 1225  For home use only DME oxygen  Once    Question Answer Comment  Mode or (Route) Nasal cannula   Liters per Minute 2   Frequency Continuous (stationary and portable oxygen unit needed)   Oxygen delivery system Gas      10/01/17 1225          Discharge Assessment: Vitals:   10/01/17 0529 10/01/17 0805  BP: 126/71   Pulse: 73   Resp: 18   Temp: 98.4 F (36.9 C)   SpO2: 91% 93%    Skin clean, dry and intact without evidence of skin break down, no evidence of skin tears noted. IV catheter discontinued intact. Site without signs and symptoms of complications - no redness or edema noted at insertion site, patient denies c/o pain - only slight tenderness at site.  Dressing with slight pressure applied.  D/c Instructions-Education: Discharge instructions given to patient/family with verbalized understanding. D/c education completed with patient/family including follow up instructions, medication list, d/c activities limitations if indicated, with other d/c instructions as indicated by MD - patient able to verbalize understanding, all questions fully answered. Patient instructed to return to ED, call 911, or call MD for any changes in condition.  Patient escorted via [place], and D/C home via private auto.  Davene Costain, RN 10/01/2017 1:58 PM

## 2017-10-06 ENCOUNTER — Encounter: Payer: Self-pay | Admitting: Podiatry

## 2017-10-06 ENCOUNTER — Ambulatory Visit (INDEPENDENT_AMBULATORY_CARE_PROVIDER_SITE_OTHER): Payer: Medicare HMO | Admitting: Podiatry

## 2017-10-06 DIAGNOSIS — M2042 Other hammer toe(s) (acquired), left foot: Secondary | ICD-10-CM

## 2017-10-06 DIAGNOSIS — Z9889 Other specified postprocedural states: Secondary | ICD-10-CM

## 2017-10-06 NOTE — Progress Notes (Signed)
   Subjective:  Patient presents today status post bunionectomy and hammertoe repair of digits 2-5 of the left foot. DOS: 06/24/17. She states she is doing well overall. She reports some mild tenderness under the 2nd toe. There are no modifying factors noted. Patient is here for further evaluation and treatment.   Past Medical History:  Diagnosis Date  . Acute renal insufficiency 06/06/2014  . Asthma   . Constipation 07/19/2014  . COPD (chronic obstructive pulmonary disease) with acute bronchitis ([place]) 06/06/2014  . Depression   . Depression, major, recurrent, moderate ([place]) 04/02/2015  . HIV infection ([place])   . Hyperlipidemia   . Hypertension   . Hypertensive nephropathy 07/19/2014  . Unintentional weight loss 02/13/2015      Objective/Physical Exam Neurovascular status intact.  Skin incisions appear to be well coapted. No sign of infectious process noted. No dehiscence. No active bleeding noted.  Minimal amount of edema throughout the surgical extremity.   Wound noted to the left third toe has healed. Complete re-epithelialization has occurred. No drainage noted.    Assessment: 1. s/p bunionectomy and hammertoe repair of digits 2-5 left. DOS: 06/24/17 2. Ulceration of the left third toe - healed    Plan of Care:  1. Patient was evaluated.  2. May resume full activity with no restrictions.  3. Recommended good shoe gear.  4. Return to clinic as needed.    Edrick Kins, DPM [facility]  Dr. Edrick Kins, [employer]                                        [place], [place] [address]                Office [phone]  Fax [phone]

## 2017-10-07 ENCOUNTER — Ambulatory Visit (INDEPENDENT_AMBULATORY_CARE_PROVIDER_SITE_OTHER): Payer: Medicare HMO | Admitting: Family Medicine

## 2017-10-07 VITALS — BP 110/80 | HR 86 | Temp 98.2°F | Wt 165.0 lb

## 2017-10-07 DIAGNOSIS — Z21 Asymptomatic human immunodeficiency virus [HIV] infection status: Secondary | ICD-10-CM | POA: Diagnosis not present

## 2017-10-07 DIAGNOSIS — F17200 Nicotine dependence, unspecified, uncomplicated: Secondary | ICD-10-CM | POA: Diagnosis not present

## 2017-10-07 DIAGNOSIS — J449 Chronic obstructive pulmonary disease, unspecified: Secondary | ICD-10-CM | POA: Diagnosis not present

## 2017-10-07 DIAGNOSIS — Z23 Encounter for immunization: Secondary | ICD-10-CM | POA: Diagnosis not present

## 2017-10-07 NOTE — Assessment & Plan Note (Signed)
Chronic.  Currently on Chantix with minimal use of cigarettes, more recently without use due to COPD exacerbation.  Does appear to have history of occasional cravings.  Patient continues to be motivated to stop altogether. - Continue Chantix daily and discussed using additional nicotine replacement therapy as needed for cravings

## 2017-10-07 NOTE — Patient Instructions (Signed)
Thank you for coming in to see [place] today. Please see below to review our plan for today's visit.  1.  I am pleased to hear you are doing much better.  Continue taking the Spiriva and Advair daily.  These medications will help prevent future COPD exacerbations.  Use your albuterol inhaler 2 puffs every 4 hours as needed.  Should find yourself using your albuterol less over the next several days.  The oxygen we provided you should be used when you are walking to help keep your oxygen levels up.  I will have you follow-up in 2 weeks to reassess how we are doing. 2.  You are now up-to-date on your annual flu shot. 3.  It is important that you stop smoking to prevent future exacerbations and other complications.  You are on Chantix which should help alleviate your cravings.  If you continue having cravings, you may want to consider using other over-the-counter nicotine supplements such as gum or lozenges.  Please let me know if you need assistance with getting additional medication. 4.  Follow-up with Dr. Drucilla Schmidt as soon as possible to have your HIV checked.  Please call the clinic at [phone] if your symptoms worsen or you have any concerns. It was our pleasure to serve you.  Harriet Butte, [place], PGY-3

## 2017-10-07 NOTE — Assessment & Plan Note (Signed)
Chronic.  Historically well controlled though recent CD4 count decreased during hospitalization. - Continue Biktarvy daily and follow-up with infectious disease

## 2017-10-07 NOTE — Progress Notes (Signed)
Subjective   Patient ID: Molly Dillon    DOB: 12-Jun-1960, 57 y.o. female   MRN: [id number]  CC: "Hospital follow-up"  HPI: Molly Dillon is a 57 y.o. female who presents to clinic today for the following:  COPD: Patient presents today for hospital follow-up after 2 days of shortness of breath and productive cough found to be in acute hypoxic respiratory failure due to COPD exacerbation.  Patient completed a 5-day course of prednisone and Levaquin and discharged with home oxygen with ambulation.  She reports using her oxygen about 2 times per day usually while sitting.  Her shortness of breath and cough have significantly improved.  She was started on Spiriva and has been using her controller daily along with her Advair.  She has used her albuterol inhaler approximately 6 times since discharge both in the daytime in the evening.  She is not certain what triggered the exacerbation but did report some nasal congestion prior to the incident.  This was her first COPD exacerbation.  HIV: Patient has a long-term history of HIV well-controlled followed by Dr. Drucilla Schmidt with infectious disease.  Patient missed her last appointment due to hospitalization.  She was found to have a decreased CD4 count at 170 during hospitalization down from 710 approximately 3 weeks ago.  Patient attributes this to her HIV becoming "immune to the therapy."  She began a new medication approximately 3 months ago and has not followed up since June.  Smoking: Patient has a long history of smoking with a 36-pack-year history.  She has been on Chantix for the last 2 months with improvement of her cravings.  She does occasionally have cravings and is "eating more chocolate instead."  She has not picked up a cigarette since she was discharged from the hospital.  She did have a LDCT back in March with recommendations for a repeat scan in 12 months.  ROS: see HPI for pertinent.  [place]: HIV (controlled), HTN with nephropathy, CKDIII, HLD,  COPD/asthma, depression. Surgical history eye. Smoking status reviewed. Medications reviewed.  Objective   BP 110/80 (Cuff Size: Normal)   Pulse 86   Temp 98.2 F (36.8 C) (Oral)   Wt 165 lb (74.8 kg)   LMP 01/12/2011   SpO2 91%   BMI 28.32 kg/m  Vitals and nursing note reviewed.  General: well nourished, well developed, NAD with non-toxic appearance HEENT: normocephalic, atraumatic, moist mucous membranes Neck: supple, non-tender without lymphadenopathy Cardiovascular: regular rate and rhythm without murmurs, rubs, or gallops Lungs: clear to auscultation bilaterally with normal work of breathing on room air Skin: warm, dry, no rashes or lesions, cap refill < 2 seconds Extremities: warm and well perfused, normal tone, no edema  Assessment & Plan   COPD with asthma (HCC) Chronic.  Currently on triple therapy.  Recovering from first exacerbation.  Seems to be doing well even without use of oxygen despite indications at hospital discharge.  Patient making efforts to quit smoking.  This is also complicated by her underlying HFA which has historically been well controlled but recently has been showing signs of immunity to the ART therapy. - Continue Spiriva and Advair daily along with albuterol inhaler 2 puffs every 4 hours as needed - Reviewed return precautions - Continue home oxygen 2L [place] with ambulation only and follow-up in 2 weeks for reassessment - Flu shot given - See plan for tobacco use disorder and HIV  Tobacco use disorder Chronic.  Currently on Chantix with minimal use of cigarettes, more recently without  use due to COPD exacerbation.  Does appear to have history of occasional cravings.  Patient continues to be motivated to stop altogether. - Continue Chantix daily and discussed using additional nicotine replacement therapy as needed for cravings  HIV infection ([place]) Chronic.  Historically well controlled though recent CD4 count decreased during hospitalization. -  Continue Biktarvy daily and follow-up with infectious disease  Orders Placed This Encounter  Procedures  . Flu Vaccine QUAD 36+ mos IM   No orders of the defined types were placed in this encounter.   Harriet Butte, [place], PGY-3 10/07/2017, 9:07 AM

## 2017-10-07 NOTE — Assessment & Plan Note (Signed)
Chronic.  Currently on triple therapy.  Recovering from first exacerbation.  Seems to be doing well even without use of oxygen despite indications at hospital discharge.  Patient making efforts to quit smoking.  This is also complicated by her underlying HFA which has historically been well controlled but recently has been showing signs of immunity to the ART therapy. - Continue Spiriva and Advair daily along with albuterol inhaler 2 puffs every 4 hours as needed - Reviewed return precautions - Continue home oxygen 2L [place] with ambulation only and follow-up in 2 weeks for reassessment - Flu shot given - See plan for tobacco use disorder and HIV

## 2017-10-13 ENCOUNTER — Ambulatory Visit (INDEPENDENT_AMBULATORY_CARE_PROVIDER_SITE_OTHER): Payer: Medicare HMO | Admitting: Family

## 2017-10-13 ENCOUNTER — Encounter: Payer: Self-pay | Admitting: Family

## 2017-10-13 VITALS — BP 116/71 | HR 79 | Temp 98.3°F | Wt 168.0 lb

## 2017-10-13 DIAGNOSIS — I10 Essential (primary) hypertension: Secondary | ICD-10-CM | POA: Diagnosis not present

## 2017-10-13 DIAGNOSIS — Z1231 Encounter for screening mammogram for malignant neoplasm of breast: Secondary | ICD-10-CM | POA: Diagnosis not present

## 2017-10-13 DIAGNOSIS — J4489 Other specified chronic obstructive pulmonary disease: Secondary | ICD-10-CM

## 2017-10-13 DIAGNOSIS — N183 Chronic kidney disease, stage 3 unspecified: Secondary | ICD-10-CM

## 2017-10-13 DIAGNOSIS — B20 Human immunodeficiency virus [HIV] disease: Secondary | ICD-10-CM

## 2017-10-13 DIAGNOSIS — J449 Chronic obstructive pulmonary disease, unspecified: Secondary | ICD-10-CM

## 2017-10-13 NOTE — Assessment & Plan Note (Signed)
No current symptoms of exacerbation and appears well controlled.

## 2017-10-13 NOTE — Assessment & Plan Note (Signed)
Molly Dillon has well-controlled HIV disease with current antiretroviral therapy of Biktarvy.  She is adherent to the medication regimen with no adverse side effects.  No signs/symptoms of opportunistic infection through history or physical exam.  She has no problems obtaining her medication.  Referral for mammogram sent for general health screening.  She will schedule an appointment with Janene Madeira, NP for a Pap smear.  Continue current dose of Biktarvy.  Recheck CD4 count today with hospital CD4 count of 170, which I think is an error.  Plan for office follow-up in 4 months or sooner if needed with blood work 1 to 2 weeks prior to appointment.

## 2017-10-13 NOTE — Progress Notes (Signed)
Subjective:    Patient ID: Molly Dillon, female    DOB: July 08, 1960, 57 y.o.   MRN: [id number]  Chief Complaint  Patient presents with  . HIV Positive/AIDS     HPI:  Molly Dillon is a 57 y.o. female who presents today for a follow up office visit for HIV disease.   Ms. Molly Dillon was last seen in the office on 06/21/17 with good adherence to her antiretroviral of Triumeq and was switched to [place].  Her most recent blood work on 09/15/2017 shows a viral load that continues to be undetectable with a CD4 count of of 710.  Unfortunately she was recently hospitalized from 09/28/2017 to 10/02/2017 diagnosed with COPD exacerbation.  This was treated with levofloxacin, prednisone, and duo nebs.  Upon discharge she was changed to Spiriva.  During hospitalization her CD4 count was noted to be 170. She has received her influenza vaccination and appears to be up to date. She is due for a mammogram.   Ms. Molly Dillon has been taking her Biktarvy as prescribed with no adverse side effects.  She has not missed any doses.  Continues to be covered by [facility] and has no problems obtaining her medication from [facility]. Since leaving the hospital she has been doing better with no current symptoms. Not currently sexually active.  Denies fevers, chills, night sweats, headaches, changes in vision, neck pain/stiffness, nausea, diarrhea, vomiting, lesions or rashes.   Allergies  Allergen Reactions  . Dapsone Shortness Of Breath  . Penicillins Anaphylaxis    Has patient had a PCN reaction causing immediate rash, facial/tongue/throat swelling, SOB or lightheadedness with hypotension: Yes Has patient had a PCN reaction causing severe rash involving mucus membranes or skin necrosis: Yes Has patient had a PCN reaction that required hospitalization: No Has patient had a PCN reaction occurring within the last 10 years: No If all of the above answers are "NO", then may proceed with  Cephalosporin use.       Outpatient Medications Prior to Visit  Medication Sig Dispense Refill  . albuterol (PROVENTIL HFA;VENTOLIN HFA) 108 (90 Base) MCG/ACT inhaler Inhale 2 puffs into the lungs every 6 (six) hours as needed for wheezing. 1 Inhaler 11  . amLODipine (NORVASC) 10 MG tablet Take 1 tablet (10 mg total) by mouth daily. 30 tablet 11  . bictegravir-emtricitabine-tenofovir AF (BIKTARVY) 50-200-25 MG TABS tablet Take 1 tablet by mouth daily. 30 tablet 11  . buPROPion (WELLBUTRIN SR) 150 MG 12 hr tablet Take 1 tablet (150 mg total) by mouth daily. 30 tablet 1  . CHANTIX CONTINUING MONTH PAK 1 MG tablet TAKE 1 TABLET (1 MG TOTAL) BY MOUTH 2 (TWO) TIMES DAILY. (Patient taking differently: Take 1 mg by mouth 2 (two) times daily. ) 56 tablet 0  . diclofenac sodium (VOLTAREN) 1 % GEL Apply 2 g topically 4 (four) times daily as needed (pain).   2  . DULoxetine (CYMBALTA) 30 MG capsule TAKE 2 CAPSULES BY MOUTH DAILY (Patient taking differently: Take 60 mg by mouth daily. ) 60 capsule 2  . Fluticasone-Salmeterol (ADVAIR DISKUS) 250-50 MCG/DOSE AEPB Inhale 1 puff into the lungs 2 (two) times daily. 60 each 11  . gentamicin cream (GARAMYCIN) 0.1 % Apply 1 application topically 3 (three) times daily. 30 g 1  . nicotine (NICODERM CQ - DOSED IN MG/24 HOURS) 21 mg/24hr patch Place 1 patch (21 mg total) onto the skin daily. (Patient taking differently: Place 21 mg onto the skin daily as needed (nicotine dependence). )  28 patch 0  . nicotine polacrilex (NICORETTE) 4 MG gum Take 1 each (4 mg total) by mouth as needed for smoking cessation. 100 tablet 0  . pravastatin (PRAVACHOL) 20 MG tablet Take 1 tablet (20 mg total) by mouth daily. Reported on 04/02/2015 30 tablet 11  . pregabalin (LYRICA) 75 MG capsule TAKE 1 CAPSULE BY MOUTH TWICE DAILY (Patient taking differently: Take 75 mg by mouth 2 (two) times daily. ) 60 capsule 2  . tiotropium (SPIRIVA HANDIHALER) 18 MCG inhalation capsule Place 1 capsule (18  mcg total) into inhaler and inhale daily. 30 capsule 2  . triamcinolone ointment (KENALOG) 0.5 % Apply 1 application topically 2 (two) times daily. 30 g 3   No facility-administered medications prior to visit.      Past Medical History:  Diagnosis Date  . Acute renal insufficiency 06/06/2014  . Asthma   . Constipation 07/19/2014  . COPD (chronic obstructive pulmonary disease) with acute bronchitis ([place]) 06/06/2014  . Depression   . Depression, major, recurrent, moderate ([place]) 04/02/2015  . HIV infection ([place])   . Hyperlipidemia   . Hypertension   . Hypertensive nephropathy 07/19/2014  . Unintentional weight loss 02/13/2015     History reviewed. No pertinent surgical history.   Review of Systems  Constitutional: Negative for appetite change, chills, diaphoresis, fatigue, fever and unexpected weight change.  Eyes:       Negative for acute change in vision  Respiratory: Negative for cough, chest tightness, shortness of breath and wheezing.   Cardiovascular: Negative for chest pain and leg swelling.  Gastrointestinal: Negative for abdominal distention, constipation, diarrhea, nausea and vomiting.  Genitourinary: Negative for dysuria, pelvic pain and vaginal discharge.  Musculoskeletal: Negative for neck pain and neck stiffness.  Skin: Negative for rash.  Neurological: Negative for dizziness, seizures, syncope, weakness, light-headedness and headaches.  Hematological: Negative for adenopathy. Does not bruise/bleed easily.  Psychiatric/Behavioral: Negative for hallucinations.      Objective:    BP 116/71   Pulse 79   Temp 98.3 F (36.8 C)   Wt 168 lb (76.2 kg)   LMP 01/12/2011   BMI 28.84 kg/m  Nursing note and vital signs reviewed.  Physical Exam  Constitutional: She is oriented to person, place, and time. She appears well-developed and well-nourished. No distress.  Cardiovascular: Normal rate, regular rhythm, normal heart sounds and intact distal pulses. Exam reveals no  gallop and no friction rub.  No murmur heard. Pulmonary/Chest: Effort normal and breath sounds normal. No stridor. No respiratory distress. She has no wheezes. She has no rales. She exhibits no tenderness.  Abdominal: Soft. Bowel sounds are normal. She exhibits no distension. There is no tenderness.  Neurological: She is alert and oriented to person, place, and time.  Skin: Skin is warm and dry.  Psychiatric: She has a normal mood and affect. Her behavior is normal. Judgment and thought content normal.       Assessment & Plan:   Problem List Items Addressed This Visit      Cardiovascular and Mediastinum   Primary hypertension (Chronic)    Blood pressure well controlled with current dose of amlodipine. Continue to monitor.         Respiratory   COPD with asthma ([place]) (Chronic)    No current symptoms of exacerbation and appears well controlled.         Genitourinary   CKD (chronic kidney disease), stage III (HCC) (Chronic)    Kidney function appears stable with CKD Stage III. Will continue  to monitor and consider changing off tenofovir if kidney function were to decline.         Other   Human immunodeficiency virus (HIV) disease ([place]) - Primary (Chronic)    Ms. Hirota has well-controlled HIV disease with current antiretroviral therapy of Biktarvy.  She is adherent to the medication regimen with no adverse side effects.  No signs/symptoms of opportunistic infection through history or physical exam.  She has no problems obtaining her medication.  Referral for mammogram sent for general health screening.  She will schedule an appointment with Janene Madeira, NP for a Pap smear.  Continue current dose of Biktarvy.  Recheck CD4 count today with hospital CD4 count of 170, which I think is an error.  Plan for office follow-up in 4 months or sooner if needed with blood work 1 to 2 weeks prior to appointment.      Relevant Orders   T-helper cell (CD4)- ([facility] clinic only)   T-helper cell  (CD4)- ([facility] clinic only)   HIV-1 RNA quant-no reflex-bld   CBC   Comprehensive metabolic panel   Lipid panel   RPR    Other Visit Diagnoses    Encounter for screening mammogram for breast cancer       Relevant Orders   MM DIGITAL SCREENING BILATERAL       I am having Molly Dillon maintain her buPROPion, triamcinolone ointment, albuterol, amLODipine, pravastatin, Fluticasone-Salmeterol, nicotine, nicotine polacrilex, bictegravir-emtricitabine-tenofovir AF, diclofenac sodium, gentamicin cream, CHANTIX CONTINUING MONTH PAK, pregabalin, DULoxetine, and tiotropium.   No orders of the defined types were placed in this encounter.    Follow-up: Return in about 4 months (around 02/13/2018), or if symptoms worsen or fail to improve.   Terri Piedra, MSN, FNP-C Nurse Practitioner [facility] for [facility]ious [facility] Office phone: [phone] Pager: [place] number: [phone]

## 2017-10-13 NOTE — Assessment & Plan Note (Signed)
Kidney function appears stable with CKD Stage III. Will continue to monitor and consider changing off tenofovir if kidney function were to decline.

## 2017-10-13 NOTE — Patient Instructions (Signed)
Nice to meet you.  We will recheck your CD4 count today.  Continue to take your [place] as prescribed.  You will receive a call to schedule your mammogram.  Schedule an appointment with Janene Madeira, NP for your PAP smear.   Follow up with Dr. Tommy Medal in 4 months or sooner if needed with blood work 1-2 weeks prior to your appointment.

## 2017-10-13 NOTE — Assessment & Plan Note (Signed)
Blood pressure well controlled with current dose of amlodipine. Continue to monitor.

## 2017-10-14 LAB — T-HELPER CELL (CD4) - (RCID CLINIC ONLY)
CD4 % Helper T Cell: 19 % — ABNORMAL LOW (ref 33–55)
CD4 T Cell Abs: 800 /uL (ref 400–2700)

## 2017-10-22 ENCOUNTER — Ambulatory Visit: Payer: Medicare HMO | Admitting: Family Medicine

## 2017-10-25 ENCOUNTER — Other Ambulatory Visit: Payer: Self-pay | Admitting: Infectious Disease

## 2017-10-25 DIAGNOSIS — B20 Human immunodeficiency virus [HIV] disease: Secondary | ICD-10-CM

## 2017-10-25 MED FILL — BIKTARVY 50-200-25 MG TABS: 50-200-25 | 30 days supply | Qty: 30 | Fill #4

## 2017-10-25 MED FILL — PRAVASTATIN SODIUM 20 MG TA: 20 | 30 days supply | Qty: 30 | Fill #9

## 2017-10-25 MED FILL — AMLODIPINE BESYLATE 10 MG T: 10 | 30 days supply | Qty: 30 | Fill #9

## 2017-10-25 MED FILL — CHANTIX 1 MG CONT MONTH BOX: 1 | 28 days supply | Qty: 56 | Fill #0

## 2017-10-25 MED FILL — ADVAIR 250/50 DISKUS: 250-50 | 60 days supply | Qty: 60 | Fill #2

## 2017-10-25 MED FILL — PREGABALIN 75 MG CAPS: 75 | 30 days supply | Qty: 60 | Fill #1

## 2017-10-25 MED FILL — DULoxetine HCL 30 MG CPEP: 30 | 30 days supply | Qty: 60 | Fill #1

## 2017-10-28 ENCOUNTER — Ambulatory Visit: Payer: Medicare HMO | Admitting: Infectious Diseases

## 2017-11-08 ENCOUNTER — Ambulatory Visit (INDEPENDENT_AMBULATORY_CARE_PROVIDER_SITE_OTHER): Payer: Medicare HMO | Admitting: Family Medicine

## 2017-11-08 ENCOUNTER — Encounter: Payer: Self-pay | Admitting: Family Medicine

## 2017-11-08 ENCOUNTER — Other Ambulatory Visit: Payer: Self-pay

## 2017-11-08 DIAGNOSIS — J4489 Other specified chronic obstructive pulmonary disease: Secondary | ICD-10-CM

## 2017-11-08 DIAGNOSIS — F17200 Nicotine dependence, unspecified, uncomplicated: Secondary | ICD-10-CM

## 2017-11-08 DIAGNOSIS — J449 Chronic obstructive pulmonary disease, unspecified: Secondary | ICD-10-CM | POA: Diagnosis not present

## 2017-11-08 MED ORDER — TIOTROPIUM BROMIDE MONOHYDRATE 18 MCG IN CAPS: 18.0000 ug | ORAL_CAPSULE | Freq: Every day | RESPIRATORY_TRACT | 2 refills | Status: DC

## 2017-11-08 NOTE — Progress Notes (Signed)
   Subjective:   Patient ID: Molly Dillon    DOB: August 25, 1960, 57 y.o. female   MRN: [id number]  CC: f/u COPD  HPI: Molly Dillon is a 57 y.o. female who presents to clinic today for hospital f/u for COPD exacerbation.  COPD exacerbation Patient reports she had a COPD exacerbation 1 month ago.  She reports overall improvement but some wheezing.  Using Advair and Spiriva at home.  She is on 2L O2 as needed at home.  She reports dry cough.  She is a former smoker, quit last month cold [place], now using Chantix.  Feels like she is doing well and hasn't smoked since leaving the hospital.   Uses albuterol as needed.  She denies fever, chills, nausea, vomiting.  Denies SOB, CP.  She states she needs a refill on her Spiriva.  No other concerns at this time.  Tobacco use Quit last month.  On Chantix, still has occasional cravings.    ROS: See HPI for pertinent ROS.  Social: Former smoker.  Medications reviewed. Objective:   BP 126/78   Pulse 84   Temp 97.9 F (36.6 C) (Oral)   Ht 5\' 4"  (1.626 m)   Wt 172 lb (78 kg)   LMP 01/12/2011   SpO2 96%   BMI 29.52 kg/m  Vitals and nursing note reviewed.  General: 57 year old female, NAD Neck: supple, non-tender, normal ROM, no LAD  CV: RRR no MRG  Lungs: CTAB, normal effort on RA, no wheeze noted  Abdomen: soft, NTND, +bs  Skin: warm, dry, no rash Extremities: warm and well perfused, normal tone  Assessment & Plan:   COPD with asthma (HCC) Well-controlled, no current symptoms of exacerbation.  Lung exam clear.  Spiriva refilled.  Tobacco use disorder Doing well on Chantix.  Does not have any current concerns.  Meds ordered this encounter  Medications  . tiotropium (SPIRIVA HANDIHALER) 18 MCG inhalation capsule    Sig: Place 1 capsule (18 mcg total) into inhaler and inhale daily.    Dispense:  30 capsule    Refill:  2   Lovenia Kim, MD [place], PGY-3 11/14/2017 8:53 PM

## 2017-11-08 NOTE — Patient Instructions (Signed)
It was very nice meeting you today!  You were seen in clinic for follow-up of your COPD and tobacco use.  I am glad you are feeling much better currently and would recommend continuing your Advair and Spiriva at home.  For your tobacco cessation, we discussed not using nicotine lozenges, patches or gum while using Chantix.  Since you are doing better with this, I would recommend continuing Chantix only at this time.  You may make an appointment with your PCP as needed.  Please call clinic if you have any questions.  Lovenia Kim MD

## 2017-11-14 NOTE — Assessment & Plan Note (Signed)
Well-controlled, no current symptoms of exacerbation.  Lung exam clear.  Spiriva refilled.

## 2017-11-14 NOTE — Assessment & Plan Note (Signed)
Doing well on Chantix.  Does not have any current concerns.

## 2017-11-16 ENCOUNTER — Other Ambulatory Visit: Payer: Self-pay | Admitting: Family

## 2017-11-16 DIAGNOSIS — B20 Human immunodeficiency virus [HIV] disease: Secondary | ICD-10-CM

## 2017-11-24 MED FILL — AMLODIPINE BESYLATE 10 MG T: 10 | 30 days supply | Qty: 30 | Fill #10

## 2017-11-24 MED FILL — DULoxetine HCL 30 MG CPEP: 30 | 30 days supply | Qty: 60 | Fill #2

## 2017-11-24 MED FILL — BIKTARVY 50-200-25 MG TABS: 50-200-25 | 30 days supply | Qty: 30 | Fill #5

## 2017-11-24 MED FILL — ADVAIR 250/50 DISKUS: 250-50 | 30 days supply | Qty: 60 | Fill #3

## 2017-11-24 MED FILL — PREGABALIN 75 MG CAPS: 75 | 30 days supply | Qty: 60 | Fill #2

## 2017-11-24 MED FILL — CHANTIX 1 MG CONT MONTH BOX: 1 | 28 days supply | Qty: 56 | Fill #0

## 2017-11-24 MED FILL — PRAVASTATIN SODIUM 20 MG TA: 20 | 30 days supply | Qty: 30 | Fill #10

## 2017-12-03 ENCOUNTER — Encounter: Payer: Self-pay | Admitting: Family Medicine

## 2017-12-07 ENCOUNTER — Other Ambulatory Visit: Payer: Self-pay | Admitting: Family Medicine

## 2017-12-07 DIAGNOSIS — J449 Chronic obstructive pulmonary disease, unspecified: Secondary | ICD-10-CM

## 2017-12-07 DIAGNOSIS — J4489 Other specified chronic obstructive pulmonary disease: Secondary | ICD-10-CM

## 2017-12-07 NOTE — Progress Notes (Signed)
Molly Dillon responded.  He will process order. Akiva Brassfield, Salome Spotted, CMA

## 2017-12-07 NOTE — Progress Notes (Signed)
Community message sent to [employer] and Darlina Guys @ [facility] [place] to process DME order.   Parthenia Tellefsen, Salome Spotted, CMA

## 2017-12-23 IMAGING — CT CT ABD-PELV W/O CM
2 of 4 series · 11 of 36 positions shown, 18 images · IV contrast (READICAT/WATER)
Comparison: Chest radiograph, 03/27/2004.

CLINICAL DATA: 20 pound weight loss over the last several months.
Diarrhea. Generalized chest and abdominal pain. Positive smoking
history.

EXAM:
CT CHEST, ABDOMEN AND PELVIS WITHOUT CONTRAST
TECHNIQUE: Multidetector CT imaging of the chest, abdomen and pelvis was
performed following the standard protocol without IV contrast.

[Series 601: coronal body · coronal · 1.19mm/px · 1 of 112 slices shown, 2 images]
[im 38/112  soft-tissue]
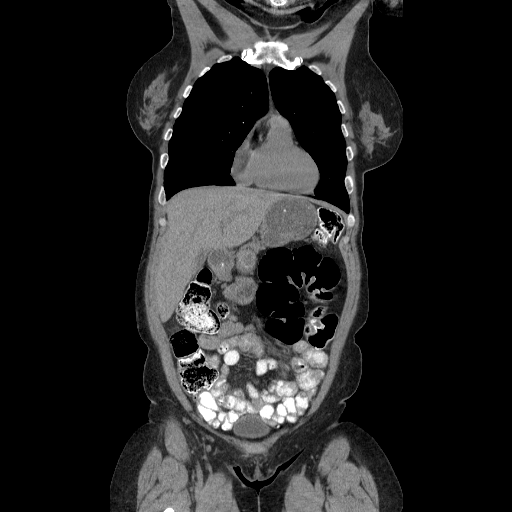
[im 38/112  bone]
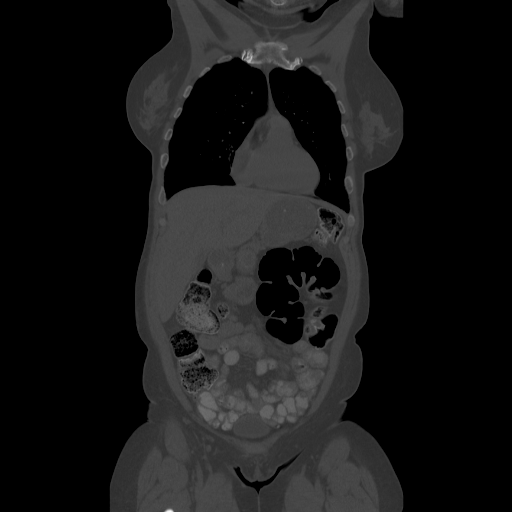

[Series 602: sagittal body · sagittal · 1.19mm/px · 10 of 145 slices shown, 16 images]
[im 14/145  soft-tissue]
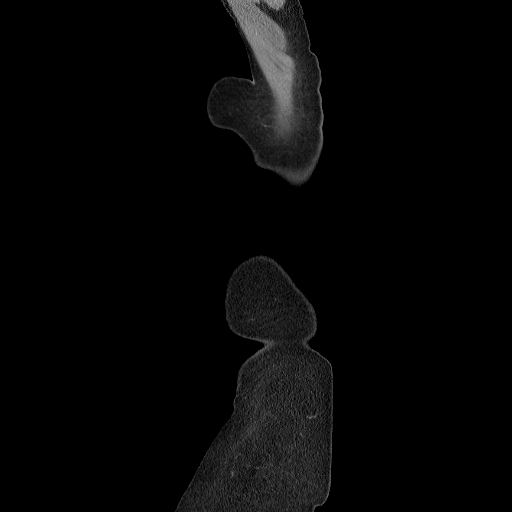
[im 14/145  lung]
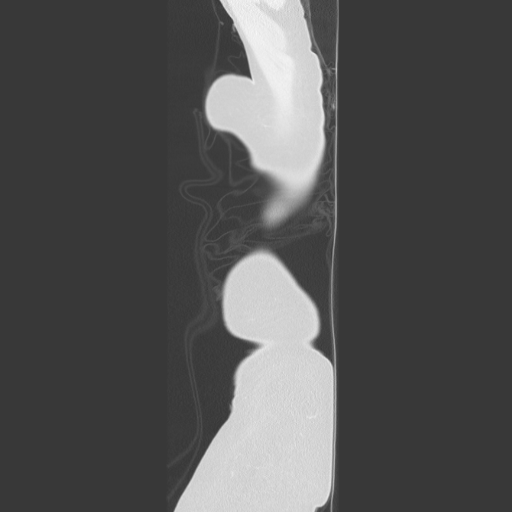
[im 14/145  bone]
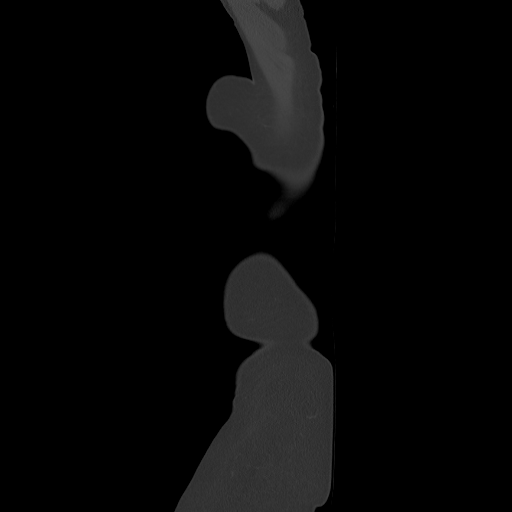
[im 27/145  soft-tissue]
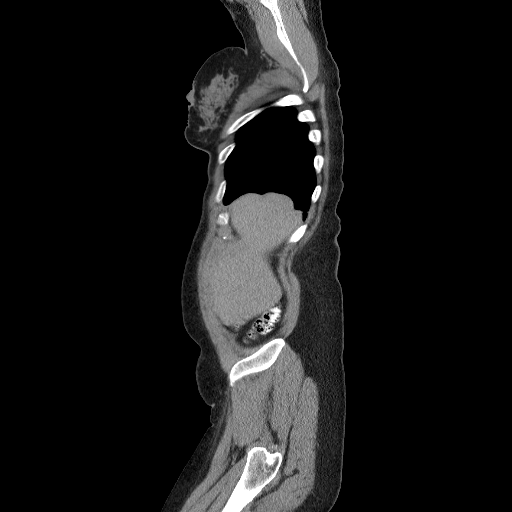
[im 27/145  lung]
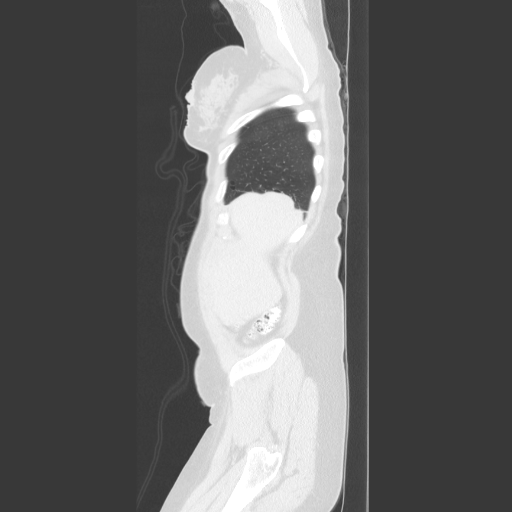
[im 40/145  soft-tissue]
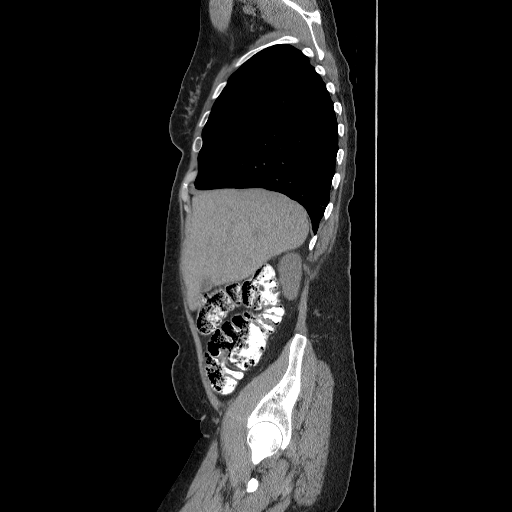
[im 40/145  lung]
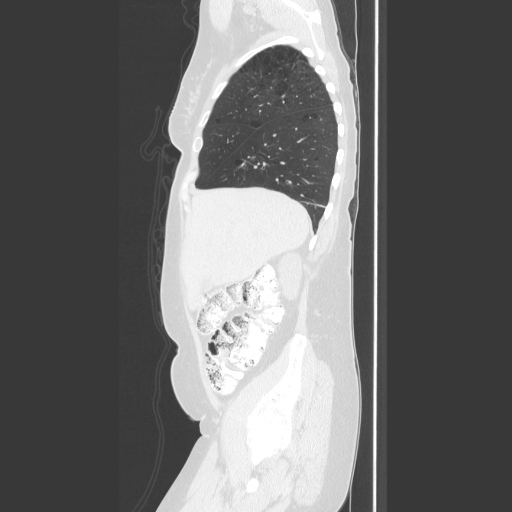
[im 53/145  soft-tissue]
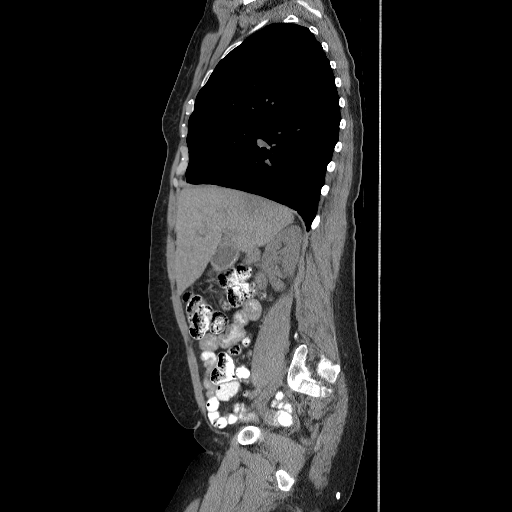
[im 53/145  lung]
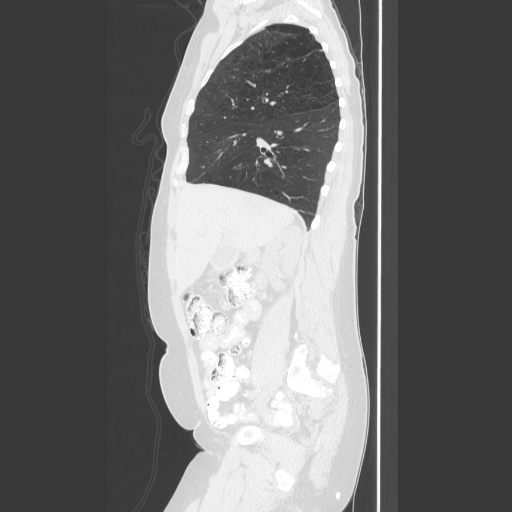
[im 66/145  soft-tissue]
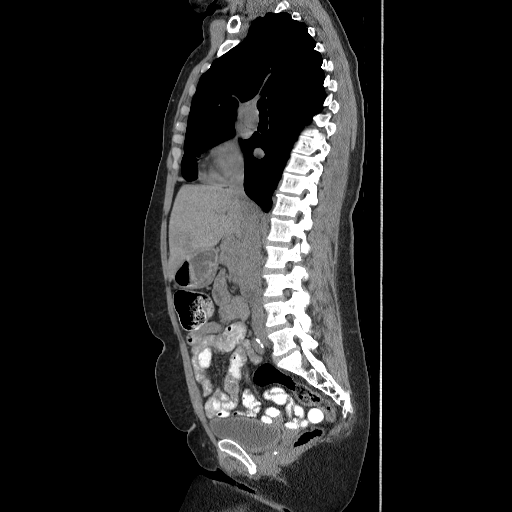
[im 79/145  soft-tissue]
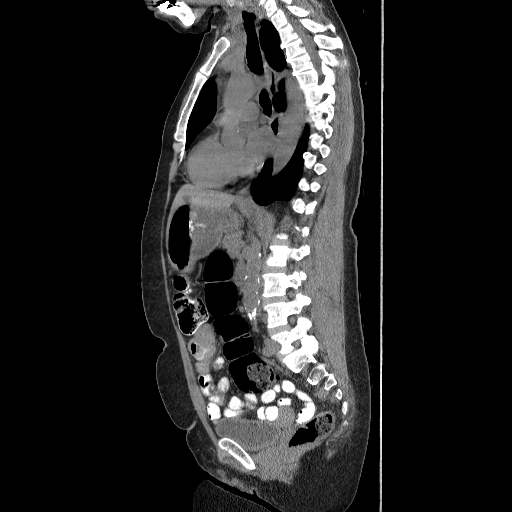
[im 92/145  soft-tissue]
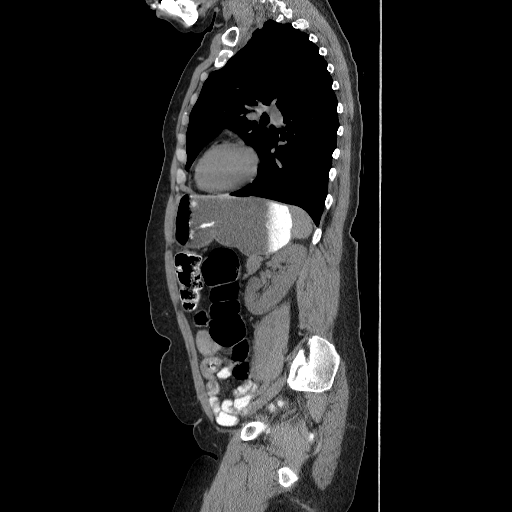
[im 105/145  soft-tissue]
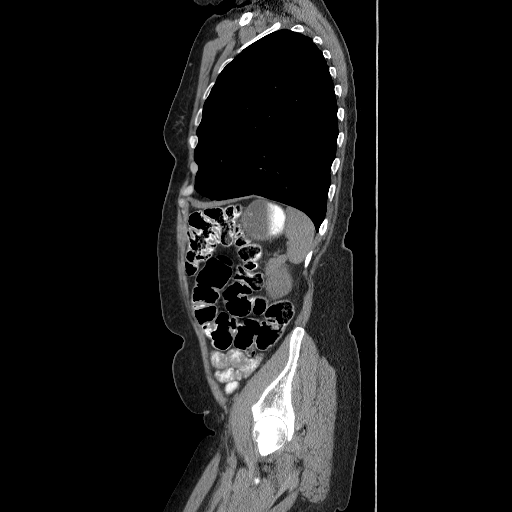
[im 118/145  soft-tissue]
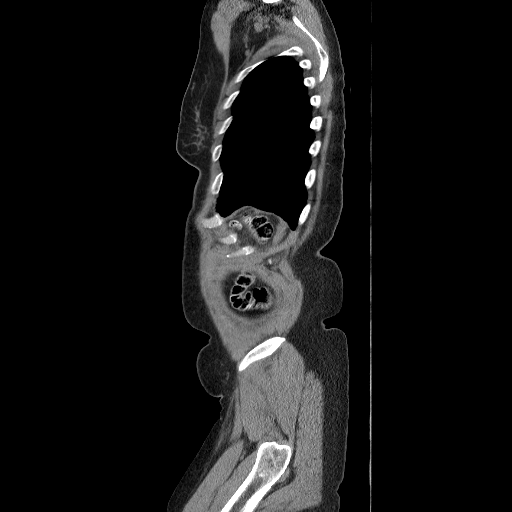
[im 118/145  bone]
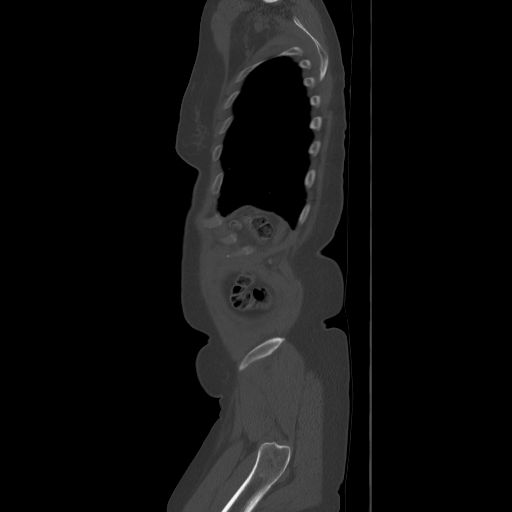
[im 131/145  soft-tissue]
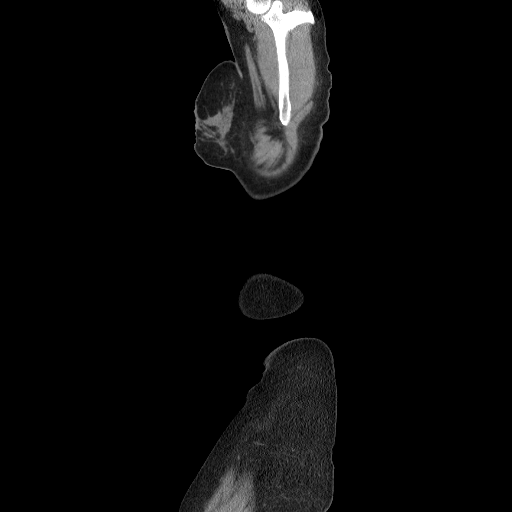

[11 of 36 positions shown; findings below may reference images not displayed]

FINDINGS: CT CHEST FINDINGS

Cardiovascular: Heart is normal in size and configuration. There are
no significant coronary artery calcifications. The great vessels
normal in caliber. Minor partly calcified atherosclerotic plaque is
noted along the aortic arch and the origin of the branch vessels.

Mediastinum/Nodes: No neck base, axillary, mediastinal or hilar
masses or adenopathy. Trachea and esophagus are unremarkable.

Lungs/Pleura: Moderate to advanced centrilobular emphysema, greater
in the right upper lobe. No lung mass or suspicious nodule. Minor
lung base subsegmental atelectasis. No evidence of pneumonia or
pulmonary edema. No pleural effusion or pneumothorax.

CT ABDOMEN PELVIS FINDINGS

Hepatobiliary: Normal liver. Evidence of subtle small dependent
gallstones. Gallbladder is otherwise unremarkable. No bile duct
dilation.

Pancreas: Unremarkable. No pancreatic ductal dilatation or
surrounding inflammatory changes.

Spleen: Normal in size without focal abnormality.

Adrenals/Urinary Tract: Adrenal glands are unremarkable. Kidneys are
normal, without renal calculi, focal lesion, or hydronephrosis.
Bladder is unremarkable.

Stomach/Bowel: Stomach and small bowel are unremarkable. There is
moderate increased stool throughout colon. No colonic wall
thickening or inflammation. No evidence of bowel obstruction. Normal
appendix visualized.

Vascular/Lymphatic: Aortic atherosclerosis. No enlarged abdominal or
pelvic lymph nodes.

Reproductive: Uterus and bilateral adnexa are unremarkable.

Other: No abdominal wall hernia or abnormality. No abdominopelvic
ascites.

MUSCULOSKELETAL FINDINGS

No acute findings. No osteoblastic or osteolytic lesions. Chronic
bilateral avascular necrosis of the femoral heads.
IMPRESSION: 1. No acute findings in the chest, abdomen or pelvis.
2. No evidence of malignancy.
3. Moderate to advanced centrilobular emphysema.
4. Moderate increased stool noted throughout the colon. No bowel
obstruction or bowel wall thickening or inflammation.
5. Small gallstones.  No acute cholecystitis.
6. Chronic avascular necrosis of both femoral heads.

## 2017-12-23 MED FILL — BIKTARVY 50-200-25 MG TABS: 50-200-25 | 30 days supply | Qty: 30 | Fill #6

## 2017-12-25 ENCOUNTER — Other Ambulatory Visit (HOSPITAL_COMMUNITY): Payer: Self-pay | Admitting: Family Medicine

## 2017-12-27 ENCOUNTER — Other Ambulatory Visit: Payer: Self-pay | Admitting: Family Medicine

## 2017-12-27 DIAGNOSIS — M545 Low back pain, unspecified: Secondary | ICD-10-CM

## 2017-12-28 ENCOUNTER — Encounter: Payer: Self-pay | Admitting: Family Medicine

## 2017-12-28 MED FILL — PREGABALIN 75 MG CAPS: 75 | 30 days supply | Qty: 60 | Fill #0

## 2017-12-28 MED FILL — AMLODIPINE BESYLATE 10 MG T: 10 | 30 days supply | Qty: 30 | Fill #11

## 2017-12-28 MED FILL — DULoxetine HCL 30 MG CPEP: 30 | 30 days supply | Qty: 60 | Fill #0

## 2017-12-28 MED FILL — ADVAIR 250/50 DISKUS: 250-50 | 30 days supply | Qty: 60 | Fill #4

## 2017-12-28 MED FILL — PRAVASTATIN SODIUM 20 MG TA: 20 | 30 days supply | Qty: 30 | Fill #11

## 2017-12-29 ENCOUNTER — Other Ambulatory Visit: Payer: Self-pay | Admitting: Family Medicine

## 2017-12-29 MED ORDER — ALBUTEROL SULFATE (2.5 MG/3ML) 0.083% IN NEBU: 2.5000 mg | INHALATION_SOLUTION | Freq: Four times a day (QID) | RESPIRATORY_TRACT | 12 refills | Status: DC | PRN

## 2018-01-17 ENCOUNTER — Other Ambulatory Visit: Payer: Self-pay | Admitting: Infectious Disease

## 2018-01-17 DIAGNOSIS — E7849 Other hyperlipidemia: Secondary | ICD-10-CM

## 2018-01-17 DIAGNOSIS — I10 Essential (primary) hypertension: Secondary | ICD-10-CM

## 2018-01-20 ENCOUNTER — Other Ambulatory Visit: Payer: Self-pay | Admitting: Infectious Disease

## 2018-01-20 MED FILL — PRAVASTATIN SODIUM 20 MG TA: 20 | 30 days supply | Qty: 30 | Fill #0

## 2018-01-20 MED FILL — BIKTARVY 50-200-25 MG TABS: 50-200-25 | 30 days supply | Qty: 30 | Fill #7

## 2018-01-20 MED FILL — ALBUTEROL 0.083 MG/ML SOLN: (2.5 MG/3ML | 7 days supply | Qty: 90 | Fill #0

## 2018-01-20 MED FILL — DULoxetine HCL 30 MG CPEP: 30 | 30 days supply | Qty: 60 | Fill #1

## 2018-01-20 MED FILL — AMLODIPINE BESYLATE 10 MG T: 10 | 30 days supply | Qty: 30 | Fill #0

## 2018-01-20 MED FILL — PREGABALIN 75 MG CAPS: 75 | 30 days supply | Qty: 60 | Fill #1

## 2018-02-07 ENCOUNTER — Other Ambulatory Visit: Payer: Medicare HMO

## 2018-02-07 DIAGNOSIS — B20 Human immunodeficiency virus [HIV] disease: Secondary | ICD-10-CM

## 2018-02-08 LAB — T-HELPER CELL (CD4) - (RCID CLINIC ONLY)
CD4 % Helper T Cell: 15 % — ABNORMAL LOW (ref 33–55)
CD4 T CELL ABS: 360 /uL — AB (ref 400–2700)

## 2018-02-09 LAB — COMPREHENSIVE METABOLIC PANEL
AG RATIO: 1.2 (calc) (ref 1.0–2.5)
ALKALINE PHOSPHATASE (APISO): 153 U/L — AB (ref 33–130)
ALT: 12 U/L (ref 6–29)
AST: 13 U/L (ref 10–35)
Albumin: 4 g/dL (ref 3.6–5.1)
BILIRUBIN TOTAL: 0.3 mg/dL (ref 0.2–1.2)
BUN / CREAT RATIO: 14 (calc) (ref 6–22)
BUN: 19 mg/dL (ref 7–25)
CALCIUM: 9 mg/dL (ref 8.6–10.4)
CHLORIDE: 104 mmol/L (ref 98–110)
CO2: 29 mmol/L (ref 20–32)
Creat: 1.38 mg/dL — ABNORMAL HIGH (ref 0.50–1.05)
GLOBULIN: 3.4 g/dL (ref 1.9–3.7)
Glucose, Bld: 102 mg/dL — ABNORMAL HIGH (ref 65–99)
Potassium: 4.1 mmol/L (ref 3.5–5.3)
Sodium: 141 mmol/L (ref 135–146)
Total Protein: 7.4 g/dL (ref 6.1–8.1)

## 2018-02-09 LAB — CBC
HCT: 42.3 % (ref 35.0–45.0)
HEMOGLOBIN: 14.3 g/dL (ref 11.7–15.5)
MCH: 30.8 pg (ref 27.0–33.0)
MCHC: 33.8 g/dL (ref 32.0–36.0)
MCV: 91.2 fL (ref 80.0–100.0)
MPV: 9.7 fL (ref 7.5–12.5)
Platelets: 300 10*3/uL (ref 140–400)
RBC: 4.64 10*6/uL (ref 3.80–5.10)
RDW: 13.6 % (ref 11.0–15.0)
WBC: 6.7 10*3/uL (ref 3.8–10.8)

## 2018-02-09 LAB — LIPID PANEL
CHOLESTEROL: 184 mg/dL (ref <200)
HDL: 48 mg/dL — AB (ref >50)
LDL CHOLESTEROL (CALC): 113 mg/dL — AB
Non-HDL Cholesterol (Calc): 136 mg/dL (calc) — ABNORMAL HIGH (ref <130)
TRIGLYCERIDES: 118 mg/dL (ref <150)
Total CHOL/HDL Ratio: 3.8 (calc) (ref <5.0)

## 2018-02-09 LAB — HIV-1 RNA QUANT-NO REFLEX-BLD
HIV 1 RNA QUANT: NOT DETECTED {copies}/mL
HIV-1 RNA QUANT, LOG: NOT DETECTED {Log_copies}/mL

## 2018-02-09 LAB — RPR: RPR: NONREACTIVE

## 2018-02-11 ENCOUNTER — Other Ambulatory Visit: Payer: Self-pay | Admitting: Infectious Disease

## 2018-02-11 DIAGNOSIS — E7849 Other hyperlipidemia: Secondary | ICD-10-CM

## 2018-02-11 DIAGNOSIS — I10 Essential (primary) hypertension: Secondary | ICD-10-CM

## 2018-02-15 MED FILL — BIKTARVY 50-200-25 MG TABS: 50-200-25 | 30 days supply | Qty: 30 | Fill #8

## 2018-02-15 MED FILL — DULoxetine HCL 30 MG CPEP: 30 | 30 days supply | Qty: 60 | Fill #2

## 2018-02-15 MED FILL — PRAVASTATIN SODIUM 20 MG TA: 20 | 30 days supply | Qty: 30 | Fill #0

## 2018-02-15 MED FILL — AMLODIPINE BESYLATE 10 MG T: 10 | 30 days supply | Qty: 30 | Fill #0

## 2018-02-15 MED FILL — ALBUTEROL 0.083 MG/ML SOLN: (2.5 MG/3ML | 7 days supply | Qty: 90 | Fill #1

## 2018-02-15 MED FILL — PREGABALIN 75 MG CAPS: 75 | 30 days supply | Qty: 60 | Fill #2

## 2018-02-18 ENCOUNTER — Encounter: Payer: Self-pay | Admitting: Family Medicine

## 2018-02-19 ENCOUNTER — Encounter: Payer: Self-pay | Admitting: Family Medicine

## 2018-02-21 ENCOUNTER — Encounter: Payer: Self-pay | Admitting: Family Medicine

## 2018-02-28 ENCOUNTER — Encounter: Payer: Self-pay | Admitting: Family

## 2018-02-28 ENCOUNTER — Ambulatory Visit (INDEPENDENT_AMBULATORY_CARE_PROVIDER_SITE_OTHER): Payer: Medicare HMO | Admitting: Family

## 2018-02-28 VITALS — BP 149/88 | HR 97 | Temp 98.0°F | Resp 16 | Ht 64.0 in | Wt 172.0 lb

## 2018-02-28 DIAGNOSIS — B20 Human immunodeficiency virus [HIV] disease: Secondary | ICD-10-CM | POA: Diagnosis not present

## 2018-02-28 DIAGNOSIS — Z0000 Encounter for general adult medical examination without abnormal findings: Secondary | ICD-10-CM

## 2018-02-28 DIAGNOSIS — N183 Chronic kidney disease, stage 3 unspecified: Secondary | ICD-10-CM

## 2018-02-28 MED ORDER — BICTEGRAVIR-EMTRICITAB-TENOFOV 50-200-25 MG PO TABS: 1.0000 | ORAL_TABLET | Freq: Every day | ORAL | 6 refills | Status: DC

## 2018-02-28 NOTE — Assessment & Plan Note (Signed)
   All immunizations are up to date per recommendations.  Due for mammogram and Pap smear. Encouraged to schedule appointment with Janene Madeira, NP for well woman care.   Referral to [facility] dental clinic placed for routine dental care.   Discussed importance of safe sexual practice to reduce risk of acquisition and transmission of STI. Declines condoms today.

## 2018-02-28 NOTE — Assessment & Plan Note (Signed)
CKD appears stable with slightly improved creatinine level. Continue to monitor.

## 2018-02-28 NOTE — Patient Instructions (Addendum)
Good to see you!  Please continue to take your [place] as prescribed daily.  Please follow up with primary care to get your mammogram.   Plan for follow up office visit in 6 months or sooner if needed with lab work 1-2 weeks prior to your appointment with either Marya Amsler or Dr. Tommy Medal.

## 2018-02-28 NOTE — Assessment & Plan Note (Signed)
Molly Dillon has well-controlled HIV disease with good adherence and tolerance to her current ART regimen of Biktarvy.  No signs/symptoms of opportunistic infection or progressive HIV disease.  Current symptoms she is experiencing are likely related to her COPD.  No indication for antibiotics at this time.  Continue current dose of Biktarvy.  Follow-up office visit in 6 months or sooner if needed with blood work 1 to 2 weeks prior to appointment.

## 2018-02-28 NOTE — Progress Notes (Signed)
Subjective:    Patient ID: Molly Dillon, female    DOB: Dec 21, 1960, 58 y.o.   MRN: [id number]  Chief Complaint  Patient presents with  . HIV Positive/AIDS     HPI:  Molly Dillon is a 58 y.o. female who presents today for routine follow up of HIV disease.   [name] was last seen in the office on 10/13/2017 for routine follow-up with good adherence and tolerance to her ART regimen of Biktarvy.  CD4 count at the time was 170 and viral load was undetectable.  Health maintenance due includes mammogram and Pap smear.  Ms. Bobst has been taking her Biktarvy as prescribed with no adverse side effects or missed doses.  She remains covered through [facility] and receives her medications from was a long outpatient pharmacy.  Not currently sexually active.  Housing in obtaining food is stable.  She has been having increasing cough recently over the last 1 to 2 weeks.  Denies fevers, chills, night sweats, headaches, changes in vision, neck pain/stiffness, nausea, diarrhea, vomiting, lesions or rashes.    Allergies  Allergen Reactions  . Dapsone Shortness Of Breath  . Penicillins Anaphylaxis    Has patient had a PCN reaction causing immediate rash, facial/tongue/throat swelling, SOB or lightheadedness with hypotension: Yes Has patient had a PCN reaction causing severe rash involving mucus membranes or skin necrosis: Yes Has patient had a PCN reaction that required hospitalization: No Has patient had a PCN reaction occurring within the last 10 years: No If all of the above answers are "NO", then may proceed with Cephalosporin use.       Outpatient Medications Prior to Visit  Medication Sig Dispense Refill  . albuterol (PROVENTIL HFA;VENTOLIN HFA) 108 (90 Base) MCG/ACT inhaler Inhale 2 puffs into the lungs every 6 (six) hours as needed for wheezing. 1 Inhaler 11  . albuterol (PROVENTIL) (2.5 MG/3ML) 0.083% nebulizer solution Take 3 mLs (2.5 mg total) by nebulization every 6 (six)  hours as needed for wheezing or shortness of breath. 75 mL 12  . amLODipine (NORVASC) 10 MG tablet TAKE 1 TABLET (10 MG TOTAL) BY MOUTH DAILY. 30 tablet 0  . buPROPion (WELLBUTRIN SR) 150 MG 12 hr tablet Take 1 tablet (150 mg total) by mouth daily. 30 tablet 1  . CHANTIX CONTINUING MONTH PAK 1 MG tablet TAKE 1 TABLET (1 MG TOTAL) BY MOUTH 2 TIMES DAILY. 56 tablet 0  . diclofenac sodium (VOLTAREN) 1 % GEL Apply 2 g topically 4 (four) times daily as needed (pain).   2  . DULoxetine (CYMBALTA) 30 MG capsule Take 2 capsules (60 mg total) by mouth daily. 60 capsule 2  . Fluticasone-Salmeterol (ADVAIR DISKUS) 250-50 MCG/DOSE AEPB Inhale 1 puff into the lungs 2 (two) times daily. 60 each 11  . gentamicin cream (GARAMYCIN) 0.1 % Apply 1 application topically 3 (three) times daily. 30 g 1  . nicotine (NICODERM CQ - DOSED IN MG/24 HOURS) 21 mg/24hr patch Place 1 patch (21 mg total) onto the skin daily. (Patient taking differently: Place 21 mg onto the skin daily as needed (nicotine dependence). ) 28 patch 0  . pravastatin (PRAVACHOL) 20 MG tablet TAKE 1 TABLET (20 MG TOTAL) BY MOUTH DAILY 30 tablet 0  . pregabalin (LYRICA) 75 MG capsule Take 1 capsule (75 mg total) by mouth 2 (two) times daily. 60 capsule 2  . SPIRIVA HANDIHALER 18 MCG inhalation capsule PLACE 1 CAPSULE INTO THE INHALER AND INHALE DAILY 30 capsule 2  . triamcinolone ointment (  KENALOG) 0.5 % Apply 1 application topically 2 (two) times daily. 30 g 3  . bictegravir-emtricitabine-tenofovir AF (BIKTARVY) 50-200-25 MG TABS tablet Take 1 tablet by mouth daily. 30 tablet 11  . nicotine polacrilex (NICORETTE) 4 MG gum Take 1 each (4 mg total) by mouth as needed for smoking cessation. 100 tablet 0   No facility-administered medications prior to visit.      Past Medical History:  Diagnosis Date  . Acute renal insufficiency 06/06/2014  . Asthma   . Constipation 07/19/2014  . COPD (chronic obstructive pulmonary disease) with acute bronchitis ([place])  06/06/2014  . Depression   . Depression, major, recurrent, moderate ([place]) 04/02/2015  . HIV infection ([place])   . Hyperlipidemia   . Hypertension   . Hypertensive nephropathy 07/19/2014  . Unintentional weight loss 02/13/2015     History reviewed. No pertinent surgical history.     Review of Systems  Constitutional: Negative for appetite change, chills, diaphoresis, fatigue, fever and unexpected weight change.  Eyes:       Negative for acute change in vision  Respiratory: Negative for chest tightness, shortness of breath and wheezing.   Cardiovascular: Negative for chest pain.  Gastrointestinal: Negative for diarrhea, nausea and vomiting.  Genitourinary: Negative for dysuria, pelvic pain and vaginal discharge.  Musculoskeletal: Negative for neck pain and neck stiffness.  Skin: Negative for rash.  Neurological: Negative for seizures, syncope, weakness and headaches.  Hematological: Negative for adenopathy. Does not bruise/bleed easily.  Psychiatric/Behavioral: Negative for hallucinations.      Objective:    BP (!) 149/88   Pulse 97   Temp 98 F (36.7 C)   Resp 16   Ht 5\' 4"  (1.626 m)   Wt 172 lb (78 kg)   LMP 01/12/2011   SpO2 91% Comment: room air  BMI 29.52 kg/m  Nursing note and vital signs reviewed.  Physical Exam Constitutional:      General: She is not in acute distress.    Appearance: She is well-developed.  Eyes:     Conjunctiva/sclera: Conjunctivae normal.  Neck:     Musculoskeletal: Neck supple.  Cardiovascular:     Rate and Rhythm: Normal rate and regular rhythm.     Heart sounds: Normal heart sounds. No murmur. No friction rub. No gallop.   Pulmonary:     Effort: Pulmonary effort is normal. No respiratory distress.     Breath sounds: Normal breath sounds. No wheezing or rales.  Chest:     Chest wall: No tenderness.  Abdominal:     General: Bowel sounds are normal.     Palpations: Abdomen is soft.     Tenderness: There is no abdominal tenderness.    Lymphadenopathy:     Cervical: No cervical adenopathy.  Skin:    General: Skin is warm and dry.     Findings: No rash.  Neurological:     Mental Status: She is alert and oriented to person, place, and time.  Psychiatric:        Behavior: Behavior normal.        Thought Content: Thought content normal.        Judgment: Judgment normal.    Depression screen [facility] 2/9 11/08/2017 06/21/2017 03/23/2017 02/23/2017 01/18/2017  Decreased Interest 0 0 0 0 0  Down, Depressed, Hopeless 0 0 0 0 0  PHQ - 2 Score 0 0 0 0 0  Altered sleeping - - - - -  Tired, decreased energy - - - - -  Change in appetite - - - - -  Feeling bad or failure about yourself  - - - - -  Trouble concentrating - - - - -  Moving slowly or fidgety/restless - - - - -  Suicidal thoughts - - - - -  PHQ-9 Score - - - - -  Difficult doing work/chores - - - - -  Some recent data might be hidden       Assessment & Plan:   Problem List Items Addressed This Visit      Genitourinary   CKD (chronic kidney disease), stage III (HCC) (Chronic)    CKD appears stable with slightly improved creatinine level. Continue to monitor.         Other   Human immunodeficiency virus (HIV) disease ([place]) - Primary (Chronic)    Ms. Kamp has well-controlled HIV disease with good adherence and tolerance to her current ART regimen of Biktarvy.  No signs/symptoms of opportunistic infection or progressive HIV disease.  Current symptoms she is experiencing are likely related to her COPD.  No indication for antibiotics at this time.  Continue current dose of Biktarvy.  Follow-up office visit in 6 months or sooner if needed with blood work 1 to 2 weeks prior to appointment.      Relevant Medications   bictegravir-emtricitabine-tenofovir AF (BIKTARVY) 50-200-25 MG TABS tablet   Other Relevant Orders   T-helper cell (CD4)- ([facility] clinic only)   HIV-1 RNA quant-no reflex-bld   CBC   Comprehensive metabolic panel   Lipid panel   RPR   Health care  maintenance     All immunizations are up to date per recommendations.  Due for mammogram and Pap smear. Encouraged to schedule appointment with Janene Madeira, NP for well woman care.   Referral to [facility] dental clinic placed for routine dental care.   Discussed importance of safe sexual practice to reduce risk of acquisition and transmission of STI. Declines condoms today.           I have discontinued Cassanda Masden's nicotine polacrilex. I am also having her maintain her buPROPion, triamcinolone ointment, albuterol, Fluticasone-Salmeterol, nicotine, diclofenac sodium, gentamicin cream, CHANTIX CONTINUING MONTH PAK, SPIRIVA HANDIHALER, pregabalin, DULoxetine, albuterol, amLODipine, pravastatin, and bictegravir-emtricitabine-tenofovir AF.   Meds ordered this encounter  Medications  . bictegravir-emtricitabine-tenofovir AF (BIKTARVY) 50-200-25 MG TABS tablet    Sig: Take 1 tablet by mouth daily.    Dispense:  30 tablet    Refill:  6    Order Specific Question:   Supervising Provider    Answer:   Carlyle Basques [[id number]]     Follow-up: Return in about 6 months (around 08/29/2018), or if symptoms worsen or fail to improve.   Terri Piedra, MSN, FNP-C Nurse Practitioner [facility] Office phone: [phone] Pager: [place] number: [phone]

## 2018-03-09 ENCOUNTER — Other Ambulatory Visit: Payer: Self-pay | Admitting: Family Medicine

## 2018-03-09 ENCOUNTER — Other Ambulatory Visit: Payer: Self-pay | Admitting: Infectious Disease

## 2018-03-09 DIAGNOSIS — M545 Low back pain, unspecified: Secondary | ICD-10-CM

## 2018-03-09 DIAGNOSIS — I10 Essential (primary) hypertension: Secondary | ICD-10-CM

## 2018-03-09 DIAGNOSIS — E7849 Other hyperlipidemia: Secondary | ICD-10-CM

## 2018-03-14 MED FILL — PREGABALIN 75 MG CAPS: 75 | 30 days supply | Qty: 60 | Fill #0

## 2018-03-14 MED FILL — BIKTARVY 50-200-25 MG TABS: 50-200-25 | 30 days supply | Qty: 30 | Fill #0

## 2018-03-14 MED FILL — AMLODIPINE BESYLATE 10 MG T: 10 | 30 days supply | Qty: 30 | Fill #0

## 2018-03-14 MED FILL — DULoxetine HCL 30 MG CPEP: 30 | 30 days supply | Qty: 60 | Fill #0

## 2018-03-14 MED FILL — ALBUTEROL 0.083 MG/ML SOLN: (2.5 MG/3ML | 7 days supply | Qty: 90 | Fill #2

## 2018-03-14 MED FILL — PRAVASTATIN SODIUM 20 MG TA: 20 | 30 days supply | Qty: 30 | Fill #0

## 2018-04-18 MED FILL — AMLODIPINE BESYLATE 10 MG T: 10 | 30 days supply | Qty: 30 | Fill #1

## 2018-04-18 MED FILL — PREGABALIN 75 MG CAPS: 75 | 30 days supply | Qty: 60 | Fill #1

## 2018-04-18 MED FILL — ALBUTEROL 0.083 MG/ML SOLN: (2.5 MG/3ML | 7 days supply | Qty: 90 | Fill #3

## 2018-04-18 MED FILL — DULoxetine HCL 30 MG CPEP: 30 | 30 days supply | Qty: 60 | Fill #1

## 2018-04-18 MED FILL — BIKTARVY 50-200-25 MG TABS: 50-200-25 | 30 days supply | Qty: 30 | Fill #9

## 2018-04-18 MED FILL — PRAVASTATIN SODIUM 20 MG TA: 20 | 30 days supply | Qty: 30 | Fill #1

## 2018-05-06 ENCOUNTER — Encounter: Payer: Self-pay | Admitting: Family Medicine

## 2018-05-09 ENCOUNTER — Other Ambulatory Visit: Payer: Self-pay | Admitting: Family Medicine

## 2018-05-09 MED ORDER — MOMETASONE FURO-FORMOTEROL FUM 200-5 MCG/ACT IN AERO: 2.0000 | INHALATION_SPRAY | Freq: Two times a day (BID) | RESPIRATORY_TRACT | 0 refills | Status: DC

## 2018-05-09 NOTE — Progress Notes (Signed)
Patient requesting Dulera instead of Advair. Patient was using Dulera during last hospitalization and seems to have helped patient.   Dose confirmed with pharmacy.   Advair discontinued and Dulera RX sent to [facility].   Dalphine Handing, PGY-2 [facility] 05/09/2018 10:08 AM

## 2018-05-10 ENCOUNTER — Other Ambulatory Visit: Payer: Self-pay

## 2018-05-10 MED ORDER — MOMETASONE FURO-FORMOTEROL FUM 200-5 MCG/ACT IN AERO: 2.0000 | INHALATION_SPRAY | Freq: Two times a day (BID) | RESPIRATORY_TRACT | 0 refills | Status: DC

## 2018-05-11 ENCOUNTER — Other Ambulatory Visit: Payer: Self-pay | Admitting: *Deleted

## 2018-05-11 ENCOUNTER — Telehealth: Payer: Self-pay | Admitting: *Deleted

## 2018-05-11 NOTE — Telephone Encounter (Signed)
Pt needs a alternative for dulera, drug not covered by patient plan. Please advise. Deseree Kennon Holter, CMA

## 2018-05-11 NOTE — Telephone Encounter (Signed)
PA needed on Dulera.  Formulary alternatives that must be trialed and failed are Breo Ellipta and Symbicort.  Do not see in chart. Will forward to MD to verify if we should continue PA.  Will need to know why patient would not be a candidate for the above mentioned meds.   Cover My Meds info: Key: [place], CMA

## 2018-05-12 ENCOUNTER — Other Ambulatory Visit: Payer: Self-pay | Admitting: Family Medicine

## 2018-05-12 DIAGNOSIS — J449 Chronic obstructive pulmonary disease, unspecified: Secondary | ICD-10-CM

## 2018-05-12 MED ORDER — FLUTICASONE FUROATE-VILANTEROL 100-25 MCG/INH IN AEPB: 1.0000 | INHALATION_SPRAY | Freq: Every day | RESPIRATORY_TRACT | 0 refills | Status: DC

## 2018-05-12 MED ORDER — FLUTICASONE-SALMETEROL 250-50 MCG/DOSE IN AEPB: 1.0000 | INHALATION_SPRAY | Freq: Every day | RESPIRATORY_TRACT | 2 refills | Status: DC

## 2018-05-12 MED FILL — ADVAIR 250/50 DISKUS: 250-50 | 30 days supply | Qty: 60 | Fill #0

## 2018-05-12 NOTE — Progress Notes (Signed)
Per insurance will need to trial Breo prior to starting dulera. RX for breo sent.   Dalphine Handing, PGY-2 [facility] 05/12/2018 12:45 PM

## 2018-05-12 NOTE — Telephone Encounter (Signed)
Attempted to call pt. No voice mail is set up, unable to LVM. Will try again later on today. Salvatore Marvel, CMA

## 2018-05-12 NOTE — Telephone Encounter (Signed)
Patient previously on Advair. Does not appear to have used either Breo or Symbicort. Will send in alternative.   Dalphine Handing, PGY-2 [facility] 05/12/2018 12:42 PM

## 2018-05-12 NOTE — Telephone Encounter (Signed)
Patient informed. Christen Bame, CMA

## 2018-05-12 NOTE — Telephone Encounter (Signed)
Advair sent to [facility]. Please advise.  Harriet [place], [place], PGY-3

## 2018-05-13 MED FILL — BREO ELLIPTA 100-25 MCG INH: 100-25 | 30 days supply | Qty: 60 | Fill #0

## 2018-05-24 MED FILL — ALBUTEROL 0.083 MG/ML SOLN: (2.5 MG/3ML | 7 days supply | Qty: 90 | Fill #4

## 2018-05-24 MED FILL — PRAVASTATIN SODIUM 20 MG TA: 20 | 30 days supply | Qty: 30 | Fill #2

## 2018-05-24 MED FILL — PREGABALIN 75 MG CAPS: 75 | 30 days supply | Qty: 60 | Fill #2

## 2018-05-24 MED FILL — DULoxetine HCL 30 MG CPEP: 30 | 30 days supply | Qty: 60 | Fill #2

## 2018-05-24 MED FILL — BIKTARVY 50-200-25 MG TABS: 50-200-25 | 30 days supply | Qty: 30 | Fill #10

## 2018-05-24 MED FILL — AMLODIPINE BESYLATE 10 MG T: 10 | 30 days supply | Qty: 30 | Fill #2

## 2018-06-03 ENCOUNTER — Other Ambulatory Visit: Payer: Self-pay | Admitting: Pharmacist

## 2018-06-08 ENCOUNTER — Encounter: Payer: Self-pay | Admitting: Podiatry

## 2018-06-13 ENCOUNTER — Encounter: Payer: Self-pay | Admitting: Family Medicine

## 2018-06-14 ENCOUNTER — Encounter: Payer: Self-pay | Admitting: Family Medicine

## 2018-06-22 MED FILL — BIKTARVY 50-200-25 MG TABS: 50-200-25 | 30 days supply | Qty: 30 | Fill #1

## 2018-07-01 ENCOUNTER — Other Ambulatory Visit: Payer: Self-pay | Admitting: Family Medicine

## 2018-07-01 DIAGNOSIS — M545 Low back pain, unspecified: Secondary | ICD-10-CM

## 2018-07-01 MED FILL — PREGABALIN 75 MG CAPS: 75 | 30 days supply | Qty: 60 | Fill #0

## 2018-07-01 MED FILL — DULoxetine HCL 30 MG CPEP: 30 | 30 days supply | Qty: 60 | Fill #0

## 2018-07-01 MED FILL — ALBUTEROL 0.083 MG/ML SOLN: (2.5 MG/3ML | 7 days supply | Qty: 90 | Fill #5

## 2018-07-01 MED FILL — PRAVASTATIN SODIUM 20 MG TA: 20 | 30 days supply | Qty: 30 | Fill #3

## 2018-07-01 MED FILL — AMLODIPINE BESYLATE 10 MG T: 10 | 30 days supply | Qty: 30 | Fill #3

## 2018-07-04 ENCOUNTER — Other Ambulatory Visit: Payer: Self-pay | Admitting: Podiatry

## 2018-07-04 ENCOUNTER — Ambulatory Visit (INDEPENDENT_AMBULATORY_CARE_PROVIDER_SITE_OTHER): Payer: Medicare HMO | Admitting: Podiatry

## 2018-07-04 ENCOUNTER — Other Ambulatory Visit: Payer: Self-pay

## 2018-07-04 ENCOUNTER — Encounter: Payer: Self-pay | Admitting: Podiatry

## 2018-07-04 ENCOUNTER — Ambulatory Visit (INDEPENDENT_AMBULATORY_CARE_PROVIDER_SITE_OTHER): Payer: Medicare HMO

## 2018-07-04 DIAGNOSIS — L989 Disorder of the skin and subcutaneous tissue, unspecified: Secondary | ICD-10-CM | POA: Diagnosis not present

## 2018-07-04 DIAGNOSIS — M79672 Pain in left foot: Secondary | ICD-10-CM

## 2018-07-04 DIAGNOSIS — M79676 Pain in unspecified toe(s): Secondary | ICD-10-CM

## 2018-07-04 DIAGNOSIS — B351 Tinea unguium: Secondary | ICD-10-CM | POA: Diagnosis not present

## 2018-07-06 NOTE — Progress Notes (Signed)
    Subjective: Patient is a 58 y.o. female presenting to the office today with a chief complaint of painful callus lesions noted to the bilateral feet x 5 that have been present for the past few years. Walking and bearing weight increases the pain. She has not done anything at home for treatment.  Patient also complains of elongated, thickened nails that cause pain while ambulating in shoes. She is unable to trim her own nails. Patient presents today for further treatment and evaluation. Patient is status post bunionectomy and hammertoe repair of digits 2-5 left. DOS: 06/24/2017. She states the foot has not healed properly and she still experiences burning pain if on her feet for long periods of time. She has not done anything for treatment. Patient is here for further evaluation and treatment.   Past Medical History:  Diagnosis Date  . Acute renal insufficiency 06/06/2014  . Asthma   . Constipation 07/19/2014  . COPD (chronic obstructive pulmonary disease) with acute bronchitis ([place]) 06/06/2014  . Depression   . Depression, major, recurrent, moderate ([place]) 04/02/2015  . HIV infection ([place])   . Hyperlipidemia   . Hypertension   . Hypertensive nephropathy 07/19/2014  . Unintentional weight loss 02/13/2015    Objective:  Physical Exam General: Alert and oriented x3 in no acute distress  Dermatology: Hyperkeratotic lesions present on the bilateral feet x 5. Pain on palpation with a central nucleated core noted. Skin is warm, dry and supple bilateral lower extremities. Negative for open lesions or macerations. Nails are tender, long, thickened and dystrophic with subungual debris, consistent with onychomycosis, 1-5 bilateral. No signs of infection noted.  Vascular: Palpable pedal pulses bilaterally. No edema or erythema noted. Capillary refill within normal limits.  Neurological: Epicritic and protective threshold grossly intact bilaterally.   Musculoskeletal Exam: Pain on palpation at the  keratotic lesion noted. Range of motion within normal limits bilateral. Muscle strength 5/5 in all groups bilateral.  Radiographic Exam:  Orthopedic hardware and osteotomies sites appear to be stable with routine healing.  Assessment: 1. Onychodystrophic nails 1-5 bilateral with hyperkeratosis of nails.  2. Onychomycosis of nail due to dermatophyte bilateral 3. Porokeratosis noted to the bilateral feet x 5 4. S/p bunionectomy and hammertoe repair digits 2-5 left DOS 06/24/2017   Plan of Care:  1. Patient evaluated. X-Rays reviewed.  2. Excisional debridement of keratoic lesion using a chisel blade was performed without incident.  3. Dressed with light dressing. 4. Mechanical debridement of nails 1-5 bilaterally performed using a nail nipper. Filed with dremel without incident.  5. Patient is to return to the clinic as needed to discuss surgery on right foot.   Edrick Kins, DPM [facility]  Dr. Edrick Kins, [address]                                        [place], [place] [address]                Office [phone]  Fax [phone]

## 2018-07-21 MED FILL — BIKTARVY 50-200-25 MG TABS: 50-200-25 | 30 days supply | Qty: 30 | Fill #2

## 2018-07-25 MED FILL — AMLODIPINE BESYLATE 10 MG T: 10 | 30 days supply | Qty: 30 | Fill #4

## 2018-07-25 MED FILL — PREGABALIN 75 MG CAPS: 75 | 30 days supply | Qty: 60 | Fill #1

## 2018-07-25 MED FILL — DULoxetine HCL 30 MG CPEP: 30 | 30 days supply | Qty: 60 | Fill #1

## 2018-07-25 MED FILL — ALBUTEROL 0.083 MG/ML SOLN: (2.5 MG/3ML | 7 days supply | Qty: 90 | Fill #6

## 2018-07-25 MED FILL — PRAVASTATIN SODIUM 20 MG TA: 20 | 30 days supply | Qty: 30 | Fill #4

## 2018-08-25 MED FILL — PRAVASTATIN SODIUM 20 MG TA: 20 | 30 days supply | Qty: 30 | Fill #5

## 2018-08-25 MED FILL — BIKTARVY 50-200-25 MG TABS: 50-200-25 | 30 days supply | Qty: 30 | Fill #3

## 2018-08-25 MED FILL — PREGABALIN 75 MG CAPS: 75 | 30 days supply | Qty: 60 | Fill #2

## 2018-08-25 MED FILL — AMLODIPINE BESYLATE 10 MG T: 10 | 30 days supply | Qty: 30 | Fill #5

## 2018-08-25 MED FILL — ALBUTEROL 0.083 MG/ML SOLN: (2.5 MG/3ML | 7 days supply | Qty: 90 | Fill #7

## 2018-08-25 MED FILL — DULoxetine HCL 30 MG CPEP: 30 | 30 days supply | Qty: 60 | Fill #2

## 2018-08-29 ENCOUNTER — Other Ambulatory Visit: Payer: Medicare HMO

## 2018-08-29 ENCOUNTER — Encounter: Payer: Self-pay | Admitting: Family Medicine

## 2018-08-29 ENCOUNTER — Other Ambulatory Visit: Payer: Self-pay

## 2018-08-29 DIAGNOSIS — B20 Human immunodeficiency virus [HIV] disease: Secondary | ICD-10-CM

## 2018-08-29 DIAGNOSIS — Z113 Encounter for screening for infections with a predominantly sexual mode of transmission: Secondary | ICD-10-CM

## 2018-08-29 DIAGNOSIS — E785 Hyperlipidemia, unspecified: Secondary | ICD-10-CM

## 2018-08-30 LAB — T-HELPER CELL (CD4) - (RCID CLINIC ONLY)
CD4 % Helper T Cell: 16 % — ABNORMAL LOW (ref 33–65)
CD4 T Cell Abs: 393 /uL — ABNORMAL LOW (ref 400–1790)

## 2018-08-31 ENCOUNTER — Encounter: Payer: Self-pay | Admitting: Family Medicine

## 2018-09-01 ENCOUNTER — Encounter: Payer: Self-pay | Admitting: Family Medicine

## 2018-09-01 LAB — CBC
HCT: 47 % — ABNORMAL HIGH (ref 35.0–45.0)
Hemoglobin: 15.7 g/dL — ABNORMAL HIGH (ref 11.7–15.5)
MCH: 31.2 pg (ref 27.0–33.0)
MCHC: 33.4 g/dL (ref 32.0–36.0)
MCV: 93.3 fL (ref 80.0–100.0)
MPV: 9.7 fL (ref 7.5–12.5)
Platelets: 308 10*3/uL (ref 140–400)
RBC: 5.04 10*6/uL (ref 3.80–5.10)
RDW: 14.2 % (ref 11.0–15.0)
WBC: 6.1 10*3/uL (ref 3.8–10.8)

## 2018-09-01 LAB — COMPREHENSIVE METABOLIC PANEL
AG Ratio: 1.3 (calc) (ref 1.0–2.5)
ALT: 15 U/L (ref 6–29)
AST: 16 U/L (ref 10–35)
Albumin: 4.1 g/dL (ref 3.6–5.1)
Alkaline phosphatase (APISO): 135 U/L (ref 37–153)
BUN/Creatinine Ratio: 14 (calc) (ref 6–22)
BUN: 19 mg/dL (ref 7–25)
CO2: 32 mmol/L (ref 20–32)
Calcium: 9.7 mg/dL (ref 8.6–10.4)
Chloride: 107 mmol/L (ref 98–110)
Creat: 1.34 mg/dL — ABNORMAL HIGH (ref 0.50–1.05)
Globulin: 3.2 g/dL (calc) (ref 1.9–3.7)
Glucose, Bld: 96 mg/dL (ref 65–99)
Potassium: 5.1 mmol/L (ref 3.5–5.3)
Sodium: 144 mmol/L (ref 135–146)
Total Bilirubin: 0.3 mg/dL (ref 0.2–1.2)
Total Protein: 7.3 g/dL (ref 6.1–8.1)

## 2018-09-01 LAB — LIPID PANEL
Cholesterol: 204 mg/dL — ABNORMAL HIGH (ref <200)
HDL: 46 mg/dL — ABNORMAL LOW (ref >=50)
LDL Cholesterol (Calc): 124 mg/dL (calc) — ABNORMAL HIGH
Non-HDL Cholesterol (Calc): 158 mg/dL (calc) — ABNORMAL HIGH (ref <130)
Total CHOL/HDL Ratio: 4.4 (calc) (ref <5.0)
Triglycerides: 222 mg/dL — ABNORMAL HIGH (ref <150)

## 2018-09-01 LAB — HIV-1 RNA QUANT-NO REFLEX-BLD
HIV 1 RNA Quant: <20 copies/mL
HIV-1 RNA Quant, Log: <1.3 Log copies/mL

## 2018-09-01 LAB — RPR: RPR Ser Ql: NONREACTIVE

## 2018-09-02 ENCOUNTER — Encounter: Payer: Self-pay | Admitting: Family Medicine

## 2018-09-05 ENCOUNTER — Telehealth: Payer: Self-pay

## 2018-09-05 NOTE — Telephone Encounter (Signed)
LVM for patient to schedule virtual video visit per Dr. Janus Molder.  Ozella Almond, [place]

## 2018-09-12 ENCOUNTER — Encounter: Payer: Medicare HMO | Admitting: Infectious Disease

## 2018-09-20 ENCOUNTER — Encounter: Payer: Self-pay | Admitting: Infectious Disease

## 2018-09-20 ENCOUNTER — Other Ambulatory Visit: Payer: Self-pay

## 2018-09-20 ENCOUNTER — Ambulatory Visit (INDEPENDENT_AMBULATORY_CARE_PROVIDER_SITE_OTHER): Payer: Medicare HMO | Admitting: Infectious Disease

## 2018-09-20 DIAGNOSIS — I10 Essential (primary) hypertension: Secondary | ICD-10-CM | POA: Diagnosis not present

## 2018-09-20 DIAGNOSIS — M545 Low back pain, unspecified: Secondary | ICD-10-CM

## 2018-09-20 DIAGNOSIS — F331 Major depressive disorder, recurrent, moderate: Secondary | ICD-10-CM

## 2018-09-20 DIAGNOSIS — E782 Mixed hyperlipidemia: Secondary | ICD-10-CM

## 2018-09-20 DIAGNOSIS — N183 Chronic kidney disease, stage 3 unspecified: Secondary | ICD-10-CM

## 2018-09-20 DIAGNOSIS — J449 Chronic obstructive pulmonary disease, unspecified: Secondary | ICD-10-CM | POA: Diagnosis not present

## 2018-09-20 DIAGNOSIS — F17200 Nicotine dependence, unspecified, uncomplicated: Secondary | ICD-10-CM

## 2018-09-20 DIAGNOSIS — B20 Human immunodeficiency virus [HIV] disease: Secondary | ICD-10-CM

## 2018-09-20 MED ORDER — BIKTARVY 50-200-25 MG PO TABS: 1.0000 | ORAL_TABLET | Freq: Every day | ORAL | 11 refills | Status: DC

## 2018-09-20 MED ORDER — ALBUTEROL SULFATE HFA 108 (90 BASE) MCG/ACT IN AERS: 2.0000 | INHALATION_SPRAY | Freq: Four times a day (QID) | RESPIRATORY_TRACT | 11 refills | Status: DC | PRN

## 2018-09-20 MED ORDER — ALBUTEROL SULFATE (2.5 MG/3ML) 0.083% IN NEBU: 2.5000 mg | INHALATION_SOLUTION | Freq: Four times a day (QID) | RESPIRATORY_TRACT | 12 refills | Status: DC | PRN

## 2018-09-20 MED FILL — ALBUTEROL SULFATE HFA 108 (: 108 (90 BAS | 25 days supply | Qty: 18 | Fill #0

## 2018-09-20 NOTE — Progress Notes (Signed)
Virtual Visit via Telephone Note  I connected with Molly Dillon on 09/20/18 at 10:15 AM EDT by telephone and verified that I am speaking with the correct person using two identifiers.  Location: Patient: Home Provider: Home   I discussed the limitations, risks, security and privacy concerns of performing an evaluation and management service by telephone and the availability of in person appointments. I also discussed with the patient that there may be a patient responsible charge related to this service. The patient expressed understanding and agreed to proceed.   Chief complaint: She is feeling well but asked if I could call in a prescription for a metered-dose inhaler for her asthma  History of Present Illness:  58 year old African-American woman living with HIV disease that is been perfectly controlled most recently with Biktarvy.  She does have comorbid hypertension smoking asthma COPD and depression.  She is being followed by the internal medicine clinic.  She is due for a Pap smear.  She also needs a flu shot and a pneumonia vaccine.  She is trying to stop smoking but has been unsuccessful so far.  She is going to try to get a nicotine patch or to her preference and gum.     Observations/Objective:  58 year old lady living with HIV disease that is very well controlled on Biktarvy but with multiple other comorbid conditions that need to be more optimally managed, most difficulties being her continued smoking  Assessment and Plan:  HIV disease: Continue Biktarvy and she can come back to see me in 1 year  She should get a flu shot this year as well as her pneumonia shot.  Need for Pap smear I would like to have her seen by Janene Madeira for Pap smear  Hypertension on amlodipine  Depression taking Cymbalta  Hyperlipidemia on Pravachol  COPD: She is on albuterol metered-dose inhaler as well as fluticasone Vilanterol combination inhaler.  She also asked me to send an  albuterol MDI which I have done  Smoking I have encouraged her to stop smoking pointing out specifically the short-term high risk to her from coronavirus 2019  Follow Up Instructions:    I discussed the assessment and treatment plan with the patient. The patient was provided an opportunity to ask questions and all were answered. The patient agreed with the plan and demonstrated an understanding of the instructions.   The patient was advised to call back or seek an in-person evaluation if the symptoms worsen or if the condition fails to improve as anticipated.  I provided 22 minutes of non-face-to-face time during this encounter.   Alcide Evener, MD

## 2018-09-21 MED ORDER — DULOXETINE HCL 30 MG PO CPEP: 60.0000 mg | ORAL_CAPSULE | Freq: Every day | ORAL | 2 refills | Status: DC

## 2018-09-28 MED FILL — DULoxetine HCL 30 MG CPEP: 30 | 30 days supply | Qty: 60 | Fill #0

## 2018-09-28 MED FILL — PRAVASTATIN SODIUM 20 MG TA: 20 | 30 days supply | Qty: 30 | Fill #6

## 2018-09-28 MED FILL — AMLODIPINE BESYLATE 10 MG T: 10 | 30 days supply | Qty: 30 | Fill #6

## 2018-09-28 MED FILL — BIKTARVY 50-200-25 MG TABS: 50-200-25 | 30 days supply | Qty: 30 | Fill #4

## 2018-09-28 MED FILL — ALBUTEROL 0.083 MG/ML SOLN: (2.5 MG/3ML | 7 days supply | Qty: 90 | Fill #8

## 2018-09-29 ENCOUNTER — Other Ambulatory Visit: Payer: Self-pay | Admitting: Infectious Diseases

## 2018-09-29 ENCOUNTER — Other Ambulatory Visit: Payer: Self-pay

## 2018-09-29 ENCOUNTER — Other Ambulatory Visit (HOSPITAL_COMMUNITY)
Admission: RE | Admit: 2018-09-29 | Discharge: 2018-09-29 | Disposition: A | Discharge status: Home / Self Care | Payer: Medicare HMO | Source: Ambulatory Visit | Attending: Infectious Diseases | Admitting: Infectious Diseases

## 2018-09-29 ENCOUNTER — Encounter: Payer: Self-pay | Admitting: Infectious Diseases

## 2018-09-29 ENCOUNTER — Ambulatory Visit (INDEPENDENT_AMBULATORY_CARE_PROVIDER_SITE_OTHER): Payer: Medicare HMO | Admitting: Infectious Diseases

## 2018-09-29 DIAGNOSIS — Z1151 Encounter for screening for human papillomavirus (HPV): Secondary | ICD-10-CM | POA: Diagnosis not present

## 2018-09-29 DIAGNOSIS — B20 Human immunodeficiency virus [HIV] disease: Secondary | ICD-10-CM

## 2018-09-29 DIAGNOSIS — Z23 Encounter for immunization: Secondary | ICD-10-CM

## 2018-09-29 DIAGNOSIS — Z0000 Encounter for general adult medical examination without abnormal findings: Secondary | ICD-10-CM

## 2018-09-29 DIAGNOSIS — Z124 Encounter for screening for malignant neoplasm of cervix: Secondary | ICD-10-CM | POA: Insufficient documentation

## 2018-09-29 NOTE — Assessment & Plan Note (Signed)
Molly Dillon was seen today for pelvic exam and HPV screening. Pelvic exam was completed with normal findings. Discussed recommendations for HPV screening frequency in women living with HIV.

## 2018-09-29 NOTE — Assessment & Plan Note (Addendum)
Molly Dillon has well-controlled HIV disease with undetectable viral load and CD4 count of 393 as of 08/29/18. She has no complaints today regarding her HIV. She is to continue her Biktarvy daily. She reports good adherence.

## 2018-09-29 NOTE — Patient Instructions (Signed)
Please call the [facility] to set up your mammogram.  [phone] [address] [place], [place] [address]

## 2018-09-29 NOTE — Progress Notes (Signed)
Patient: Molly Dillon  DOB: May 23, 1960 MRN: [id number] PCP: Molly Fee, DO   Annual Pap Smear and Pelvic Exam   Patient Active Problem List   Diagnosis Date Noted  . Screening for cervical cancer 09/29/2018  . Health care maintenance 02/28/2018  . AKI (acute kidney injury) ([place])   . CKD (chronic kidney disease), stage III ([place]) 02/22/2017  . Chronic pain disorder 03/22/2016  . Depression, major, recurrent, moderate ([place]) 04/02/2015  . COPD with asthma ([place]) 06/06/2014  . Tobacco use disorder 06/15/2013  . Allergic rhinitis 05/09/2009  . Mixed hyperlipidemia 05/03/2008  . Human immunodeficiency virus (HIV) disease ([place]) 01/26/2006  . Primary hypertension 01/26/2006     Subjective:  Molly Dillon is a 58 y.o. female being seen today for annual pelvic and pap smear. She has no complaints today. Denies dysuria, pain with intercourse, vaginal discharge, or problems using the restroom. She reports history of an abnormal pap smear in the past. She also reports no history of having a mammogram completed. Denies family history of breast cancer.   Review of Systems  Constitutional: Negative.  Negative for chills and fever.  Respiratory: Negative.   Cardiovascular: Negative.   Genitourinary: Negative.  Negative for dysuria, frequency and urgency.  Skin: Negative.     Past Medical History:  Diagnosis Date  . Acute renal insufficiency 06/06/2014  . Asthma   . Constipation 07/19/2014  . COPD (chronic obstructive pulmonary disease) with acute bronchitis ([place]) 06/06/2014  . Depression   . Depression, major, recurrent, moderate ([place]) 04/02/2015  . HIV infection ([place])   . Hyperlipidemia   . Hypertension   . Hypertensive nephropathy 07/19/2014  . Unintentional weight loss 02/13/2015    Outpatient Medications Prior to Visit  Medication Sig Dispense Refill  . albuterol (PROVENTIL) (2.5 MG/3ML) 0.083% nebulizer solution Take 3 mLs (2.5 mg total) by nebulization every 6 (six) hours  as needed for wheezing or shortness of breath. 75 mL 12  . albuterol (VENTOLIN HFA) 108 (90 Base) MCG/ACT inhaler Inhale 2 puffs into the lungs every 6 (six) hours as needed for wheezing. 18 g 11  . amLODipine (NORVASC) 10 MG tablet TAKE 1 TABLET BY MOUTH ONCE A DAY 30 tablet 6  . bictegravir-emtricitabine-tenofovir AF (BIKTARVY) 50-200-25 MG TABS tablet Take 1 tablet by mouth daily. 30 tablet 11  . CHANTIX CONTINUING MONTH PAK 1 MG tablet TAKE 1 TABLET (1 MG TOTAL) BY MOUTH 2 TIMES DAILY. 56 tablet 0  . diclofenac sodium (VOLTAREN) 1 % GEL Apply 2 g topically 4 (four) times daily as needed (pain).   2  . DULoxetine (CYMBALTA) 30 MG capsule Take 2 capsules (60 mg total) by mouth daily. 60 capsule 2  . fluticasone furoate-vilanterol (BREO ELLIPTA) 100-25 MCG/INH AEPB Inhale 1 puff into the lungs daily. 28 each 0  . gentamicin cream (GARAMYCIN) 0.1 % Apply 1 application topically 3 (three) times daily. 30 g 1  . nicotine (NICODERM CQ - DOSED IN MG/24 HOURS) 21 mg/24hr patch Place 1 patch (21 mg total) onto the skin daily. (Patient taking differently: Place 21 mg onto the skin daily as needed (nicotine dependence). ) 28 patch 0  . pravastatin (PRAVACHOL) 20 MG tablet TAKE 1 TABLET (20 MG TOTAL) BY MOUTH DAILY 30 tablet 6  . pregabalin (LYRICA) 75 MG capsule TAKE 1 CAPSULE (75 MG TOTAL) BY MOUTH 2 (TWO) TIMES DAILY. 60 capsule 2  . SPIRIVA HANDIHALER 18 MCG inhalation capsule PLACE 1 CAPSULE INTO THE INHALER AND INHALE DAILY 30  capsule 2  . triamcinolone ointment (KENALOG) 0.5 % Apply 1 application topically 2 (two) times daily. 30 g 3   No facility-administered medications prior to visit.      Allergies  Allergen Reactions  . Dapsone Shortness Of Breath  . Penicillins Anaphylaxis    Has patient had a PCN reaction causing immediate rash, facial/tongue/throat swelling, SOB or lightheadedness with hypotension: Yes Has patient had a PCN reaction causing severe rash involving mucus membranes or skin  necrosis: Yes Has patient had a PCN reaction that required hospitalization: No Has patient had a PCN reaction occurring within the last 10 years: No If all of the above answers are "NO", then may proceed with Cephalosporin use.     Social History   Tobacco Use  . Smoking status: Former Smoker    Packs/day: 0.50    Years: 36.00    Pack years: 18.00    Types: Cigarettes    Start date: 01/13/1980  . Smokeless tobacco: Never Used  . Tobacco comment: down to 5 cigs a day  Substance Use Topics  . Alcohol use: No    Alcohol/week: 0.0 standard drinks    Frequency: Never  . Drug use: No     Objective:  There were no vitals filed for this visit. There is no height or weight on file to calculate BMI.  Physical Exam Exam conducted with a chaperone present.  Constitutional:      Appearance: Normal appearance.  Pulmonary:     Effort: Pulmonary effort is normal.  Genitourinary:    General: Normal vulva.     Exam position: Lithotomy position.     Pubic Area: No rash.      Vagina: Normal. No vaginal discharge, erythema, tenderness or lesions.     Cervix: Normal.  Neurological:     Mental Status: She is alert.  Psychiatric:        Mood and Affect: Mood normal.        Behavior: Behavior normal.     Lab Results: Lab Results  Component Value Date   WBC 6.1 08/29/2018   HGB 15.7 (H) 08/29/2018   HCT 47.0 (H) 08/29/2018   MCV 93.3 08/29/2018   PLT 308 08/29/2018    Lab Results  Component Value Date   CREATININE 1.34 (H) 08/29/2018   BUN 19 08/29/2018   NA 144 08/29/2018   K 5.1 08/29/2018   CL 107 08/29/2018   CO2 32 08/29/2018    Lab Results  Component Value Date   ALT 15 08/29/2018   AST 16 08/29/2018   ALKPHOS 97 11/13/2015   BILITOT 0.3 08/29/2018     Assessment & Plan:   Problem List Items Addressed This Visit      Unprioritized   Human immunodeficiency virus (HIV) disease ([place]) - Primary (Chronic)    Alvita has well-controlled HIV disease with  undetectable viral load and CD4 count of 393 as of 08/29/18. She has no complaints today regarding her HIV. She is to continue her Biktarvy daily. She reports good adherence.       Health care maintenance    She has not had a mammogram completed, but does report she does self breast exams and is aware to also pay attention to the appearance of her breasts. I did recommend for her to have a mammogram completed, regardless of no family history of breast cancer. She is willing to do this and will allow me to help her get an appointment set up.  Screening for cervical cancer    Renee was seen today for pelvic exam and HPV screening. Pelvic exam was completed with normal findings. Discussed recommendations for HPV screening frequency in women living with HIV.       Relevant Orders   Urine cytology ancillary only   Cytology - PAP( [facility])     Janene Madeira, MSN, NP-C [facility] Pager: [phone] Office: [phone]  09/29/18  1:57 PM

## 2018-09-29 NOTE — Assessment & Plan Note (Addendum)
She has not had a mammogram completed, but does report she does self breast exams and is aware to also pay attention to the appearance of her breasts. I did recommend for her to have a mammogram completed, regardless of no family history of breast cancer. She is willing to do this and will allow me to help her get an appointment set up.

## 2018-10-01 LAB — URINE CYTOLOGY ANCILLARY ONLY
Chlamydia: NEGATIVE
Neisseria Gonorrhea: NEGATIVE

## 2018-10-10 LAB — CYTOLOGY - PAP
Diagnosis: NEGATIVE
High risk HPV: NEGATIVE
Molecular Disclaimer: 56
Molecular Disclaimer: NORMAL

## 2018-10-13 DIAGNOSIS — R32 Unspecified urinary incontinence: Secondary | ICD-10-CM | POA: Diagnosis not present

## 2018-10-17 NOTE — Progress Notes (Signed)
Pap smear results are normal with negative HPV.  She would be a good candidate to continue indefinite screening at a 3 year interval given her serial negative test results and good control of her HIV.  I communicated these results to the patient via MyChart message.  I will adjust her health maintenance item to serve as a reminder for her and her MyChart.

## 2018-10-17 NOTE — Telephone Encounter (Signed)
Please advise on pap results. Thank you! Landis Gandy, RN

## 2018-10-26 ENCOUNTER — Other Ambulatory Visit: Payer: Self-pay | Admitting: Infectious Disease

## 2018-10-26 DIAGNOSIS — I10 Essential (primary) hypertension: Secondary | ICD-10-CM

## 2018-10-26 DIAGNOSIS — E7849 Other hyperlipidemia: Secondary | ICD-10-CM

## 2018-10-28 MED FILL — DULoxetine HCL 30 MG CPEP: 30 | 30 days supply | Qty: 60 | Fill #1

## 2018-10-28 MED FILL — PRAVASTATIN SODIUM 20 MG TA: 20 | 30 days supply | Qty: 30 | Fill #0

## 2018-10-28 MED FILL — ALBUTEROL 0.083 MG/ML SOLN: (2.5 MG/3ML | 7 days supply | Qty: 90 | Fill #9

## 2018-10-28 MED FILL — BIKTARVY 50-200-25 MG TABS: 50-200-25 | 30 days supply | Qty: 30 | Fill #5

## 2018-10-28 MED FILL — AMLODIPINE BESYLATE 10 MG T: 10 | 30 days supply | Qty: 30 | Fill #0

## 2018-10-31 DIAGNOSIS — M545 Low back pain: Secondary | ICD-10-CM | POA: Diagnosis not present

## 2018-10-31 DIAGNOSIS — G894 Chronic pain syndrome: Secondary | ICD-10-CM | POA: Diagnosis not present

## 2018-10-31 DIAGNOSIS — M25569 Pain in unspecified knee: Secondary | ICD-10-CM | POA: Diagnosis not present

## 2018-10-31 DIAGNOSIS — Z79891 Long term (current) use of opiate analgesic: Secondary | ICD-10-CM | POA: Diagnosis not present

## 2018-11-05 DIAGNOSIS — J449 Chronic obstructive pulmonary disease, unspecified: Secondary | ICD-10-CM | POA: Diagnosis not present

## 2018-11-05 DIAGNOSIS — J9601 Acute respiratory failure with hypoxia: Secondary | ICD-10-CM | POA: Diagnosis not present

## 2018-11-05 DIAGNOSIS — J441 Chronic obstructive pulmonary disease with (acute) exacerbation: Secondary | ICD-10-CM | POA: Diagnosis not present

## 2018-11-10 DIAGNOSIS — J449 Chronic obstructive pulmonary disease, unspecified: Secondary | ICD-10-CM | POA: Diagnosis not present

## 2018-11-10 DIAGNOSIS — J441 Chronic obstructive pulmonary disease with (acute) exacerbation: Secondary | ICD-10-CM | POA: Diagnosis not present

## 2018-11-10 DIAGNOSIS — J9601 Acute respiratory failure with hypoxia: Secondary | ICD-10-CM | POA: Diagnosis not present

## 2018-11-13 DIAGNOSIS — R32 Unspecified urinary incontinence: Secondary | ICD-10-CM | POA: Diagnosis not present

## 2018-11-24 MED FILL — BIKTARVY 50-200-25 MG TABS: 50-200-25 | 30 days supply | Qty: 30 | Fill #6

## 2018-11-24 MED FILL — ALBUTEROL 0.083 MG/ML SOLN: (2.5 MG/3ML | 7 days supply | Qty: 90 | Fill #10

## 2018-11-24 MED FILL — DULoxetine HCL 30 MG CPEP: 30 | 30 days supply | Qty: 60 | Fill #2

## 2018-11-24 MED FILL — AMLODIPINE BESYLATE 10 MG T: 10 | 30 days supply | Qty: 30 | Fill #1

## 2018-11-24 MED FILL — PRAVASTATIN SODIUM 20 MG TA: 20 | 30 days supply | Qty: 30 | Fill #1

## 2018-11-28 DIAGNOSIS — Z79891 Long term (current) use of opiate analgesic: Secondary | ICD-10-CM | POA: Diagnosis not present

## 2018-11-28 DIAGNOSIS — M25569 Pain in unspecified knee: Secondary | ICD-10-CM | POA: Diagnosis not present

## 2018-11-28 DIAGNOSIS — G894 Chronic pain syndrome: Secondary | ICD-10-CM | POA: Diagnosis not present

## 2018-11-28 DIAGNOSIS — M545 Low back pain: Secondary | ICD-10-CM | POA: Diagnosis not present

## 2018-12-02 ENCOUNTER — Telehealth (INDEPENDENT_AMBULATORY_CARE_PROVIDER_SITE_OTHER): Payer: Medicare HMO | Admitting: Family Medicine

## 2018-12-02 ENCOUNTER — Other Ambulatory Visit: Payer: Self-pay

## 2018-12-02 ENCOUNTER — Encounter: Payer: Self-pay | Admitting: Family Medicine

## 2018-12-02 VITALS — BP 120/80 | Temp 97.0°F

## 2018-12-02 DIAGNOSIS — Z0271 Encounter for disability determination: Secondary | ICD-10-CM

## 2018-12-02 NOTE — Progress Notes (Signed)
[facility] Telemedicine Visit  Patient consented to have virtual visit. Method of visit: Video  Encounter participants: Patient: Molly Dillon - located at home Provider: Gladys Damme - located at [facility]  Chief Complaint: request for handicap placard DMV form  HPI: Patient would like a renewal of her handicap placard from the [employer].  Patient has COPD and has been hospitalized with multiple exacerbations in the past.  Her last hospitalization was in September 2019, during that visit she did desaturate with ambulation, but no home oxygen was prescribed by the family medicine program.  She may have oxygen prescribed by someone else.  She notes that she cannot walk 200 feet by herself, and is reliant on another person to walk.  She may have an FEV1 less than 1 L due to her COPD; however, she does not have PFTs recorded that I can find.  She is not blind.  ROS: per HPI  Pertinent PMHx: COPD stage D  Exam:  Respiratory: Normal work of breathing and witnessed on video  Assessment/Plan: Based on patient's COPD Gold classification as stage D, and her responses that she cannot walk 200 feet herself, and she relies on another person to help her walk at all times, I affirmed her request for a permanent placard.  Patient reports that she uses oxygen, however we have not prescribed it, therefore I cannot say definitively that she does use it.  Of note, during her last admission for COPD exacerbation in September 2019, it was noted that patient did meet  oxygen requirements in the attestation of her discharge summary.  If the DMV does not accept this attestation, I recommended that she come in for a visit and we can ambulate her with pulse ox to see if she requires oxygen.  I also recommended that she get PFTs as these are needed needed anyway for her COPD.  Physician portion of form was completed and placed at front desk for patient pick up.  Time spent during visit with patient:  12 minutes

## 2018-12-06 DIAGNOSIS — J449 Chronic obstructive pulmonary disease, unspecified: Secondary | ICD-10-CM | POA: Diagnosis not present

## 2018-12-06 DIAGNOSIS — Z03818 Encounter for observation for suspected exposure to other biological agents ruled out: Secondary | ICD-10-CM | POA: Diagnosis not present

## 2018-12-06 DIAGNOSIS — J441 Chronic obstructive pulmonary disease with (acute) exacerbation: Secondary | ICD-10-CM | POA: Diagnosis not present

## 2018-12-06 DIAGNOSIS — J9601 Acute respiratory failure with hypoxia: Secondary | ICD-10-CM | POA: Diagnosis not present

## 2018-12-11 DIAGNOSIS — J449 Chronic obstructive pulmonary disease, unspecified: Secondary | ICD-10-CM | POA: Diagnosis not present

## 2018-12-11 DIAGNOSIS — J9601 Acute respiratory failure with hypoxia: Secondary | ICD-10-CM | POA: Diagnosis not present

## 2018-12-11 DIAGNOSIS — J441 Chronic obstructive pulmonary disease with (acute) exacerbation: Secondary | ICD-10-CM | POA: Diagnosis not present

## 2018-12-14 DIAGNOSIS — R32 Unspecified urinary incontinence: Secondary | ICD-10-CM | POA: Diagnosis not present

## 2018-12-20 ENCOUNTER — Other Ambulatory Visit: Payer: Self-pay | Admitting: Family

## 2018-12-20 ENCOUNTER — Other Ambulatory Visit: Payer: Self-pay | Admitting: Family Medicine

## 2018-12-20 DIAGNOSIS — B20 Human immunodeficiency virus [HIV] disease: Secondary | ICD-10-CM

## 2018-12-20 DIAGNOSIS — M545 Low back pain, unspecified: Secondary | ICD-10-CM

## 2018-12-21 MED FILL — AMLODIPINE BESYLATE 10 MG T: 10 | 30 days supply | Qty: 30 | Fill #2

## 2018-12-21 MED FILL — BIKTARVY 50-200-25 MG TABS: 50-200-25 | 30 days supply | Qty: 30 | Fill #0

## 2018-12-21 MED FILL — ALBUTEROL 0.083 MG/ML SOLN: (2.5 MG/3ML | 7 days supply | Qty: 90 | Fill #11

## 2018-12-21 MED FILL — PRAVASTATIN SODIUM 20 MG TA: 20 | 30 days supply | Qty: 30 | Fill #2

## 2018-12-21 MED FILL — DULoxetine HCL 30 MG CPEP: 30 | 30 days supply | Qty: 60 | Fill #0

## 2018-12-26 DIAGNOSIS — M25569 Pain in unspecified knee: Secondary | ICD-10-CM | POA: Diagnosis not present

## 2018-12-26 DIAGNOSIS — G894 Chronic pain syndrome: Secondary | ICD-10-CM | POA: Diagnosis not present

## 2018-12-26 DIAGNOSIS — M545 Low back pain: Secondary | ICD-10-CM | POA: Diagnosis not present

## 2018-12-26 DIAGNOSIS — Z79891 Long term (current) use of opiate analgesic: Secondary | ICD-10-CM | POA: Diagnosis not present

## 2019-01-05 DIAGNOSIS — J441 Chronic obstructive pulmonary disease with (acute) exacerbation: Secondary | ICD-10-CM | POA: Diagnosis not present

## 2019-01-05 DIAGNOSIS — J9601 Acute respiratory failure with hypoxia: Secondary | ICD-10-CM | POA: Diagnosis not present

## 2019-01-05 DIAGNOSIS — J449 Chronic obstructive pulmonary disease, unspecified: Secondary | ICD-10-CM | POA: Diagnosis not present

## 2019-01-10 ENCOUNTER — Encounter: Payer: Self-pay | Admitting: Family Medicine

## 2019-01-11 ENCOUNTER — Encounter: Payer: Self-pay | Admitting: Family Medicine

## 2019-01-14 ENCOUNTER — Encounter (HOSPITAL_COMMUNITY): Payer: Self-pay

## 2019-01-14 ENCOUNTER — Ambulatory Visit (INDEPENDENT_AMBULATORY_CARE_PROVIDER_SITE_OTHER)
Admission: EM | Admit: 2019-01-14 | Discharge: 2019-01-14 | Disposition: A | Discharge status: Home / Self Care | Payer: Medicare HMO | Source: Home / Self Care | Attending: Family Medicine | Admitting: Family Medicine

## 2019-01-14 ENCOUNTER — Emergency Department (HOSPITAL_COMMUNITY): Payer: Medicare HMO

## 2019-01-14 ENCOUNTER — Inpatient Hospital Stay (HOSPITAL_COMMUNITY)
Admission: EM | Admit: 2019-01-14 | Discharge: 2019-01-18 | DRG: 189 | Disposition: A | Discharge status: Home / Self Care | Payer: Medicare HMO | Attending: Family Medicine | Admitting: Family Medicine

## 2019-01-14 ENCOUNTER — Other Ambulatory Visit: Payer: Self-pay

## 2019-01-14 ENCOUNTER — Encounter (HOSPITAL_COMMUNITY): Payer: Self-pay | Admitting: Emergency Medicine

## 2019-01-14 DIAGNOSIS — I129 Hypertensive chronic kidney disease with stage 1 through stage 4 chronic kidney disease, or unspecified chronic kidney disease: Secondary | ICD-10-CM | POA: Diagnosis present

## 2019-01-14 DIAGNOSIS — G8929 Other chronic pain: Secondary | ICD-10-CM | POA: Diagnosis present

## 2019-01-14 DIAGNOSIS — Z720 Tobacco use: Secondary | ICD-10-CM | POA: Diagnosis not present

## 2019-01-14 DIAGNOSIS — Z21 Asymptomatic human immunodeficiency virus [HIV] infection status: Secondary | ICD-10-CM | POA: Diagnosis present

## 2019-01-14 DIAGNOSIS — Z880 Allergy status to penicillin: Secondary | ICD-10-CM | POA: Diagnosis not present

## 2019-01-14 DIAGNOSIS — Z9981 Dependence on supplemental oxygen: Secondary | ICD-10-CM | POA: Diagnosis not present

## 2019-01-14 DIAGNOSIS — Z7951 Long term (current) use of inhaled steroids: Secondary | ICD-10-CM

## 2019-01-14 DIAGNOSIS — R0602 Shortness of breath: Secondary | ICD-10-CM

## 2019-01-14 DIAGNOSIS — Z833 Family history of diabetes mellitus: Secondary | ICD-10-CM | POA: Diagnosis not present

## 2019-01-14 DIAGNOSIS — J9621 Acute and chronic respiratory failure with hypoxia: Secondary | ICD-10-CM | POA: Diagnosis present

## 2019-01-14 DIAGNOSIS — Z881 Allergy status to other antibiotic agents status: Secondary | ICD-10-CM

## 2019-01-14 DIAGNOSIS — R062 Wheezing: Secondary | ICD-10-CM | POA: Diagnosis not present

## 2019-01-14 DIAGNOSIS — J9622 Acute and chronic respiratory failure with hypercapnia: Secondary | ICD-10-CM | POA: Diagnosis present

## 2019-01-14 DIAGNOSIS — N183 Chronic kidney disease, stage 3 unspecified: Secondary | ICD-10-CM | POA: Diagnosis present

## 2019-01-14 DIAGNOSIS — F17210 Nicotine dependence, cigarettes, uncomplicated: Secondary | ICD-10-CM | POA: Diagnosis present

## 2019-01-14 DIAGNOSIS — E875 Hyperkalemia: Secondary | ICD-10-CM | POA: Diagnosis not present

## 2019-01-14 DIAGNOSIS — J441 Chronic obstructive pulmonary disease with (acute) exacerbation: Secondary | ICD-10-CM | POA: Diagnosis not present

## 2019-01-14 DIAGNOSIS — N189 Chronic kidney disease, unspecified: Secondary | ICD-10-CM | POA: Diagnosis not present

## 2019-01-14 DIAGNOSIS — J439 Emphysema, unspecified: Secondary | ICD-10-CM | POA: Diagnosis present

## 2019-01-14 DIAGNOSIS — Z20822 Contact with and (suspected) exposure to covid-19: Secondary | ICD-10-CM | POA: Diagnosis present

## 2019-01-14 DIAGNOSIS — G894 Chronic pain syndrome: Secondary | ICD-10-CM | POA: Diagnosis not present

## 2019-01-14 DIAGNOSIS — J449 Chronic obstructive pulmonary disease, unspecified: Secondary | ICD-10-CM | POA: Diagnosis not present

## 2019-01-14 DIAGNOSIS — E782 Mixed hyperlipidemia: Secondary | ICD-10-CM | POA: Diagnosis present

## 2019-01-14 DIAGNOSIS — R000 Tachycardia, unspecified: Secondary | ICD-10-CM | POA: Diagnosis not present

## 2019-01-14 DIAGNOSIS — R32 Unspecified urinary incontinence: Secondary | ICD-10-CM | POA: Diagnosis not present

## 2019-01-14 DIAGNOSIS — J80 Acute respiratory distress syndrome: Secondary | ICD-10-CM | POA: Diagnosis not present

## 2019-01-14 DIAGNOSIS — I10 Essential (primary) hypertension: Secondary | ICD-10-CM | POA: Diagnosis not present

## 2019-01-14 HISTORY — DX: Dyspnea, unspecified: R06.00

## 2019-01-14 LAB — BASIC METABOLIC PANEL
Anion gap: 10 (ref 5–15)
BUN: 20 mg/dL (ref 6–20)
CO2: 27 mmol/L (ref 22–32)
Calcium: 9.2 mg/dL (ref 8.9–10.3)
Chloride: 99 mmol/L (ref 98–111)
Creatinine, Ser: 1.28 mg/dL — ABNORMAL HIGH (ref 0.44–1.00)
GFR calc Af Amer: 54 mL/min — ABNORMAL LOW (ref >60)
GFR calc non Af Amer: 46 mL/min — ABNORMAL LOW (ref >60)
Glucose, Bld: 137 mg/dL — ABNORMAL HIGH (ref 70–99)
Potassium: 4.7 mmol/L (ref 3.5–5.1)
Sodium: 136 mmol/L (ref 135–145)

## 2019-01-14 LAB — CBC
HCT: 51.4 % — ABNORMAL HIGH (ref 36.0–46.0)
Hemoglobin: 16.6 g/dL — ABNORMAL HIGH (ref 12.0–15.0)
MCH: 31.4 pg (ref 26.0–34.0)
MCHC: 32.3 g/dL (ref 30.0–36.0)
MCV: 97.3 fL (ref 80.0–100.0)
Platelets: 336 10*3/uL (ref 150–400)
RBC: 5.28 MIL/uL — ABNORMAL HIGH (ref 3.87–5.11)
RDW: 14.6 % (ref 11.5–15.5)
WBC: 11.4 10*3/uL — ABNORMAL HIGH (ref 4.0–10.5)
nRBC: 0 % (ref 0.0–0.2)

## 2019-01-14 LAB — RESPIRATORY PANEL BY RT PCR (FLU A&B, COVID)
Influenza A by PCR: NEGATIVE
Influenza B by PCR: NEGATIVE
SARS Coronavirus 2 by RT PCR: NEGATIVE

## 2019-01-14 LAB — TROPONIN I (HIGH SENSITIVITY)
Troponin I (High Sensitivity): 17 ng/L (ref <18)
Troponin I (High Sensitivity): 20 ng/L — ABNORMAL HIGH (ref <18)

## 2019-01-14 LAB — POC SARS CORONAVIRUS 2 AG -  ED: SARS Coronavirus 2 Ag: NEGATIVE

## 2019-01-14 LAB — BRAIN NATRIURETIC PEPTIDE: B Natriuretic Peptide: 144.7 pg/mL — ABNORMAL HIGH (ref 0.0–100.0)

## 2019-01-14 MED ORDER — METHYLPREDNISOLONE SODIUM SUCC 125 MG IJ SOLR
45.0000 mg | Freq: Once | INTRAMUSCULAR | Status: DC
  Filled 2019-01-14: qty 2

## 2019-01-14 MED ORDER — ALBUTEROL SULFATE (2.5 MG/3ML) 0.083% IN NEBU
2.5000 mg | INHALATION_SOLUTION | RESPIRATORY_TRACT | Status: DC | PRN
  Administered 2019-01-16: 2.5 mg via RESPIRATORY_TRACT
  Filled 2019-01-14: qty 3

## 2019-01-14 MED ORDER — PREGABALIN 75 MG PO CAPS
75.0000 mg | ORAL_CAPSULE | Freq: Two times a day (BID) | ORAL | Status: DC
  Administered 2019-01-15 – 2019-01-18 (×8): 75 mg via ORAL
  Filled 2019-01-14: qty 3
  Filled 2019-01-14 (×4): qty 1
  Filled 2019-01-14: qty 3
  Filled 2019-01-14 (×2): qty 1

## 2019-01-14 MED ORDER — IPRATROPIUM-ALBUTEROL 0.5-2.5 (3) MG/3ML IN SOLN
3.0000 mL | RESPIRATORY_TRACT | Status: DC
  Administered 2019-01-15 – 2019-01-16 (×7): 3 mL via RESPIRATORY_TRACT
  Filled 2019-01-14 (×7): qty 3

## 2019-01-14 MED ORDER — METHYLPREDNISOLONE SODIUM SUCC 125 MG IJ SOLR
INTRAMUSCULAR | Status: AC
  Filled 2019-01-14: qty 2

## 2019-01-14 MED ORDER — NICOTINE 21 MG/24HR TD PT24: 21.0000 mg | MEDICATED_PATCH | Freq: Every day | TRANSDERMAL | Status: DC | PRN

## 2019-01-14 MED ORDER — ENOXAPARIN SODIUM 40 MG/0.4ML ~~LOC~~ SOLN
40.0000 mg | Freq: Every day | SUBCUTANEOUS | Status: DC
  Administered 2019-01-15 – 2019-01-18 (×4): 40 mg via SUBCUTANEOUS
  Filled 2019-01-14 (×4): qty 0.4

## 2019-01-14 MED ORDER — IPRATROPIUM BROMIDE HFA 17 MCG/ACT IN AERS
2.0000 | INHALATION_SPRAY | Freq: Once | RESPIRATORY_TRACT | Status: AC
  Administered 2019-01-14: 2 via RESPIRATORY_TRACT
  Filled 2019-01-14: qty 12.9

## 2019-01-14 MED ORDER — ALBUTEROL SULFATE HFA 108 (90 BASE) MCG/ACT IN AERS
2.0000 | INHALATION_SPRAY | Freq: Once | RESPIRATORY_TRACT | Status: AC
  Administered 2019-01-14: 2 via RESPIRATORY_TRACT
  Filled 2019-01-14: qty 6.7

## 2019-01-14 MED ORDER — BICTEGRAVIR-EMTRICITAB-TENOFOV 50-200-25 MG PO TABS
1.0000 | ORAL_TABLET | Freq: Every day | ORAL | Status: DC
  Administered 2019-01-15 – 2019-01-18 (×4): 1 via ORAL
  Filled 2019-01-14 (×4): qty 1

## 2019-01-14 MED ORDER — PREDNISONE 20 MG PO TABS
40.0000 mg | ORAL_TABLET | Freq: Every day | ORAL | Status: DC
  Administered 2019-01-15 – 2019-01-18 (×4): 40 mg via ORAL
  Filled 2019-01-14 (×4): qty 2

## 2019-01-14 MED ORDER — ALBUTEROL (5 MG/ML) CONTINUOUS INHALATION SOLN
10.0000 mg/h | INHALATION_SOLUTION | Freq: Once | RESPIRATORY_TRACT | Status: AC
  Administered 2019-01-15: 10 mg/h via RESPIRATORY_TRACT
  Filled 2019-01-14: qty 20

## 2019-01-14 MED ORDER — METHYLPREDNISOLONE SODIUM SUCC 125 MG IJ SOLR
80.0000 mg | Freq: Once | INTRAMUSCULAR | Status: AC
  Administered 2019-01-14: 80 mg via INTRAMUSCULAR

## 2019-01-14 MED ORDER — PRAVASTATIN SODIUM 10 MG PO TABS
20.0000 mg | ORAL_TABLET | Freq: Every day | ORAL | Status: DC
  Administered 2019-01-15 – 2019-01-18 (×4): 20 mg via ORAL
  Filled 2019-01-14 (×4): qty 2

## 2019-01-14 MED ORDER — AMLODIPINE BESYLATE 10 MG PO TABS
10.0000 mg | ORAL_TABLET | Freq: Every day | ORAL | Status: DC
  Administered 2019-01-15 – 2019-01-18 (×4): 10 mg via ORAL
  Filled 2019-01-14: qty 2
  Filled 2019-01-14 (×3): qty 1

## 2019-01-14 MED ORDER — DULOXETINE HCL 60 MG PO CPEP
60.0000 mg | ORAL_CAPSULE | Freq: Every day | ORAL | Status: DC
  Administered 2019-01-15 – 2019-01-18 (×4): 60 mg via ORAL
  Filled 2019-01-14 (×4): qty 1

## 2019-01-14 MED ORDER — IPRATROPIUM-ALBUTEROL 0.5-2.5 (3) MG/3ML IN SOLN
3.0000 mL | Freq: Once | RESPIRATORY_TRACT | Status: AC
  Administered 2019-01-14: 3 mL via RESPIRATORY_TRACT
  Filled 2019-01-14: qty 3

## 2019-01-14 NOTE — H&P (Addendum)
Molly Dillon Service Pager: [phone]  Patient name: Molly Dillon Medical record number: [id number] Date of birth: [birth year] Age: 59 y.o. Gender: female  Primary Care Provider: Gerlene Fee, DO Consultants: None Code Status: Full Preferred Emergency Contact: Edwin Cap, patient's daughter, patient does not have her number currently  Chief Complaint: Shortness of breath  Assessment and Plan: [name] is a 59 y.o. female presenting with shortness of breath and increased O2 requirement. PMH is significant for  HIV, hyperlipidemia, hypertension, tobacco abuse, COPD with asthma, MDD, CKD stage III, chronic pain.  Acute on chronic hypoxic hypercarbic respiratory failure Differential diagnosis includes CAP, COPD exacerbation, PE. Infection remains high on differential as patient has increased white count in the setting of HIV. Wells score is 1.5 for mild tachycardia, lower suspicion for active PE as COPD exacerbation and an infectious process in the setting of HIV are more likely. COVID-19 POC and PCR tests negative, Influenza A and B negative. Patient has an O2 requirement of 2L intermittently at home, but has required 7L via [place] to maintain sats >88% in the ED. Patient feels improved on duonebs, solumedrol and O2 therapy.  Exam reveals crackles and significant wheezing in all lung fields. CXR shows emphysema with "basilar crowding," unchanged from piror CXRs.  Pneumonia in HIV patients: As pneumonia has high rate of mortality in HIV + patients even with those with >500 CD4 count, sputum culture, gram stain, urinary antigens for s. pneumoniae and legionella should be collected. This patient has additional risk factors for pna as she has underlying lung disease, COPD/Asthma, and current smoker. Of note, patient has history of PCP pna in 2002.  If pneumonia, would likely be community acquired. In this setting, with decreased risk for  pseudomonas, recommended antibiotic regimen is cefepime OR ceftazidime OR zosyn OR levofloxacin. Patient has anaphylaxis to PCN with no documented history of tolerating cephalosporins.  Will watch closely as patient has required intubation in the past.  Admit to FPTS, progressive unit, attending Dr. Andria Frames  Continuous pulse oximetry  Oxygen therapy with goal of SpO2 88-94%  Order CD4 count for transient depression in setting of infection   F/U sputum gram stain with reflex culture  Solumedrol x 1 at UC. Start Prednisone '40mg'$  in AM x 5 days. Duonebs q4 hours with albuterol neb q2 hours PRN.  Dulera as Advair is not on formulary.  Start levofloxacin  Expectorant gram stain  Consider CTA chest to rule out PE if patient does not continue to improve.   Can also consider CT chest to better visualize lungs.   Can consider ID consult for antibiotic coverage if worsening symptoms  Daily AM CBC  Monitor vitals per progressive, patient is tachypneic, tachycardic.   Recommend flu vaccine if not already received.   COPD with asthma Albuterol and Advair Diskus daily, current smoker. GOLD stage D.  Advair not on formulary, will sub Dulera while in patient.   Exacerbation meds as above.    HIV, stable Diagnosed in 1993. Last CD4 count was in 08/2018 at 393, last viral load at same time was <20 not detectable.  Continue home Biktarvy  CD4 count   Hypertension, chronic Blood pressures in 150s-160s/70s-80s on average. MAPS in 100s.  Continue home amlodipine 10 mg  Continue to monitor   Hyperlipidemi/a, chronic Last lipid profile 08/2018 Tchol 204, HDL 46, LDL 124, TG 22. ASCVD risk is 11.6%.  Continue home pravastatin 20 mg.   Tobacco abuse Patient continues to smoke  cigarettes though has decreased the amount per day, she has not smoked since before Christmas due to not feeling well.  Nicotine patch ordered  Patient would benefit from further counseling as smoking in the  setting of HIV doubles risk of pulmonary infections   CKD, stable eGFR 46 today. 34 (10/01/17), 44 (2018). Steady decline since 2015. Cr 1.28, baseline average 1.5 since 2015.   Avoid nephrotoxic agents   AM BMP  Chronic pain, chronic At home, Cymbalta 60 mg, Lyrica 75 mg, Voltaren gel. No complaints today.   Continue cymbalta and lyrica   FEN/GI: regular diet Prophylaxis: Lovenox  Disposition: to progressive pending stabilized oxygen requirement and further medical work up  History of Present Illness:  Molly Dillon is a 59 y.o. female presenting with shortness of breath that started on December 25.  She denies fever but says she has had increased cough and sputum that is green-yellow in color, and the quantity has increased from her usual sputum production.  She has been using her albuterol recently and it has been helpful.  She has also been using her maintenance inhalers and takes them regularly.  She decided to go to urgent care today because she felt more tired and her breathing and heart rate was faster than normal.  She has a smart watch that tracks her heart rate, and it has been around 108 recently.  She denies nausea, vomiting, and diarrhea.  She has not had worsening muscle aches or joint pain.  She takes Airline pilot daily for HIV.  She has not smoked since her symptoms began on December 25.  Review Of Systems: Per HPI with the following additions:   Review of Systems  Constitutional: Positive for malaise/fatigue. Negative for chills and fever.  HENT: Positive for congestion. Negative for sore throat.   Respiratory: Positive for cough, sputum production, shortness of breath and wheezing. Negative for hemoptysis.   Cardiovascular: Negative for chest pain.  Gastrointestinal: Negative for abdominal pain, diarrhea, nausea and vomiting.  Genitourinary: Negative for dysuria.  Musculoskeletal: Negative for myalgias.  Psychiatric/Behavioral: Negative for depression.    Patient  Active Problem List   Diagnosis Date Noted  . Screening for cervical cancer 09/29/2018  . Health care maintenance 02/28/2018  . AKI (acute kidney injury) ([place])   . CKD (chronic kidney disease), stage III 02/22/2017  . Chronic pain disorder 03/22/2016  . Depression, major, recurrent, moderate ([place]) 04/02/2015  . COPD with asthma ([place]) 06/06/2014  . Tobacco use disorder 06/15/2013  . Allergic rhinitis 05/09/2009  . Mixed hyperlipidemia 05/03/2008  . Human immunodeficiency virus (HIV) disease ([place]) 01/26/2006  . Primary hypertension 01/26/2006    Past Medical History: Past Medical History:  Diagnosis Date  . Acute renal insufficiency 06/06/2014  . Asthma   . Constipation 07/19/2014  . COPD (chronic obstructive pulmonary disease) with acute bronchitis ([place]) 06/06/2014  . Depression   . Depression, major, recurrent, moderate ([place]) 04/02/2015  . HIV infection ([place])   . Hyperlipidemia   . Hypertension   . Hypertensive nephropathy 07/19/2014  . Unintentional weight loss 02/13/2015    Past Surgical History: History reviewed. No pertinent surgical history.  Social History: Social History   Tobacco Use  . Smoking status: Current Every Day Smoker    Packs/day: 0.50    Years: 36.00    Pack years: 18.00    Types: Cigarettes    Start date: 01/13/1980  . Smokeless tobacco: Never Used  . Tobacco comment: down to 5 cigs a day  Substance Use  Topics  . Alcohol use: No    Alcohol/week: 0.0 standard drinks  . Drug use: No   Additional social history:  Please also refer to relevant sections of EMR.  Family History: Family History  Problem Relation Age of Onset  . Diabetes Mother   . Healthy Father     Allergies and Medications: Allergies  Allergen Reactions  . Dapsone Shortness Of Breath  . Penicillins Anaphylaxis    Has patient had a PCN reaction causing immediate rash, facial/tongue/throat swelling, SOB or lightheadedness with hypotension: Yes Has patient had a PCN reaction  causing severe rash involving mucus membranes or skin necrosis: Yes Has patient had a PCN reaction that required hospitalization: No Has patient had a PCN reaction occurring within the last 10 years: No If all of the above answers are "NO", then may proceed with Cephalosporin use.    No current facility-administered medications on file prior to encounter.   Current Outpatient Medications on File Prior to Encounter  Medication Sig Dispense Refill  . albuterol (PROVENTIL) (2.5 MG/3ML) 0.083% nebulizer solution Take 3 mLs (2.5 mg total) by nebulization every 6 (six) hours as needed for wheezing or shortness of breath. 75 mL 12  . albuterol (VENTOLIN HFA) 108 (90 Base) MCG/ACT inhaler Inhale 2 puffs into the lungs every 6 (six) hours as needed for wheezing. 18 g 11  . amLODipine (NORVASC) 10 MG tablet TAKE 1 TABLET BY MOUTH ONCE A DAY 30 tablet 6  . BIKTARVY 50-200-25 MG TABS tablet TAKE 1 TABLET BY MOUTH DAILY. 30 tablet 6  . CHANTIX CONTINUING MONTH PAK 1 MG tablet TAKE 1 TABLET (1 MG TOTAL) BY MOUTH 2 TIMES DAILY. 56 tablet 0  . diclofenac sodium (VOLTAREN) 1 % GEL Apply 2 g topically 4 (four) times daily as needed (pain).   2  . DULoxetine (CYMBALTA) 30 MG capsule TAKE 2 CAPSULES (60 MG TOTAL) BY MOUTH DAILY. 60 capsule 2  . fluticasone furoate-vilanterol (BREO ELLIPTA) 100-25 MCG/INH AEPB Inhale 1 puff into the lungs daily. 28 each 0  . gentamicin cream (GARAMYCIN) 0.1 % Apply 1 application topically 3 (three) times daily. 30 g 1  . nicotine (NICODERM CQ - DOSED IN MG/24 HOURS) 21 mg/24hr patch Place 1 patch (21 mg total) onto the skin daily. (Patient taking differently: Place 21 mg onto the skin daily as needed (nicotine dependence). ) 28 patch 0  . pravastatin (PRAVACHOL) 20 MG tablet TAKE 1 TABLET (20 MG TOTAL) BY MOUTH DAILY 30 tablet 6  . pregabalin (LYRICA) 75 MG capsule TAKE 1 CAPSULE (75 MG TOTAL) BY MOUTH 2 (TWO) TIMES DAILY. 60 capsule 2  . SPIRIVA HANDIHALER 18 MCG inhalation  capsule PLACE 1 CAPSULE INTO THE INHALER AND INHALE DAILY 30 capsule 2  . triamcinolone ointment (KENALOG) 0.5 % Apply 1 application topically 2 (two) times daily. 30 g 3    Objective: BP (!) 149/92   Pulse 91   Temp (!) 97.5 F (36.4 C) (Oral)   Resp (!) 31   LMP 01/12/2011   SpO2 100%  Exam: General: Pleasant woman resting in bed, NAD, WNWD, able to speak in short sentences Eyes: Anicteric sclerae ENTM: Moist mucous membranes, clear oropharynx, no white exudate on tongue Neck: Supple, no LAD Cardiovascular: RRR, no M/R/G Respiratory: Crackles and biphasic wheezing appreciated in all lung fields, mildly increased work of breathing, able to speak in short sentences only, prolonged expiration Gastrointestinal: Soft, NT, ND MSK: Trace pedal edema Derm: Dry skin no rashes or lesions Neuro:  No focal deficits Psych: Good mood, normal affect  Labs and Imaging: CBC BMET  Recent Labs  Lab 01/14/19 1841  WBC 11.4*  HGB 16.6*  HCT 51.4*  PLT 336   Recent Labs  Lab 01/14/19 1841  NA 136  K 4.7  CL 99  CO2 27  BUN 20  CREATININE 1.28*  GLUCOSE 137*  CALCIUM 9.2     EKG: EKG Interpretation  Date/Time:  Saturday January 14 2019 18:30:25 EST Ventricular Rate:  97 PR Interval:  116 QRS Duration: 100 QT Interval:  372 QTC Calculation: 472 R Axis:   46 Text Interpretation: Normal sinus rhythm Cannot rule out Anterior infarct , age undetermined Abnormal ECG t waves more peaked compared to previous. Confirmed by Charlesetta Shanks [phone]) on 01/14/2019 10:53:08 PM  DG Chest Portable 1 View  Result Date: 01/14/2019 CLINICAL DATA:  Shortness of breath. EXAM: PORTABLE CHEST 1 VIEW COMPARISON:  Radiograph 09/28/2017. CT 05/04/2017 FINDINGS: Emphysema with subsequent basilar crowding, unchanged from prior exam. No acute airspace disease. Normal heart size and mediastinal contours. No pleural fluid or pneumothorax. No acute osseous abnormalities. IMPRESSION: Emphysema with subsequent  basilar crowding, unchanged from prior exam. No superimposed acute abnormality. Electronically Signed   By: Keith Rake M.D.   On: 01/14/2019 22:48     Gladys Damme, MD 01/14/2019, 11:12 PM PGY-1, [place] Intern pager: [phone], text pages welcome  FPTS Upper-Level Resident Addendum   I have independently interviewed and examined the patient. I have discussed the above with the original author and agree with their documentation. My edits for correction/addition/clarification are in blue. Please see also any attending notes.    Kathrene Alu, MD PGY-3, [facility] 01/15/2019 2:35 AM  FPTS Service pager: [phone] (text pages welcome through [facility])

## 2019-01-14 NOTE — ED Notes (Signed)
EMS still not arrived... called back and upgraded to emergency traffic. Will continue to monitor. Patient continues to have labored breathing. A&O x4.

## 2019-01-14 NOTE — ED Notes (Signed)
EMS arrived, transporting to ED at this time.

## 2019-01-14 NOTE — ED Triage Notes (Signed)
Pt arrives via EMS from [facility] with reports of resp distress since 12/25. EMS gave 2 mag, pt on 4L. Audible wheezing noted. 80 solud-medrol given at [facility].

## 2019-01-14 NOTE — ED Provider Notes (Signed)
[name] Provider Note   CSN: [id number] Arrival date & time: 01/14/19  1822    History Chief Complaint  Patient presents with  . Shortness of Breath    Molly Dillon is a 59 y.o. female with past medical history significant for HIV infection, COPD, hypertension, hyperlipidemia who presents for evaluation of shortness of breath.  Patient states she has had shortness of breath which began around Christmas.  She has been using her home nebulizers without relief.  States she has had a cough as well as felt extremely "wheezy."  She denies any chest pain, lightheadedness, dizziness, fever, chills, neck pain, neck stiffness, hemoptysis, unilateral leg swelling, redness, warmth, abdominal pain, diarrhea, dysuria.  She is tolerating p.o. intake at home.  Denies additional aggravating or alleviating factors.  Uses intermittent oxygen at home, 2 L via nasal cannula.  History obtained from patient and past medical records.  No interpreter is used.   Last CD4- 08/29/18 at 81  HPI     Past Medical History:  Diagnosis Date  . Acute renal insufficiency 06/06/2014  . Asthma   . Constipation 07/19/2014  . COPD (chronic obstructive pulmonary disease) with acute bronchitis ([place]) 06/06/2014  . Depression   . Depression, major, recurrent, moderate ([place]) 04/02/2015  . HIV infection ([place])   . Hyperlipidemia   . Hypertension   . Hypertensive nephropathy 07/19/2014  . Unintentional weight loss 02/13/2015    Patient Active Problem List   Diagnosis Date Noted  . Screening for cervical cancer 09/29/2018  . Health care maintenance 02/28/2018  . AKI (acute kidney injury) ([place])   . CKD (chronic kidney disease), stage III 02/22/2017  . Chronic pain disorder 03/22/2016  . Depression, major, recurrent, moderate ([place]) 04/02/2015  . COPD with asthma ([place]) 06/06/2014  . Tobacco use disorder 06/15/2013  . Allergic rhinitis 05/09/2009  . Mixed hyperlipidemia 05/03/2008    . Human immunodeficiency virus (HIV) disease ([place]) 01/26/2006  . Primary hypertension 01/26/2006    History reviewed. No pertinent surgical history.   OB History   No obstetric history on file.     Family History  Problem Relation Age of Onset  . Diabetes Mother   . Healthy Father     Social History   Tobacco Use  . Smoking status: Current Every Day Smoker    Packs/day: 0.50    Years: 36.00    Pack years: 18.00    Types: Cigarettes    Start date: 01/13/1980  . Smokeless tobacco: Never Used  . Tobacco comment: down to 5 cigs a day  Substance Use Topics  . Alcohol use: No    Alcohol/week: 0.0 standard drinks  . Drug use: No    Home Medications Prior to Admission medications   Medication Sig Start Date End Date Taking? Authorizing Provider  albuterol (PROVENTIL) (2.5 MG/3ML) 0.083% nebulizer solution Take 3 mLs (2.5 mg total) by nebulization every 6 (six) hours as needed for wheezing or shortness of breath. 09/20/18   Truman Hayward, MD  albuterol (VENTOLIN HFA) 108 (90 Base) MCG/ACT inhaler Inhale 2 puffs into the lungs every 6 (six) hours as needed for wheezing. 09/20/18   Truman Hayward, MD  amLODipine (NORVASC) 10 MG tablet TAKE 1 TABLET BY MOUTH ONCE A DAY 10/26/18   Tommy Medal, Lavell Islam, MD  BIKTARVY 50-200-25 MG TABS tablet TAKE 1 TABLET BY MOUTH DAILY. 12/20/18   Truman Hayward, MD  CHANTIX CONTINUING MONTH PAK 1 MG tablet  TAKE 1 TABLET (1 MG TOTAL) BY MOUTH 2 TIMES DAILY. 11/17/17   Golden Circle, FNP  diclofenac sodium (VOLTAREN) 1 % GEL Apply 2 g topically 4 (four) times daily as needed (pain).  07/30/17   [provider]  DULoxetine (CYMBALTA) 30 MG capsule TAKE 2 CAPSULES (60 MG TOTAL) BY MOUTH DAILY. 12/20/18   Autry-Lott, Naaman Plummer, DO  fluticasone furoate-vilanterol (BREO ELLIPTA) 100-25 MCG/INH AEPB Inhale 1 puff into the lungs daily. 05/12/18   Caroline More, DO  gentamicin cream (GARAMYCIN) 0.1 % Apply 1 application topically 3 (three)  times daily. 08/18/17   Edrick Kins, DPM  nicotine (NICODERM CQ - DOSED IN MG/24 HOURS) 21 mg/24hr patch Place 1 patch (21 mg total) onto the skin daily. Patient taking differently: Place 21 mg onto the skin daily as needed (nicotine dependence).  05/10/17   [place] Bing, DO  pravastatin (PRAVACHOL) 20 MG tablet TAKE 1 TABLET (20 MG TOTAL) BY MOUTH DAILY 10/26/18   Tommy Medal, Lavell Islam, MD  pregabalin (LYRICA) 75 MG capsule TAKE 1 CAPSULE (75 MG TOTAL) BY MOUTH 2 (TWO) TIMES DAILY. 07/01/18   [place] Bing, DO  SPIRIVA HANDIHALER 18 MCG inhalation capsule PLACE 1 CAPSULE INTO THE INHALER AND INHALE DAILY 12/27/17   [place] Bing, DO  triamcinolone ointment (KENALOG) 0.5 % Apply 1 application topically 2 (two) times daily. 05/28/16   [place] Bing, DO    Allergies    Dapsone and Penicillins  Review of Systems   Review of Systems  Constitutional: Negative.   HENT: Negative.   Respiratory: Positive for cough, shortness of breath and wheezing. Negative for apnea, choking, chest tightness and stridor.   Cardiovascular: Negative.   Gastrointestinal: Negative.   Genitourinary: Negative.   Musculoskeletal: Negative.   Skin: Negative.   Neurological: Negative.   All other systems reviewed and are negative.   Physical Exam Updated Vital Signs BP (!) 149/92   Pulse 91   Temp (!) 97.5 F (36.4 C) (Oral)   Resp (!) 31   LMP 01/12/2011   SpO2 100%   Physical Exam Vitals and nursing note reviewed.  Constitutional:      General: She is not in acute distress.    Appearance: She is well-developed. She is not ill-appearing or toxic-appearing.  HENT:     Head: Normocephalic and atraumatic.     Mouth/Throat:     Mouth: Mucous membranes are moist.  Eyes:     Pupils: Pupils are equal, round, and reactive to light.  Cardiovascular:     Rate and Rhythm: Normal rate.     Pulses: Normal pulses.     Heart sounds: Normal heart sounds.  Pulmonary:     Effort: Tachypnea and  accessory muscle usage present. No respiratory distress.     Breath sounds: Decreased air movement and transmitted upper airway sounds present. Wheezing present.     Comments: Diffuse expiratory wheeze throughout.  Intermittent upper airway sounds Abdominal:     General: There is no distension.     Tenderness: There is no abdominal tenderness. There is no guarding or rebound.     Comments: Soft, nontender  Musculoskeletal:        General: Normal range of motion.     Cervical back: Normal range of motion.     Comments: Moves all 4 extremities without difficulty.  Bevelyn Buckles' sign negative  Skin:    General: Skin is warm and dry.     Capillary Refill: Capillary refill takes less  than 2 seconds.     Comments: No edema, erythema or warmth.  Brisk capillary refill.  Neurological:     Mental Status: She is alert.    ED Results / Procedures / Treatments   Labs (all labs ordered are listed, but only abnormal results are displayed) Labs Reviewed  CBC - Abnormal; Notable for the following components:      Result Value   WBC 11.4 (*)    RBC 5.28 (*)    Hemoglobin 16.6 (*)    HCT 51.4 (*)    All other components within normal limits  BASIC METABOLIC PANEL - Abnormal; Notable for the following components:   Glucose, Bld 137 (*)    Creatinine, Ser 1.28 (*)    GFR calc non Af Amer 46 (*)    GFR calc Af Amer 54 (*)    All other components within normal limits  BRAIN NATRIURETIC PEPTIDE - Abnormal; Notable for the following components:   B Natriuretic Peptide 144.7 (*)    All other components within normal limits  TROPONIN I (HIGH SENSITIVITY) - Abnormal; Notable for the following components:   Troponin I (High Sensitivity) 20 (*)    All other components within normal limits  RESPIRATORY PANEL BY RT PCR (FLU A&B, COVID)  BLOOD GAS, ARTERIAL  POC SARS CORONAVIRUS 2 AG -  ED  TROPONIN I (HIGH SENSITIVITY)    EKG EKG Interpretation  Date/Time:  Saturday January 14 2019 18:30:25  EST Ventricular Rate:  97 PR Interval:  116 QRS Duration: 100 QT Interval:  372 QTC Calculation: 472 R Axis:   46 Text Interpretation: Normal sinus rhythm Cannot rule out Anterior infarct , age undetermined Abnormal ECG t waves more peaked compared to previous. Confirmed by Charlesetta Shanks [phone]) on 01/14/2019 10:53:08 PM   Radiology DG Chest Portable 1 View  Result Date: 01/14/2019 CLINICAL DATA:  Shortness of breath. EXAM: PORTABLE CHEST 1 VIEW COMPARISON:  Radiograph 09/28/2017. CT 05/04/2017 FINDINGS: Emphysema with subsequent basilar crowding, unchanged from prior exam. No acute airspace disease. Normal heart size and mediastinal contours. No pleural fluid or pneumothorax. No acute osseous abnormalities. IMPRESSION: Emphysema with subsequent basilar crowding, unchanged from prior exam. No superimposed acute abnormality. Electronically Signed   By: Keith Rake M.D.   On: 01/14/2019 22:48    Procedures .Critical Care Performed by: Nettie Elm, PA-C Authorized by: Nettie Elm, PA-C   Critical care provider statement:    Critical care time (minutes):  35   Critical care was necessary to treat or prevent imminent or life-threatening deterioration of the following conditions:  Respiratory failure   Critical care was time spent personally by me on the following activities:  Discussions with consultants, evaluation of patient's response to treatment, examination of patient, ordering and performing treatments and interventions, ordering and review of laboratory studies, ordering and review of radiographic studies, pulse oximetry, re-evaluation of patient's condition, obtaining history from patient or surrogate and review of old charts   (including critical care time)  Medications Ordered in ED Medications  methylPREDNISolone sodium succinate (SOLU-MEDROL) 125 mg/2 mL injection 45 mg (45 mg Intravenous Not Given 01/14/19 2228)  albuterol (PROVENTIL,VENTOLIN) solution  continuous neb (has no administration in time range)  albuterol (VENTOLIN HFA) 108 (90 Base) MCG/ACT inhaler 2 puff (2 puffs Inhalation Given 01/14/19 2052)  ipratropium (ATROVENT HFA) inhaler 2 puff (2 puffs Inhalation Given 01/14/19 2052)  ipratropium-albuterol (DUONEB) 0.5-2.5 (3) MG/3ML nebulizer solution 3 mL (3 mLs Nebulization Given 01/14/19 2147)  ED Course  I have reviewed the triage vital signs and the nursing notes.  Pertinent labs & imaging results that were available during my care of the patient were reviewed by me and considered in my medical decision making (see chart for details).  59 year old female presents for evaluation of shortness of breath and wheeze.  Seen at urgent care just PTA.  Afebrile, nonseptic appearing. Given 80 mg of Solu-Medrol, 2 mg of mag and albuterol.  Patient requiring 4 L of oxygen via nasal cannula, she is intermittently on home O2 of 2 L [place].  Denies Chest pain.  Patient states she has had some upper respiratory symptoms since Christmas. Her rapid Covid test here in the ED is negative.  Will give the additional 45 mg Solu-Medrol as urgent care gave 80 mg to total 125 mg Solumedrol, she is ready received mag 2 gm, albuterol.  Patient with diffuse expiratory wheeze, tachypnea.  She is on 6 L nasal cannula.  She is able to speak full sentences however is tachypnic.  No evidence of DVT on exam.  Homans' sign negative.  Heart clear.  Abdomen soft.  Labs obtained from triage.   Clinical Course as of Jan 13 2313  Sat Jan 14, 2019  2256 17->20. Denies CP  Troponin I (High Sensitivity)(!) [BH]  2256 Creatinine 1.28, hyperglycemia at 427  Basic metabolic panel(!) [BH]  0623 Mildly elevated however appears at baseline from previous.  Chest x-ray does not show volume overload.  Brain natriuretic peptide(!) [BH]  2256 Mild leukocytosis at 11.4.  CBC(!) [BH]  2257 Rapid Covid negative will order full PCR  POC SARS Coronavirus 2 Ag-ED - Nasal Swab (BD Veritor Kit)  [BH]  2257 No STEMI, she does have some mildly peaked T waves however no hyperkalemia on labs.   [BH]  2257 Emphysema, no cardiomegaly, pulmonary edema, infiltrates.  DG Chest Portable 1 View [BH]    Clinical Course User Index [BH] Unity Luepke A, PA-C   Patient reassessed after DuoNeb.  She continues to have diffuse wheeze however with some improvement from previous exam.  We will plan a continuous nebulizer.  Patient will need admission for COPD exacerbation.  She does have some mild elevation in her BNP however she does not appear fluid overloaded and chest x-ray does not show any evidence of pulmonary edema.  No cardiomegaly. Question viral upper respiratory infection as enticing event.  She does have history of HIV however no infiltrates to suggest bacterial infectious process.  Last CD4 count was 393 per prior chart review.  Given she has hypoxic she has no evidence of DVT on exam, denies chest pain.  I have low suspicion for PE, ACS as cause of her shortness of breath given she has significant wheeze and story most consistent with COPD exacerbation.   2310: CONSULT with Dr. Shan Levans with [facility] who will evaluate patient for admission.  The patient appears reasonably stabilized for admission considering the current resources, flow, and capabilities available in the ED at this time, and I doubt any other [facility] requiring further screening and/or treatment in the ED prior to admission.  Discussed with attending physician, Dr. Johnney Killian who agrees with above treatment, plan and disposition. MDM Rules/Calculators/A&P                      Akesha Uresti was evaluated in Emergency Department on 01/14/2019 for the symptoms described in the history of present illness. She was evaluated in the  context of the global COVID-19 pandemic, which necessitated consideration that the patient might be at risk for infection with the SARS-CoV-2 virus that causes COVID-19. Institutional protocols and  algorithms that pertain to the evaluation of patients at risk for COVID-19 are in a state of rapid change based on information released by regulatory bodies including the CDC and federal and state organizations. These policies and algorithms were followed during the patient's care in the ED. Final Clinical Impression(s) / ED Diagnoses Final diagnoses:  COPD exacerbation [facility])    Rx / DC Orders ED Discharge Orders    None       Marcia Hartwell A, PA-C 01/14/19 2315    Charlesetta Shanks, MD 01/23/19 1058

## 2019-01-14 NOTE — ED Triage Notes (Signed)
Pt here w/ SOB since 5 days ago, breathing labored and tachypnic upon arrival. Pt satting 35% w/ good pleth during triage. MD bedside for evaluation, recommending transport to ED via EMS. EMS called, will continue to monitor patient until their arrival. Patient increased to 90% on 6L of O2 via [place]. BP 150/75, RR 32, HR100. Crackles noted in lungs bilaterally.

## 2019-01-15 ENCOUNTER — Encounter (HOSPITAL_COMMUNITY): Payer: Self-pay | Admitting: Family Medicine

## 2019-01-15 DIAGNOSIS — Z9981 Dependence on supplemental oxygen: Secondary | ICD-10-CM | POA: Diagnosis not present

## 2019-01-15 DIAGNOSIS — F17210 Nicotine dependence, cigarettes, uncomplicated: Secondary | ICD-10-CM | POA: Diagnosis present

## 2019-01-15 DIAGNOSIS — J9622 Acute and chronic respiratory failure with hypercapnia: Secondary | ICD-10-CM | POA: Diagnosis present

## 2019-01-15 DIAGNOSIS — J449 Chronic obstructive pulmonary disease, unspecified: Secondary | ICD-10-CM | POA: Diagnosis not present

## 2019-01-15 DIAGNOSIS — Z20822 Contact with and (suspected) exposure to covid-19: Secondary | ICD-10-CM | POA: Diagnosis present

## 2019-01-15 DIAGNOSIS — I10 Essential (primary) hypertension: Secondary | ICD-10-CM | POA: Diagnosis not present

## 2019-01-15 DIAGNOSIS — Z7951 Long term (current) use of inhaled steroids: Secondary | ICD-10-CM | POA: Diagnosis not present

## 2019-01-15 DIAGNOSIS — Z881 Allergy status to other antibiotic agents status: Secondary | ICD-10-CM | POA: Diagnosis not present

## 2019-01-15 DIAGNOSIS — G8929 Other chronic pain: Secondary | ICD-10-CM | POA: Diagnosis present

## 2019-01-15 DIAGNOSIS — J441 Chronic obstructive pulmonary disease with (acute) exacerbation: Secondary | ICD-10-CM | POA: Diagnosis not present

## 2019-01-15 DIAGNOSIS — Z880 Allergy status to penicillin: Secondary | ICD-10-CM | POA: Diagnosis not present

## 2019-01-15 DIAGNOSIS — N183 Chronic kidney disease, stage 3 unspecified: Secondary | ICD-10-CM | POA: Diagnosis present

## 2019-01-15 DIAGNOSIS — J9621 Acute and chronic respiratory failure with hypoxia: Secondary | ICD-10-CM | POA: Diagnosis present

## 2019-01-15 DIAGNOSIS — E782 Mixed hyperlipidemia: Secondary | ICD-10-CM | POA: Diagnosis present

## 2019-01-15 DIAGNOSIS — R0602 Shortness of breath: Secondary | ICD-10-CM | POA: Diagnosis present

## 2019-01-15 DIAGNOSIS — I129 Hypertensive chronic kidney disease with stage 1 through stage 4 chronic kidney disease, or unspecified chronic kidney disease: Secondary | ICD-10-CM | POA: Diagnosis present

## 2019-01-15 DIAGNOSIS — Z833 Family history of diabetes mellitus: Secondary | ICD-10-CM | POA: Diagnosis not present

## 2019-01-15 DIAGNOSIS — Z21 Asymptomatic human immunodeficiency virus [HIV] infection status: Secondary | ICD-10-CM | POA: Diagnosis present

## 2019-01-15 DIAGNOSIS — J439 Emphysema, unspecified: Secondary | ICD-10-CM | POA: Diagnosis present

## 2019-01-15 LAB — BASIC METABOLIC PANEL
Anion gap: 13 (ref 5–15)
BUN: 25 mg/dL — ABNORMAL HIGH (ref 6–20)
CO2: 30 mmol/L (ref 22–32)
Calcium: 9.4 mg/dL (ref 8.9–10.3)
Chloride: 94 mmol/L — ABNORMAL LOW (ref 98–111)
Creatinine, Ser: 1.26 mg/dL — ABNORMAL HIGH (ref 0.44–1.00)
GFR calc Af Amer: 55 mL/min — ABNORMAL LOW (ref >60)
GFR calc non Af Amer: 47 mL/min — ABNORMAL LOW (ref >60)
Glucose, Bld: 178 mg/dL — ABNORMAL HIGH (ref 70–99)
Potassium: 4.3 mmol/L (ref 3.5–5.1)
Sodium: 137 mmol/L (ref 135–145)

## 2019-01-15 LAB — CBC
HCT: 49.3 % — ABNORMAL HIGH (ref 36.0–46.0)
Hemoglobin: 15.9 g/dL — ABNORMAL HIGH (ref 12.0–15.0)
MCH: 31.3 pg (ref 26.0–34.0)
MCHC: 32.3 g/dL (ref 30.0–36.0)
MCV: 97 fL (ref 80.0–100.0)
Platelets: 330 10*3/uL (ref 150–400)
RBC: 5.08 MIL/uL (ref 3.87–5.11)
RDW: 14.5 % (ref 11.5–15.5)
WBC: 11.9 10*3/uL — ABNORMAL HIGH (ref 4.0–10.5)
nRBC: 0.2 % (ref 0.0–0.2)

## 2019-01-15 LAB — POCT I-STAT 7, (LYTES, BLD GAS, ICA,H+H)
Acid-Base Excess: 3 mmol/L — ABNORMAL HIGH (ref 0.0–2.0)
Bicarbonate: 33.9 mmol/L — ABNORMAL HIGH (ref 20.0–28.0)
Calcium, Ion: 1.25 mmol/L (ref 1.15–1.40)
HCT: 51 % — ABNORMAL HIGH (ref 36.0–46.0)
Hemoglobin: 17.3 g/dL — ABNORMAL HIGH (ref 12.0–15.0)
O2 Saturation: 92 %
Patient temperature: 98.6
Potassium: 4.8 mmol/L (ref 3.5–5.1)
Sodium: 136 mmol/L (ref 135–145)
TCO2: 36 mmol/L — ABNORMAL HIGH (ref 22–32)
pCO2 arterial: 81.4 mmHg (ref 32.0–48.0)
pH, Arterial: 7.228 — ABNORMAL LOW (ref 7.350–7.450)
pO2, Arterial: 78 mmHg — ABNORMAL LOW (ref 83.0–108.0)

## 2019-01-15 LAB — STREP PNEUMONIAE URINARY ANTIGEN: Strep Pneumo Urinary Antigen: NEGATIVE

## 2019-01-15 MED ORDER — MOMETASONE FURO-FORMOTEROL FUM 200-5 MCG/ACT IN AERO
2.0000 | INHALATION_SPRAY | Freq: Two times a day (BID) | RESPIRATORY_TRACT | Status: DC
  Administered 2019-01-15 – 2019-01-18 (×7): 2 via RESPIRATORY_TRACT
  Filled 2019-01-15 (×2): qty 8.8

## 2019-01-15 MED ORDER — ALBUTEROL (5 MG/ML) CONTINUOUS INHALATION SOLN
10.0000 mg/h | INHALATION_SOLUTION | RESPIRATORY_TRACT | Status: DC
  Administered 2019-01-15: 10 mg/h via RESPIRATORY_TRACT
  Filled 2019-01-15: qty 20

## 2019-01-15 MED ORDER — LEVOFLOXACIN IN D5W 500 MG/100ML IV SOLN
500.0000 mg | INTRAVENOUS | Status: DC
  Administered 2019-01-15 – 2019-01-16 (×2): 500 mg via INTRAVENOUS
  Filled 2019-01-15 (×2): qty 100

## 2019-01-15 MED ORDER — WHITE PETROLATUM EX OINT
1.0000 "application " | TOPICAL_OINTMENT | CUTANEOUS | Status: DC | PRN
  Filled 2019-01-15: qty 5

## 2019-01-15 NOTE — Progress Notes (Signed)
Pt wanting to come off BIPAP at this time. Pt taken off and placed back on [place]. Pt still with coarse wheeze/crackles throughout, but improved aeration. RT Will continue to monitor.

## 2019-01-15 NOTE — Progress Notes (Signed)
Pharmacy Antibiotic Note  Molly Dillon is a 59 y.o. female admitted on 01/14/2019 with ?PNA vs COPD exacerbation.  Pharmacy has been consulted for Levaquin dosing. WBC 11.4. Mild bump in Scr.   Plan: Levaquin 500 mg IV q24h Trend WBC, temp, renal function  F/U infectious work-up  Temp (24hrs), Avg:98.2 F (36.8 C), Min:97.5 F (36.4 C), Max:98.9 F (37.2 C)  Recent Labs  Lab 01/14/19 1841  WBC 11.4*  CREATININE 1.28*    CrCl cannot be calculated (Unknown ideal weight.).    Allergies  Allergen Reactions  . Dapsone Shortness Of Breath  . Penicillins Anaphylaxis    Has patient had a PCN reaction causing immediate rash, facial/tongue/throat swelling, SOB or lightheadedness with hypotension: Yes Has patient had a PCN reaction causing severe rash involving mucus membranes or skin necrosis: Yes Has patient had a PCN reaction that required hospitalization: No Has patient had a PCN reaction occurring within the last 10 years: No If all of the above answers are "NO", then may proceed with Cephalosporin use.     Narda Bonds, PharmD, BCPS Clinical Pharmacist Phone: [phone]

## 2019-01-15 NOTE — ED Notes (Signed)
SDU  Breakfast ordered  

## 2019-01-15 NOTE — ED Notes (Signed)
ED TO INPATIENT HANDOFF REPORT  ED Nurse Name and Phone #:   S Name/Age/Gender Molly Dillon 59 y.o. female Room/Bed: 026C/026C  Code Status   Code Status: Full Code  Home/SNF/Other Home Patient oriented to: self, place, time and situation Is this baseline? Yes   Triage Complete: Triage complete  Chief Complaint COPD exacerbation [facility]) [J44.1]  Triage Note Pt arrives via EMS from [facility] with reports of resp distress since 12/25. EMS gave 2 mag, pt on 4L. Audible wheezing noted. 80 solud-medrol given at [facility].     Allergies Allergies  Allergen Reactions  . Dapsone Shortness Of Breath  . Penicillins Anaphylaxis    Has patient had a PCN reaction causing immediate rash, facial/tongue/throat swelling, SOB or lightheadedness with hypotension: Yes Has patient had a PCN reaction causing severe rash involving mucus membranes or skin necrosis: Yes Has patient had a PCN reaction that required hospitalization: No Has patient had a PCN reaction occurring within the last 10 years: No If all of the above answers are "NO", then may proceed with Cephalosporin use.     Level of Care/Admitting Diagnosis ED Disposition    ED Disposition Condition [facility] Area: [place] [[id number]]  Level of Care: Progressive [102]  Admit to Progressive based on following criteria: RESPIRATORY PROBLEMS hypoxemic/hypercapnic respiratory failure that is responsive to NIPPV (BiPAP) or High Flow Nasal Cannula (6-80 lpm). Frequent assessment/intervention, no > Q2 hrs < Q4 hrs, to maintain oxygenation and pulmonary hygiene.  Covid Evaluation: Confirmed COVID Negative  Diagnosis: COPD exacerbation [facility]) [[id number]]  Admitting Physician: Kathrene Alu [[id number]]  Attending Physician: Madison Hickman A [[id number]]  Estimated length of stay: past midnight tomorrow  Certification:: I certify this patient will need inpatient services for at least 2 midnights       B Medical/Surgery  History Past Medical History:  Diagnosis Date  . Acute renal insufficiency 06/06/2014  . Asthma   . Constipation 07/19/2014  . COPD (chronic obstructive pulmonary disease) with acute bronchitis ([place]) 06/06/2014  . Depression   . Depression, major, recurrent, moderate ([place]) 04/02/2015  . HIV infection ([place])   . Hyperlipidemia   . Hypertension   . Hypertensive nephropathy 07/19/2014  . Unintentional weight loss 02/13/2015   History reviewed. No pertinent surgical history.   A IV Location/Drains/Wounds Patient Lines/Drains/Airways Status   Active Line/Drains/Airways    Name:   Placement date:   Placement time:   Site:   Days:   Peripheral IV 01/14/19 Left Antecubital   01/14/19    1731    Antecubital   1          Intake/Output Last 24 hours  Intake/Output Summary (Last 24 hours) at 01/15/2019 1520 Last data filed at 01/15/2019 0517 Gross per 24 hour  Intake 100 ml  Output --  Net 100 ml    Labs/Imaging Results for orders placed or performed during the hospital encounter of 01/14/19 (from the past 48 hour(s))  CBC     Status: Abnormal   Collection Time: 01/14/19  6:41 PM  Result Value Ref Range   WBC 11.4 (H) 4.0 - 10.5 K/uL   RBC 5.28 (H) 3.87 - 5.11 MIL/uL   Hemoglobin 16.6 (H) 12.0 - 15.0 g/dL   HCT 51.4 (H) 36.0 - 46.0 %   MCV 97.3 80.0 - 100.0 fL   MCH 31.4 26.0 - 34.0 pg   MCHC 32.3 30.0 - 36.0 g/dL   RDW 14.6 11.5 - 15.5 %  Platelets 336 150 - 400 K/uL   nRBC 0.0 0.0 - 0.2 %    Comment: Performed at [facility], [place] [address] [place], [place] [address]  Basic metabolic panel     Status: Abnormal   Collection Time: 01/14/19  6:41 PM  Result Value Ref Range   Sodium 136 135 - 145 mmol/L   Potassium 4.7 3.5 - 5.1 mmol/L   Chloride 99 98 - 111 mmol/L   CO2 27 22 - 32 mmol/L   Glucose, Bld 137 (H) 70 - 99 mg/dL   BUN 20 6 - 20 mg/dL   Creatinine, Ser 1.28 (H) 0.44 - 1.00 mg/dL   Calcium 9.2 8.9 - 10.3 mg/dL   GFR calc non Af Amer 46 (L) >60 mL/min   GFR  calc Af Amer 54 (L) >60 mL/min   Anion gap 10 5 - 15    Comment: Performed at [facility] [address] [place], [place] [address]  Troponin I (High Sensitivity)     Status: None   Collection Time: 01/14/19  6:41 PM  Result Value Ref Range   Troponin I (High Sensitivity) 17 <18 ng/L    Comment: (NOTE) Elevated high sensitivity troponin I (hsTnI) values and significant  changes across serial measurements may suggest ACS but many other  chronic and acute conditions are known to elevate hsTnI results.  Refer to the "Links" section for chest pain algorithms and additional  guidance. Performed at [facility], [place] [address] [place],  [address]   Brain natriuretic peptide     Status: Abnormal   Collection Time: 01/14/19  6:41 PM  Result Value Ref Range   B Natriuretic Peptide 144.7 (H) 0.0 - 100.0 pg/mL    Comment: Performed at [facility] [address], [place],  [address]  POC SARS Coronavirus 2 Ag-ED - Nasal Swab (BD Veritor Kit)     Status: None   Collection Time: 01/14/19  7:08 PM  Result Value Ref Range   SARS Coronavirus 2 Ag NEGATIVE NEGATIVE    Comment: (NOTE) SARS-CoV-2 antigen NOT DETECTED.  Negative results are presumptive.  Negative results do not preclude SARS-CoV-2 infection and should not be used as the sole basis for treatment or other patient management decisions, including infection  control decisions, particularly in the presence of clinical signs and  symptoms consistent with COVID-19, or in those who have been in contact with the virus.  Negative results must be combined with clinical observations, patient history, and epidemiological information. The expected result is Negative. Fact Sheet for Patients: PodPark.tn Fact Sheet for Healthcare Providers: GiftContent.is This test is not yet approved or cleared by the [place] FDA and  has been authorized for  detection and/or diagnosis of SARS-CoV-2 by FDA under an Emergency Use Authorization (EUA).  This EUA will remain in effect (meaning this test can be used) for the duration of  the COVID-19 de claration under Section 564(b)(1) of the Act, 21 U.S.C. section 360bbb-3(b)(1), unless the authorization is terminated or revoked sooner.   Respiratory Panel by RT PCR (Flu A&B, Covid) - Nasopharyngeal Swab     Status: None   Collection Time: 01/14/19  9:20 PM   Specimen: Nasopharyngeal Swab  Result Value Ref Range   SARS Coronavirus 2 by RT PCR NEGATIVE NEGATIVE    Comment: (NOTE) SARS-CoV-2 target nucleic acids are NOT DETECTED. The SARS-CoV-2 RNA is generally detectable in upper respiratoy specimens during the acute phase of  infection. The lowest concentration of SARS-CoV-2 viral copies this assay can detect is 131 copies/mL. A negative result does not preclude SARS-Cov-2 infection and should not be used as the sole basis for treatment or other patient management decisions. A negative result may occur with  improper specimen collection/handling, submission of specimen other than nasopharyngeal swab, presence of viral mutation(s) within the areas targeted by this assay, and inadequate number of viral copies (<131 copies/mL). A negative result must be combined with clinical observations, patient history, and epidemiological information. The expected result is Negative. Fact Sheet for Patients:  PinkCheek.be Fact Sheet for Healthcare Providers:  GravelBags.it This test is not yet ap proved or cleared by the [place] FDA and  has been authorized for detection and/or diagnosis of SARS-CoV-2 by FDA under an Emergency Use Authorization (EUA). This EUA will remain  in effect (meaning this test can be used) for the duration of the COVID-19 declaration under Section 564(b)(1) of the Act, 21 U.S.C. section 360bbb-3(b)(1), unless the  authorization is terminated or revoked sooner.    Influenza A by PCR NEGATIVE NEGATIVE   Influenza B by PCR NEGATIVE NEGATIVE    Comment: (NOTE) The Xpert Xpress SARS-CoV-2/FLU/RSV assay is intended as an aid in  the diagnosis of influenza from Nasopharyngeal swab specimens and  should not be used as a sole basis for treatment. Nasal washings and  aspirates are unacceptable for Xpert Xpress SARS-CoV-2/FLU/RSV  testing. Fact Sheet for Patients: PinkCheek.be Fact Sheet for Healthcare Providers: GravelBags.it This test is not yet approved or cleared by the [place] FDA and  has been authorized for detection and/or diagnosis of SARS-CoV-2 by  FDA under an Emergency Use Authorization (EUA). This EUA will remain  in effect (meaning this test can be used) for the duration of the  Covid-19 declaration under Section 564(b)(1) of the Act, 21  U.S.C. section 360bbb-3(b)(1), unless the authorization is  terminated or revoked. Performed at [facility], [place] [address] [place], [place] [address]   Troponin I (High Sensitivity)     Status: Abnormal   Collection Time: 01/14/19  9:38 PM  Result Value Ref Range   Troponin I (High Sensitivity) 20 (H) <18 ng/L    Comment: (NOTE) Elevated high sensitivity troponin I (hsTnI) values and significant  changes across serial measurements may suggest ACS but many other  chronic and acute conditions are known to elevate hsTnI results.  Refer to the "Links" section for chest pain algorithms and additional  guidance. Performed at [facility], [place] [address] [place], [place] [address]   I-STAT 7, (LYTES, BLD GAS, ICA, H+H)     Status: Abnormal   Collection Time: 01/15/19  1:06 AM  Result Value Ref Range   pH, Arterial 7.228 (L) 7.350 - 7.450   pCO2 arterial 81.4 (HH) 32.0 - 48.0 mmHg   pO2, Arterial 78.0 (L) 83.0 - 108.0 mmHg   Bicarbonate 33.9 (H) 20.0 - 28.0 mmol/L   TCO2 36  (H) 22 - 32 mmol/L   O2 Saturation 92.0 %   Acid-Base Excess 3.0 (H) 0.0 - 2.0 mmol/L   Sodium 136 135 - 145 mmol/L   Potassium 4.8 3.5 - 5.1 mmol/L   Calcium, Ion 1.25 1.15 - 1.40 mmol/L   HCT 51.0 (H) 36.0 - 46.0 %   Hemoglobin 17.3 (H) 12.0 - 15.0 g/dL   Patient temperature 98.6 F    Collection site RADIAL, ALLEN'S TEST ACCEPTABLE    Drawn by Mining engineer  Sample type ARTERIAL    Comment NOTIFIED PHYSICIAN   Basic metabolic panel     Status: Abnormal   Collection Time: 01/15/19  4:45 AM  Result Value Ref Range   Sodium 137 135 - 145 mmol/L   Potassium 4.3 3.5 - 5.1 mmol/L   Chloride 94 (L) 98 - 111 mmol/L   CO2 30 22 - 32 mmol/L   Glucose, Bld 178 (H) 70 - 99 mg/dL   BUN 25 (H) 6 - 20 mg/dL   Creatinine, Ser 1.26 (H) 0.44 - 1.00 mg/dL   Calcium 9.4 8.9 - 10.3 mg/dL   GFR calc non Af Amer 47 (L) >60 mL/min   GFR calc Af Amer 55 (L) >60 mL/min   Anion gap 13 5 - 15    Comment: Performed at [facility] [address], [place], [place] [address]  CBC     Status: Abnormal   Collection Time: 01/15/19  4:45 AM  Result Value Ref Range   WBC 11.9 (H) 4.0 - 10.5 K/uL   RBC 5.08 3.87 - 5.11 MIL/uL   Hemoglobin 15.9 (H) 12.0 - 15.0 g/dL   HCT 49.3 (H) 36.0 - 46.0 %   MCV 97.0 80.0 - 100.0 fL   MCH 31.3 26.0 - 34.0 pg   MCHC 32.3 30.0 - 36.0 g/dL   RDW 14.5 11.5 - 15.5 %   Platelets 330 150 - 400 K/uL   nRBC 0.2 0.0 - 0.2 %    Comment: Performed at [facility], [place] [address], [place], [place] [address]   DG Chest Portable 1 View  Result Date: 01/14/2019 CLINICAL DATA:  Shortness of breath. EXAM: PORTABLE CHEST 1 VIEW COMPARISON:  Radiograph 09/28/2017. CT 05/04/2017 FINDINGS: Emphysema with subsequent basilar crowding, unchanged from prior exam. No acute airspace disease. Normal heart size and mediastinal contours. No pleural fluid or pneumothorax. No acute osseous abnormalities. IMPRESSION: Emphysema with subsequent basilar crowding, unchanged from prior exam. No  superimposed acute abnormality. Electronically Signed   By: Keith Rake M.D.   On: 01/14/2019 22:48    Pending Labs Unresulted Labs (From admission, onward)    Start     Ordered   01/15/19 1310  T-helper cells (CD4) count (not at [facility])  Once,   STAT     01/15/19 1309   01/15/19 0230  Legionella Pneumophila Serogp 1 Ur Ag  Once,   STAT     01/15/19 0229   01/15/19 0000  Expectorated sputum assessment w rflx to resp cult  Once,   R    Question:  Patient immune status  Answer:  Immunocompromised   01/14/19 2359   01/14/19 2356  T-helper cells (CD4) count (not at [facility])  Once,   STAT     01/14/19 2359   01/14/19 2356  Strep pneumoniae urinary antigen  Once,   STAT     01/14/19 2359   01/14/19 2244  Blood gas, arterial (at [facility])  ONCE - STAT,   STAT     01/14/19 2243          Vitals/Pain Today's Vitals   01/15/19 1330 01/15/19 1340 01/15/19 1400 01/15/19 1500  BP: 115/75  (!) 148/83 113/72  Pulse:    90  Resp:      Temp:      TempSrc:      SpO2:    95%  PainSc:  0-No pain      Isolation Precautions No active isolations  Medications Medications  methylPREDNISolone sodium succinate (SOLU-MEDROL) 125 mg/2 mL injection 45 mg (45 mg Intravenous Not Given 01/14/19 2228)  bictegravir-emtricitabine-tenofovir AF (BIKTARVY) 50-200-25 MG per tablet 1 tablet (1 tablet Oral Given 01/15/19 0940)  amLODipine (NORVASC) tablet 10 mg (10 mg Oral Given 01/15/19 0941)  pravastatin (PRAVACHOL) tablet 20 mg (20 mg Oral Given 01/15/19 0941)  DULoxetine (CYMBALTA) DR capsule 60 mg (60 mg Oral Given 01/15/19 0940)  nicotine (NICODERM CQ - dosed in mg/24 hours) patch 21 mg (has no administration in time range)  pregabalin (LYRICA) capsule 75 mg (75 mg Oral Given 01/15/19 0940)  enoxaparin (LOVENOX) injection 40 mg (40 mg Subcutaneous Given 01/15/19 0940)  predniSONE (DELTASONE) tablet 40 mg (40 mg Oral Given 01/15/19 0739)  ipratropium-albuterol (DUONEB) 0.5-2.5 (3) MG/3ML nebulizer solution 3 mL (3 mLs  Nebulization Given 01/15/19 1155)  albuterol (PROVENTIL) (2.5 MG/3ML) 0.083% nebulizer solution 2.5 mg (has no administration in time range)  albuterol (PROVENTIL,VENTOLIN) solution continuous neb (0 mg/hr Nebulization Stopped 01/15/19 0410)  levofloxacin (LEVAQUIN) IVPB 500 mg (0 mg Intravenous Stopped 01/15/19 0517)  mometasone-formoterol (DULERA) 200-5 MCG/ACT inhaler 2 puff (2 puffs Inhalation Given 01/15/19 0844)  white petrolatum (VASELINE) gel 1 application (has no administration in time range)  albuterol (VENTOLIN HFA) 108 (90 Base) MCG/ACT inhaler 2 puff (2 puffs Inhalation Given 01/14/19 2052)  ipratropium (ATROVENT HFA) inhaler 2 puff (2 puffs Inhalation Given 01/14/19 2052)  ipratropium-albuterol (DUONEB) 0.5-2.5 (3) MG/3ML nebulizer solution 3 mL (3 mLs Nebulization Given 01/14/19 2147)  albuterol (PROVENTIL,VENTOLIN) solution continuous neb (10 mg/hr Nebulization Given 01/15/19 0005)    Mobility walks Low fall risk   Focused Assessments Pulmonary Assessment Handoff:  Lung sounds: Bilateral Breath Sounds: Coarse crackles O2 Device: Nasal Cannula O2 Flow Rate (L/min): 8 L/min      R Recommendations: See Admitting Provider Note  Report given to:   Additional Notes:

## 2019-01-15 NOTE — ED Notes (Signed)
Patient ate about 30% and had 3 cups of water and ice

## 2019-01-15 NOTE — ED Notes (Signed)
Patient c/o irritation to nares from high flow O2, admitting MD paged to request humidified O2 for comfort.

## 2019-01-15 NOTE — ED Notes (Signed)
Pt provided sandwich and crackers

## 2019-01-15 NOTE — Progress Notes (Signed)
Text page sent to Gladys Damme, MD regarding ABG results. RT Will continue to monitor.

## 2019-01-15 NOTE — ED Notes (Signed)
Breakfast tray set up at bedside  

## 2019-01-15 NOTE — ED Notes (Signed)
Patient placed on humidified O2 for comfort. Remains at 8L via n.c.

## 2019-01-15 NOTE — Discharge Summary (Addendum)
[facility] Discharge Summary  Patient name: Molly Dillon Medical record number: [id number] Date of birth: Jul 24, 1960 Age: 59 y.o. Gender: female Date of Admission: 01/14/2019  Date of Discharge: 01/18/2019 Admitting Physician: Zenia Resides, MD  Primary Care Provider: Gerlene Fee, DO Consultants: None  Indication for Hospitalization: Acute on chronic hypoxic hypercarbic respiratory failure  Discharge Diagnoses/Problem List:  COPD HIV Tobacco use Hypertension CKD stage III Chronic pain  Disposition: Home  Discharge Condition: Stable  Discharge Exam:  General: Alert and oriented in no apparent distress Heart: S1, S2 with no murmurs appreciated Lungs: Wheezing noted improved from yesterday, breathing well on 3 L nasal cannula.  No respiratory distress. Skin: Warm and dry Extremities: No lower extremity edema   Brief Hospital Course:   COPD exacerbation:  Ms. Hady was admitted on January 2 due to acute on chronic respiratory failure likely due to a COPD exacerbation.  ABG was concerning on admission with pH of 7.22, PaCO2 of 80, PaO2 of 78.  Covid testing was negative, as was influenza A and B.  CXR was not notable for any acute finding but did show chronic emphysema.  She was placed on IV Levaquin, steroids, and continuous albuterol therapy.  Her home oxygen requirement is 2 L intermittently, and she was saturating in the high 80s on 8 L on admission.  Her CD4 count from August 2020 was just under 400, so opportunistic infections were less likely, however she was at increased risk of pneumonia and complications from pneumonia.  A CD4 count was repeated and was 337.  Urine antigens for strep pneumoniae and Legionella were also collected and were negative.  Patient also had an A1c checked during her hospitalization which came out to 6.3.  During her hospitalization she continued to remain on 7-8 L via high flow nasal cannula satting well.  She  slowly progressed with improving wheezing and upon discharge she was down to 3liters nasal cannula.  Patient was discharged home triple therapy with [facility] and [facility].  HIV: Current CD4 count 331.  During hospitalization patient was recommended continue home [place] for Follow Up:  PCP recommend follow-up on A1c of 6.3, prediabetes. PCP recommend follow-up on patient's asthma, discharged home on [place]. PCP recommend providing patient's assistance as needed for tobacco cessation  Significant Procedures: None  Significant Labs and Imaging:  Recent Labs  Lab 01/14/19 1841 01/15/19 0106 01/15/19 0445 01/17/19 0446  WBC 11.4*  --  11.9* 12.7*  HGB 16.6* 17.3* 15.9* 15.9*  HCT 51.4* 51.0* 49.3* 49.3*  PLT 336  --  330 373   Recent Labs  Lab 01/14/19 1841 01/15/19 0106 01/15/19 0445 01/17/19 0446 01/17/19 0835  NA 136 136 137 143  --   K 4.7 4.8 4.3 5.3* 4.1  CL 99  --  94* 99  --   CO2 27  --  30 31  --   GLUCOSE 137*  --  178* 77  --   BUN 20  --  25* 38*  --   CREATININE 1.28*  --  1.26* 1.39*  --   CALCIUM 9.2  --  9.4 9.9  --      Results/Tests Pending at Time of Discharge: None  Discharge Medications:  Allergies as of 01/18/2019       Reactions   Dapsone Shortness Of Breath   Penicillins Anaphylaxis   Has patient had a PCN reaction causing immediate rash, facial/tongue/throat swelling, SOB or lightheadedness with hypotension: Yes  Has patient had a PCN reaction causing severe rash involving mucus membranes or skin necrosis: Yes Has patient had a PCN reaction that required hospitalization: No Has patient had a PCN reaction occurring within the last 10 years: No If all of the above answers are "NO", then may proceed with Cephalosporin use.        Medication List     STOP taking these medications    oxyCODONE-acetaminophen 10-325 MG tablet Commonly known as: PERCOCET       TAKE these medications    albuterol 108 (90 Base) MCG/ACT  inhaler Commonly known as: VENTOLIN HFA Inhale 2 puffs into the lungs every 6 (six) hours as needed for wheezing.   amLODipine 10 MG tablet Commonly known as: NORVASC TAKE 1 TABLET BY MOUTH ONCE A DAY   Biktarvy 50-200-25 MG Tabs tablet Generic drug: bictegravir-emtricitabine-tenofovir AF TAKE 1 TABLET BY MOUTH DAILY.   diclofenac sodium 1 % Gel Commonly known as: VOLTAREN Apply 2 g topically 4 (four) times daily as needed (pain).   DULoxetine 30 MG capsule Commonly known as: CYMBALTA TAKE 2 CAPSULES (60 MG TOTAL) BY MOUTH DAILY.   gentamicin cream 0.1 % Commonly known as: GARAMYCIN Apply 1 application topically 3 (three) times daily. What changed:  when to take this reasons to take this   levofloxacin 500 MG tablet Commonly known as: LEVAQUIN Take 1 tablet (500 mg total) by mouth daily. Start taking on: January 19, 2019   mometasone-formoterol 200-5 MCG/ACT Aero Commonly known as: DULERA Inhale 2 puffs into the lungs 2 (two) times daily.   nicotine 21 mg/24hr patch Commonly known as: NICODERM CQ - dosed in mg/24 hours Place 1 patch (21 mg total) onto the skin daily. What changed:  when to take this reasons to take this   pravastatin 20 MG tablet Commonly known as: PRAVACHOL TAKE 1 TABLET (20 MG TOTAL) BY MOUTH DAILY What changed: See the new instructions.   predniSONE 20 MG tablet Commonly known as: DELTASONE Take 2 tablets (40 mg total) by mouth daily with breakfast. Start taking on: January 19, 2019   pregabalin 75 MG capsule Commonly known as: LYRICA TAKE 1 CAPSULE (75 MG TOTAL) BY MOUTH 2 (TWO) TIMES DAILY.   umeclidinium bromide 62.5 MCG/INH Aepb Commonly known as: INCRUSE ELLIPTA Inhale 1 puff into the lungs daily.        Discharge Instructions: Please refer to Patient Instructions section of EMR for full details.  Patient was counseled important signs and symptoms that should prompt return to medical care, changes in medications, dietary  instructions, activity restrictions, and follow up appointments.   Follow-Up Appointments: Follow-up Information     Autry-Lott, Naaman Plummer, DO. Go on 01/20/2019.   Specialty: Family Medicine Why: at 11:10am.  Please arrive 10 minutes early. Contact information: [address] [place] [address] [phone]         Tommy Medal, Lavell Islam, MD.   Specialty: Infectious Diseases Contact information: [place]. [place] [address] [phone]         Nuala Alpha, DO. Go on 01/23/2019.   Specialty: Family Medicine Why: @ 1:50PM Please arrive 10 minutes early Contact information: [id number] [place] [address] [phone]            Bonnita Hollow, MD 01/18/2019, 1:53 PM

## 2019-01-15 NOTE — Progress Notes (Addendum)
FPTS Interim Progress Note  Received call from [facility] regarding ABG results: 7.22 pH, 81.4 arterial O2, 78 arterial CO2, 33.9 bicarb, 92% SPO2 on 7 L via nasal cannula. RRTI reports she gave patient continuous albuterol 10 around midnight. We will start BiPAP and continuous albuterol.  Gladys Damme, MD 01/15/2019, 1:41 AM PGY-, [place] Service pager [phone]

## 2019-01-15 NOTE — Progress Notes (Signed)
Family Medicine Teaching Service Daily Progress Note Intern Pager: [phone]  Patient name: Molly Dillon Medical record number: [id number] Date of birth: 1960/02/19 Age: 59 y.o. Gender: female  Primary Care Provider: Gerlene Fee, DO Consultants: none Code Status: full  Pt Overview and Major Events to Date:  1/2 - admitted for COPD exacerbation  Assessment and Plan: [name] is a 59 y.o. female presenting with shortness of breath and increased O2 requirement. PMH is significant for HIV, hyperlipidemia, hypertension, tobacco abuse, COPD with asthma, MDD, CKD stage III, chronic pain.  AoC hypoxic hypercarbic respiratory failure Likely 2/2 COPD exacerbation vs CAP. COVID, flu negative. CXR on admission with stable emphysema. Did have initial concern of possible PE given resp distress and tachycardia however these have improved with steroids and breathing treatments, so unlikely. S/p solumedrol, continues on prednisone. Concern for increased risk of mortality given HIV+, legionella, sputum culture, S. pneumo testing pending. Continues on levaquin. Feeling much improved this am after BiPAP last night, able to wean to 6L and maintain sats. WBC stable.   Continuous pulse oximetry  Oxygen therapy with goal of SpO2 88-94%  Continue levaquin (1/3 - )  F/U sputum gram stain with reflex culture  S/p Solumedrol x 1 at UC. Continue Prednisone 75m x 5 days.   Duonebs q4 hours with albuterol neb q2 hours PRN.   Dulera as Advair is not on formulary.  Expectorant gram stain  Can consider ID consult for antibiotic coverage if worsening symptoms  Recommend flu vaccine if not already received.  COPD with asthma Albuterol andAdvair Diskus daily, current smoker. GOLD stage D.  Advair not on formulary, will sub Dulera while in patient.   Exacerbation meds as above.  HIV, stable Diagnosed in 1993. Last CD4 count was in 08/2018 at 393, last viral load at same time was <20 not  detectable.  Continue home Biktarvy  CD4 count  Hypertension, chronic Normotensive this am.   Continue home amlodipine 10 mg  Continue to monitor  Hyperlipidemi/a, chronic Last lipid profile 08/2018 Tchol 204, HDL 46, LDL 124, TG 22. ASCVD risk is 11.6%.  Continue home pravastatin 20 mg.  Tobacco abuse Patient continues to smoke cigarettes though has decreased the amount per day, she has not smoked since before Christmas due to not feeling well.  Nicotine patch ordered  Patient would benefit from further counseling as smoking in the setting of HIV doubles risk of pulmonary infections   CKD, stable Cr stable, continues to be below baseline of 1.5. eGFR 55.  Avoid nephrotoxic agents   AM BMP  Chronic pain, chronic At home, Cymbalta60 mg, Lyrica 75 mg, Voltaren gel. No complaints today.   Continue cymbalta and lyrica  FEN/GI: regular diet Prophylaxis: Lovenox  Disposition: continue inpt management  Subjective:  Feeling much improved this am after BiPAP and breathing treatments.   Objective: Temp:  [97.5 F (36.4 C)-98.9 F (37.2 C)] 97.5 F (36.4 C) (01/02 1831) Pulse Rate:  [87-99] 91 (01/03 0700) Resp:  [21-32] 24 (01/03 0300) BP: (134-163)/(78-92) 134/83 (01/03 0730) SpO2:  [88 %-100 %] 95 % (01/03 0700) FiO2 (%):  [50 %] 50 % (01/03 0307) Physical Exam: General: pleasant female, sitting up in bed, in NAD Cardiovascular: RRR, no murmur Respiratory: inspiratory and expiratory wheezing, appropriately saturated on 6L [place], normal WOB Abdomen: +BS, NTND Extremities: no edema  Laboratory: Recent Labs  Lab 01/14/19 1841 01/15/19 0106 01/15/19 0445  WBC 11.4*  --  11.9*  HGB 16.6* 17.3* 15.9*  HCT 51.4* 51.0*  49.3*  PLT 336  --  330   Recent Labs  Lab 01/14/19 1841 01/15/19 0106 01/15/19 0445  NA 136 136 137  K 4.7 4.8 4.3  CL 99  --  94*  CO2 27  --  30  BUN 20  --  25*  CREATININE 1.28*  --  1.26*  CALCIUM 9.2  --  9.4  GLUCOSE  137*  --  178*    Imaging/Diagnostic Tests: DG Chest Portable 1 View  Result Date: 01/14/2019 CLINICAL DATA:  Shortness of breath. EXAM: PORTABLE CHEST 1 VIEW COMPARISON:  Radiograph 09/28/2017. CT 05/04/2017 FINDINGS: Emphysema with subsequent basilar crowding, unchanged from prior exam. No acute airspace disease. Normal heart size and mediastinal contours. No pleural fluid or pneumothorax. No acute osseous abnormalities. IMPRESSION: Emphysema with subsequent basilar crowding, unchanged from prior exam. No superimposed acute abnormality. Electronically Signed   By: Keith Rake M.D.   On: 01/14/2019 22:48   Rory Percy, DO 01/15/2019, 7:55 AM PGY-3, [place] Intern pager: [phone], text pages welcome

## 2019-01-16 ENCOUNTER — Encounter (HOSPITAL_COMMUNITY): Payer: Self-pay | Admitting: Family Medicine

## 2019-01-16 DIAGNOSIS — Z21 Asymptomatic human immunodeficiency virus [HIV] infection status: Secondary | ICD-10-CM

## 2019-01-16 DIAGNOSIS — R32 Unspecified urinary incontinence: Secondary | ICD-10-CM | POA: Diagnosis not present

## 2019-01-16 DIAGNOSIS — J449 Chronic obstructive pulmonary disease, unspecified: Secondary | ICD-10-CM

## 2019-01-16 DIAGNOSIS — G894 Chronic pain syndrome: Secondary | ICD-10-CM

## 2019-01-16 DIAGNOSIS — N189 Chronic kidney disease, unspecified: Secondary | ICD-10-CM

## 2019-01-16 DIAGNOSIS — J9621 Acute and chronic respiratory failure with hypoxia: Principal | ICD-10-CM

## 2019-01-16 DIAGNOSIS — F17210 Nicotine dependence, cigarettes, uncomplicated: Secondary | ICD-10-CM

## 2019-01-16 DIAGNOSIS — E875 Hyperkalemia: Secondary | ICD-10-CM

## 2019-01-16 DIAGNOSIS — I10 Essential (primary) hypertension: Secondary | ICD-10-CM

## 2019-01-16 LAB — T-HELPER CELLS (CD4) COUNT (NOT AT ARMC)
CD4 % Helper T Cell: 18 % — ABNORMAL LOW (ref 33–65)
CD4 T Cell Abs: 337 /uL — ABNORMAL LOW (ref 400–1790)

## 2019-01-16 LAB — HEMOGLOBIN A1C
Hgb A1c MFr Bld: 6.3 % — ABNORMAL HIGH (ref 4.8–5.6)
Mean Plasma Glucose: 134.11 mg/dL

## 2019-01-16 MED ORDER — IPRATROPIUM-ALBUTEROL 0.5-2.5 (3) MG/3ML IN SOLN
3.0000 mL | Freq: Four times a day (QID) | RESPIRATORY_TRACT | Status: DC
  Administered 2019-01-16: 3 mL via RESPIRATORY_TRACT
  Filled 2019-01-16: qty 3

## 2019-01-16 MED ORDER — ENSURE ENLIVE PO LIQD
237.0000 mL | Freq: Two times a day (BID) | ORAL | Status: DC
  Administered 2019-01-16 – 2019-01-18 (×3): 237 mL via ORAL

## 2019-01-16 MED ORDER — LEVOFLOXACIN 500 MG PO TABS
500.0000 mg | ORAL_TABLET | Freq: Every day | ORAL | Status: DC
  Administered 2019-01-17 – 2019-01-18 (×2): 500 mg via ORAL
  Filled 2019-01-16 (×2): qty 1

## 2019-01-16 MED ORDER — NICOTINE 14 MG/24HR TD PT24
14.0000 mg | MEDICATED_PATCH | Freq: Every day | TRANSDERMAL | Status: DC | PRN
  Administered 2019-01-17: 14 mg via TRANSDERMAL
  Filled 2019-01-16: qty 1

## 2019-01-16 MED ORDER — ADULT MULTIVITAMIN W/MINERALS CH
1.0000 | ORAL_TABLET | Freq: Every day | ORAL | Status: DC
  Administered 2019-01-16 – 2019-01-17 (×2): 1 via ORAL
  Filled 2019-01-16 (×2): qty 1

## 2019-01-16 NOTE — Progress Notes (Addendum)
Family Medicine Teaching Service Daily Progress Note Intern Pager: [phone]  Patient name: Molly Dillon Medical record number: [id number] Date of birth: [birth year] Age: 59 y.o. Gender: female  Primary Care Provider: Gerlene Fee, DO Consultants: none Code Status: full  Pt Overview and Major Events to Date:  1/2 - admitted for COPD exacerbation  Assessment and Plan: Keelin Neville is a 58 y.o. female presenting with shortness of breath and increased O2 requirement. PMH is significant for HIV, hyperlipidemia, hypertension, tobacco abuse, COPD with asthma, MDD, CKD stage III, chronic pain.  Acute on chronic hypoxic, hypercarbic respiratory failure Likely secondary to COPD exacerbation versus community-acquired pneumonia.  Covid and flu were both negative. CXR on admission with stable emphysema. Did have initial concern of possible PE given resp distress and tachycardia however these improved with steroids and breathing treatments.  S/p solumedrol, continues on prednisone. Concern for increased risk of mortality given HIV+, legionella, sputum culture, S. pneumo testing pending. Continues on levaquin.  Patient currently on 9 L high flow nasal cannula.  We will plan to try to wean oxygen as tolerated. -Continuous pulse ox  -Oxygen therapy, goal of 88-94% -Continue Levaquin (day 1/3- ) -Follow-up on sputum Gram stain reflex culture -Status post Solu-Medrol x1 at urgent care.  Continue prednisone 40 mg for 5 days -We will check A1c -Albuterol neb every 2 hours as needed -Patient using Dulera as Advair is nonformulary -Expectorant gram stain -Can consider ID consult for antibiotic coverage if worsening symptoms -Recommend flu vaccine if not already received. Post rounds addendum: Duo nebs scheduled every 6 hours.  We will plan to start Incruse as we decrease duo nebs containing ipratropium.  COPD with asthma Albuterol andAdvair Diskus daily, current smoker. GOLD stage D.  Advair not  on formulary, will sub Dulera while in patient.   Exacerbation meds as above.  HIV, stable Diagnosed in 1993. Last CD4 count was in 08/2018 at 393, last viral load at same time was <20 not detectable. -Continue home [place] -Follow-up on CD4 count  Hypertension, chronic Most recent BP 131/78.  Continue home amlodipine 10 mg -Continue to monitor  Hyperlipidemi/a, chronic Last lipid profile 08/2018 Tchol 204, HDL 46, LDL 124, TG 22. ASCVD risk is 11.6%.  Home medication include pravastatin 20 mg. -Continue home pravastatin.  Tobacco abuse Patient continues to smoke cigarettes though has decreased the amount per day, she has not smoked since before Christmas due to not feeling well.  Nicotine patch ordered  Patient would benefit from further counseling as smoking in the setting of HIV doubles risk of pulmonary infections   CKD, stable Cr stable with most recent 1.26, continues to be below baseline of 1.5. eGFR 55. - Avoid nephrotoxic agents    Chronic pain, chronic At home, Cymbalta60 mg, Lyrica 75 mg, Voltaren gel. No complaints today.   Continue cymbalta and lyrica  FEN/GI: regular diet Prophylaxis: Lovenox  Disposition: continue inpt management  Subjective:  Patient states she feels her breathing is improving.  Upon seeing her this morning she had her nasal cannula down below her chin as she was eating breakfast.  She had no complaints of shortness of breath that time.  I requested the patient that she keep her nasal cannula in proper position to ensure that she is getting enough oxygen.  Patient states she did have some cough overnight.  Objective: Temp:  [97.6 F (36.4 C)-98.5 F (36.9 C)] 98.5 F (36.9 C) (01/04 0514) Pulse Rate:  [82-96] 91 (01/04 0514) Resp:  [16-24] 18 (  01/04 0514) BP: (109-148)/(68-98) 131/78 (01/04 0514) SpO2:  [90 %-99 %] 94 % (01/04 0514) Weight:  [68 kg-68.4 kg] 68.4 kg (01/04 0514) Physical Exam: General: Alert and oriented  in no apparent distress, nasal cannula present Heart: Regular rate and rhythm with no murmurs appreciated Lungs: Some mild wheezing present, breath sounds faint, no respiratory distress Abdomen: Bowel sounds present, no abdominal pain  Laboratory: Recent Labs  Lab 01/14/19 1841 01/15/19 0106 01/15/19 0445  WBC 11.4*  --  11.9*  HGB 16.6* 17.3* 15.9*  HCT 51.4* 51.0* 49.3*  PLT 336  --  330   Recent Labs  Lab 01/14/19 1841 01/15/19 0106 01/15/19 0445  NA 136 136 137  K 4.7 4.8 4.3  CL 99  --  94*  CO2 27  --  30  BUN 20  --  25*  CREATININE 1.28*  --  1.26*  CALCIUM 9.2  --  9.4  GLUCOSE 137*  --  178*    Imaging/Diagnostic Tests: No results found. Lurline Del, DO 01/16/2019, 5:59 AM PGY-1, [place] Intern pager: [phone], text pages welcome

## 2019-01-16 NOTE — Progress Notes (Signed)
Initial Nutrition Assessment  DOCUMENTATION CODES:   Not applicable  INTERVENTION:   -MVI with minerals daily -Ensure Enlive po BID, each supplement provides 350 kcal and 20 grams of protein -Magic cup BID with meals, each supplement provides 290 kcal and 9 grams of protein  NUTRITION DIAGNOSIS:   Increased nutrient needs related to chronic illness(COPD, HIV) as evidenced by estimated needs.  GOAL:   Patient will meet greater than or equal to 90% of their needs  MONITOR:   PO intake, Supplement acceptance, Labs, Weight trends, Skin, I & O's  REASON FOR ASSESSMENT:   Malnutrition Screening Tool    ASSESSMENT:   Molly Dillon is a 59 y.o. female presenting with shortness of breath and increased O2 requirement. PMH is significant for  HIV, hyperlipidemia, hypertension, tobacco abuse, COPD with asthma, MDD, CKD stage III, chronic pain.  Pt admitted with COPD exacerbation.   Reviewed I/O's: -300 ml x 24 hours and -200 ml since admission  UOP: 300 ml x 24 hours  Spoke with pt at bedside, who was pleasant and in good spirits today. She reports feeling better since being admitted to the hospital. Pt endorses poor appetite over the past several months secondary to early satiety and decreased desire to eat. Pt reports that she tries to consumes 2-3 meals per day, but often consumes very familiar foods to her (boiled meats, greens, and mac and cheese). She endorses consuming about 75% of her breakfast this AM. Although she is missing multiple teeth, she denies any difficulty with chewing or swallowing hospital food. Offered to downgrade diet for ease of intake, however, pt prefers to pick items off menu that she feels she can tolerate.   Pt reports UBW is around 150#. She though she had lost weight PTA due to decreased appetite, but was surprised to see her weight was stable. Reviewed wt hx; noted pt has experienced a 12.3% wt loss over the past 11 months. While this is not consistent  for time frame, it is concerning given decreased oral intake and multiple chronic illnesses (ex COPD and HIV). Discussed importance of good meal and supplement intake to promote healing. Pt amenable to oral nutritional supplements to help meet estimated nutritional needs.   Medications reviewed and include biktarvy and prednisone.   Labs reviewed.   NUTRITION - FOCUSED PHYSICAL EXAM:    Most Recent Value  Orbital Region  Mild depletion  Upper Arm Region  No depletion  Thoracic and Lumbar Region  No depletion  Buccal Region  No depletion  Temple Region  Mild depletion  Clavicle Bone Region  No depletion  Clavicle and Acromion Bone Region  No depletion  Scapular Bone Region  No depletion  Dorsal Hand  No depletion  Patellar Region  No depletion  Anterior Thigh Region  No depletion  Posterior Calf Region  No depletion  Edema (RD Assessment)  None  Hair  Reviewed  Eyes  Reviewed  Mouth  Reviewed  Skin  Reviewed  Nails  Reviewed       Diet Order:   Diet Order            Diet regular Room service appropriate? Yes; Fluid consistency: Thin  Diet effective now              EDUCATION NEEDS:   Education needs have been addressed  Skin:  Skin Assessment: Reviewed RN Assessment  Last BM:  01/13/19  Height:   Ht Readings from Last 1 Encounters:  01/15/19 _0  (1.626  m)    Weight:   Wt Readings from Last 1 Encounters:  01/16/19 68.4 kg    Ideal Body Weight:  54.5 kg  BMI:  Body mass index is 25.9 kg/m.  Estimated Nutritional Needs:   Kcal:  1850-2050  Protein:  90-105 grams  Fluid:  > 1.8 L    Molly Dillon A. Jimmye Norman, RD, LDN, [place] Registered Dietitian II Certified Diabetes Care and Education Specialist Pager: [phone] After hours Pager: [phone]

## 2019-01-16 NOTE — ED Provider Notes (Signed)
Molly Dillon   [id number] 01/14/19 Arrival Time: [id number]  ASSESSMENT & PLAN:  1. SOB (shortness of breath)     Evaluated in triage. SpO2 around 70 but meter reading much lower at times. Good waveform. Labored breathing/respiratory distress. At risk for acute respiratory failure. Spo2 90 on 6L O2 via [place]. Transported to ED via EMS; stable upon discharge.   Meds ordered this encounter  Medications  . methylPREDNISolone sodium succinate (SOLU-MEDROL) 125 mg/2 mL injection 80 mg    Reviewed expectations re: course of current medical issues. Questions answered. Outlined signs and symptoms indicating need for more acute intervention. Patient verbalized understanding. After Visit Summary given.   SUBJECTIVE:  Shortness of Breath   History from: patient. Molly Dillon is a 59 y.o. female with a h/o COPD who presents with complaint of persistent SOB without associated CP. Past 4-5 days; gradually worsening. Uses home O2; unknown L. Home nebs without relief. "Just can't catch my breath now." Occasional coughing and wheezing much more often than normally. Normal PO intake without n/v. Ambulatory without difficulty. No LE edema or calf pain/swelling. Denies: exertional chest pressure/discomfort, irregular heart beat, near-syncope, palpitations and syncope. Aggravating factors: have not been identified. Alleviating factors: have not been identified. Recent illnesses: none. Fever: absent. Illicit drug use: none.  Social History   Tobacco Use  Smoking Status Former Smoker  . Packs/day: 0.50  . Years: 36.00  . Pack years: 18.00  . Types: Cigarettes  . Start date: 01/13/1980  . Quit date: 01/03/2018  . Years since quitting: 1.0  Smokeless Tobacco Never Used  Tobacco Comment   down to 5 cigs a day   Social History   Substance and Sexual Activity  Alcohol Use No  . Alcohol/week: 0.0 standard drinks     ROS: As per HPI. All other systems negative.   OBJECTIVE:  Vitals:     01/14/19 1720 01/14/19 1731 01/14/19 1744  BP: (!) 150/78    Pulse: 87 99   Resp: (!) 32    Temp: 98.9 F (37.2 C)    TempSrc: Oral    SpO2: 90% 100% 98%    General appearance: alert, oriented, labored breathing in triage; speaks short sentences, just a few words at a time Eyes: PERRLA; EOMI; conjunctivae normal HENT: normocephalic; atraumatic Neck: supple with FROM Lungs: with labored respirations; bilateral wheezing and rales Heart: regular rate and rhythm Chest Wall: without tenderness to palpation Abdomen: soft, non-tender; bowel sounds normal; no masses or organomegaly; no guarding or rebound tenderness Extremities: without edema; without calf swelling or tenderness; symmetrical without gross deformities Skin: warm and dry; without rash or lesions Psychological: alert and cooperative; normal mood and affect  Labs: Results for orders placed or performed in visit on 09/29/18  Urine cytology ancillary only  Result Value Ref Range   Chlamydia Negative    Neisseria Gonorrhea Negative    Labs Reviewed - No data to display  Imaging: No results found.   Allergies  Allergen Reactions  . Dapsone Shortness Of Breath  . Penicillins Anaphylaxis    Has patient had a PCN reaction causing immediate rash, facial/tongue/throat swelling, SOB or lightheadedness with hypotension: Yes Has patient had a PCN reaction causing severe rash involving mucus membranes or skin necrosis: Yes Has patient had a PCN reaction that required hospitalization: No Has patient had a PCN reaction occurring within the last 10 years: No If all of the above answers are "NO", then may proceed with Cephalosporin use.     Past  Medical History:  Diagnosis Date  . Acute renal insufficiency 06/06/2014  . Asthma   . Constipation 07/19/2014  . COPD (chronic obstructive pulmonary disease) with acute bronchitis ([place]) 06/06/2014  . Depression   . Depression, major, recurrent, moderate ([place]) 04/02/2015  . Dyspnea   .  HIV infection ([place])   . Hyperlipidemia   . Hypertension   . Hypertensive nephropathy 07/19/2014  . Unintentional weight loss 02/13/2015   Social History   Socioeconomic History  . Marital status: Married    Spouse name: Not on file  . Number of children: Not on file  . Years of education: Not on file  . Highest education level: Not on file  Occupational History  . Not on file  Tobacco Use  . Smoking status: Former Smoker    Packs/day: 0.50    Years: 36.00    Pack years: 18.00    Types: Cigarettes    Start date: 01/13/1980    Quit date: 01/03/2018    Years since quitting: 1.0  . Smokeless tobacco: Never Used  . Tobacco comment: down to 5 cigs a day  Substance and Sexual Activity  . Alcohol use: No    Alcohol/week: 0.0 standard drinks  . Drug use: No  . Sexual activity: Not Currently    Partners: Male    Birth control/protection: None    Comment: given condoms  Other Topics Concern  . Not on file  Social History Narrative  . Not on file   Social Determinants of Health   Financial Resource Strain:   . Difficulty of Paying Living Expenses: Not on file  Food Insecurity:   . Worried About Charity fundraiser in the Last Year: Not on file  . Ran Out of Food in the Last Year: Not on file  Transportation Needs:   . Lack of Transportation (Medical): Not on file  . Lack of Transportation (Non-Medical): Not on file  Physical Activity:   . Days of Exercise per Week: Not on file  . Minutes of Exercise per Session: Not on file  Stress:   . Feeling of Stress : Not on file  Social Connections:   . Frequency of Communication with Friends and Family: Not on file  . Frequency of Social Gatherings with Friends and Family: Not on file  . Attends Religious Services: Not on file  . Active Member of Clubs or Organizations: Not on file  . Attends Archivist Meetings: Not on file  . Marital Status: Not on file  Intimate Partner Violence:   . Fear of Current or Ex-Partner: Not  on file  . Emotionally Abused: Not on file  . Physically Abused: Not on file  . Sexually Abused: Not on file   Family History  Problem Relation Age of Onset  . Diabetes Mother   . Healthy Father   . Diabetes Sister   . Diabetes Sister    History reviewed. No pertinent surgical history.   Vanessa Kick, MD 01/16/19 [phone]

## 2019-01-17 LAB — BASIC METABOLIC PANEL
Anion gap: 13 (ref 5–15)
BUN: 38 mg/dL — ABNORMAL HIGH (ref 6–20)
CO2: 31 mmol/L (ref 22–32)
Calcium: 9.9 mg/dL (ref 8.9–10.3)
Chloride: 99 mmol/L (ref 98–111)
Creatinine, Ser: 1.39 mg/dL — ABNORMAL HIGH (ref 0.44–1.00)
GFR calc Af Amer: 49 mL/min — ABNORMAL LOW (ref >60)
GFR calc non Af Amer: 42 mL/min — ABNORMAL LOW (ref >60)
Glucose, Bld: 77 mg/dL (ref 70–99)
Potassium: 5.3 mmol/L — ABNORMAL HIGH (ref 3.5–5.1)
Sodium: 143 mmol/L (ref 135–145)

## 2019-01-17 LAB — CBC
HCT: 49.3 % — ABNORMAL HIGH (ref 36.0–46.0)
Hemoglobin: 15.9 g/dL — ABNORMAL HIGH (ref 12.0–15.0)
MCH: 31.4 pg (ref 26.0–34.0)
MCHC: 32.3 g/dL (ref 30.0–36.0)
MCV: 97.2 fL (ref 80.0–100.0)
Platelets: 373 10*3/uL (ref 150–400)
RBC: 5.07 MIL/uL (ref 3.87–5.11)
RDW: 14.5 % (ref 11.5–15.5)
WBC: 12.7 10*3/uL — ABNORMAL HIGH (ref 4.0–10.5)
nRBC: 0.2 % (ref 0.0–0.2)

## 2019-01-17 LAB — POTASSIUM: Potassium: 4.1 mmol/L (ref 3.5–5.1)

## 2019-01-17 LAB — LEGIONELLA PNEUMOPHILA SEROGP 1 UR AG: L. pneumophila Serogp 1 Ur Ag: NEGATIVE

## 2019-01-17 MED ORDER — IPRATROPIUM-ALBUTEROL 0.5-2.5 (3) MG/3ML IN SOLN
3.0000 mL | Freq: Three times a day (TID) | RESPIRATORY_TRACT | Status: DC
  Administered 2019-01-17 – 2019-01-18 (×3): 3 mL via RESPIRATORY_TRACT
  Filled 2019-01-17 (×3): qty 3

## 2019-01-17 MED FILL — DULoxetine HCL 30 MG CPEP: 30 | 30 days supply | Qty: 60 | Fill #1

## 2019-01-17 MED FILL — PRAVASTATIN SODIUM 20 MG TA: 20 | 30 days supply | Qty: 30 | Fill #3

## 2019-01-17 MED FILL — BIKTARVY 50-200-25 MG TABS: 50-200-25 | 30 days supply | Qty: 30 | Fill #1

## 2019-01-17 MED FILL — AMLODIPINE BESYLATE 10 MG T: 10 | 30 days supply | Qty: 30 | Fill #3

## 2019-01-17 NOTE — Progress Notes (Signed)
  Mobility Specialist Criteria Algorithm Info.  SATURATION QUALIFICATIONS: (This note is used to comply with regulatory documentation for home oxygen)  Patient Saturations on Room Air at Rest = n/a%  Patient Saturations on Room Air while Ambulating = n/a%  Patient Saturations on 4 Liters of oxygen while Ambulating = 92%  Please briefly explain why patient needs home oxygen: On 2LO2 at rest, patient maintained an oxygen saturation of 92-88%. While ambulating patient desaturated quickly on 2 and 3 liters <87%. On 4 liters, patient maintains an oxygen saturation >90% while ambulating.   Mobility:  Activity: Ambulated in hall;Dangled on edge of bed Range of motion: Active;All extremities Level of assistance: Modified independent, requires aide device or extra time Assistive device: None;Other (Comment) (Wall Rails and Dinamap) Minutes stood: 5 minutes Minutes ambulated: 5 minutes Distance ambulated (ft): 200 ft Mobility response: Tolerated well;RN notified Bed Position: Semi-fowlers Mobility Status: Yes, no lift needed   01/17/2019 3:14 PM'

## 2019-01-17 NOTE — Progress Notes (Signed)
Family Medicine Teaching Service Daily Progress Note Intern Pager: [phone]  Patient name: Molly Dillon Medical record number: [id number] Date of birth: 18-Mar-1960 Age: 59 y.o. Gender: female  Primary Care Provider: Gerlene Fee, DO Consultants: none Code Status: full  Pt Overview and Major Events to Date:  1/2 - admitted for COPD exacerbation  Assessment and Plan: Molly Dillon is a 59 y.o. female presenting with shortness of breath and increased O2 requirement. PMH is significant for HIV, hyperlipidemia, hypertension, tobacco abuse, COPD with asthma, MDD, CKD stage III, chronic pain.  Acute on chronic hypoxic, hypercarbic respiratory failure Likely secondary to COPD exacerbation versus community-acquired pneumonia.  Covid and flu were both negative. CXR on admission with stable emphysema. Did have initial concern of possible PE given resp distress and tachycardia however these improved with steroids and breathing treatments.  S/p solumedrol, continues on prednisone. Concern for increased risk of mortality given HIV+. S. pneumo negative. Continues on levaquin.  Patient currently on 8 L high flow nasal cannula, sat 95%.  We will plan to try to wean oxygen as tolerated. Current A1C of 6.3. -Continuous pulse ox  -Oxygen therapy, goal of 88-94% -Continue Levaquin (1/3- ) -Follow-up on sputum Gram stain reflex culture -Status post Solu-Medrol x1 at urgent care.  Continue prednisone 40 mg for 5 days -DuoNeb scheduled every 6 hours. -Initiate Incruse as we decrease the duo nebs. -Albuterol neb every 2 hours as needed -Patient using Dulera as Advair is nonformulary -Expectorant gram stain -Can consider ID consult for antibiotic coverage if worsening symptoms -Recommend flu vaccine if not already received. -Incentive spirometry  COPD with asthma Albuterol andAdvair Diskus daily, current smoker. GOLD stage D.  Advair not on formulary, will sub Dulera while in patient.    Exacerbation meds as above.  HIV, stable Current CD4 count 337.  Diagnosed in 1993. Last CD4 count was in 08/2018 at 393, last viral load at same time was <20 not detectable. -Continue home Biktarvy  Hypertension, chronic Most recent BP 145/82. - Continue home amlodipine 10 mg -Continue to monitor  Hyperlipidemi/a, chronic Last lipid profile 08/2018 Tchol 204, HDL 46, LDL 124, TG 22. ASCVD risk is 11.6%.  Home medication include pravastatin 20 mg. -Continue home pravastatin.  Tobacco abuse Patient continues to smoke cigarettes though has decreased the amount per day, she has not smoked since before Christmas due to not feeling well.  Nicotine patch ordered  Patient would benefit from further counseling as smoking in the setting of HIV doubles risk of pulmonary infections   CKD, stable Most recent creatinine 1.39.  Estimated GFR 49. - Avoid nephrotoxic agents   Chronic pain, chronic At home, Cymbalta60 mg, Lyrica 75 mg, Voltaren gel. No complaints today.   Continue cymbalta and lyrica  FEN/GI: regular diet Prophylaxis: Lovenox  Disposition: continue inpt management  Subjective:  Patient continues on 8 L flow nasal cannula.  Very slowly progressing, still with significant wheezing on exam  Objective: Temp:  [97.6 F (36.4 C)-98.6 F (37 C)] 98.2 F (36.8 C) (01/05 0508) Pulse Rate:  [76-96] 79 (01/05 0508) Resp:  [16-20] 20 (01/05 0508) BP: (125-145)/(73-88) 145/82 (01/05 0508) SpO2:  [89 %-97 %] 95 % (01/05 0508) Weight:  [68.1 kg] 68.1 kg (01/05 0508) Physical Exam: General: Alert and oriented in no apparent distress Heart: S1, S2 with no murmurs appreciated Lungs: Wheezing present, breath sounds faint similar to yesterday Abdomen: Bowel sounds present, no abdominal pain Extremities: No lower extremity edema   Laboratory: Recent Labs  Lab 01/14/19  1841 01/15/19 0106 01/15/19 0445  WBC 11.4*  --  11.9*  HGB 16.6* 17.3* 15.9*  HCT 51.4*  51.0* 49.3*  PLT 336  --  330   Recent Labs  Lab 01/14/19 1841 01/15/19 0106 01/15/19 0445  NA 136 136 137  K 4.7 4.8 4.3  CL 99  --  94*  CO2 27  --  30  BUN 20  --  25*  CREATININE 1.28*  --  1.26*  CALCIUM 9.2  --  9.4  GLUCOSE 137*  --  178*    Imaging/Diagnostic Tests: No results found. Lurline Del, DO 01/17/2019, 5:57 AM PGY-1, [place] Intern pager: [phone], text pages welcome

## 2019-01-17 NOTE — Plan of Care (Signed)

## 2019-01-17 NOTE — Progress Notes (Signed)
Pts order for BIPAP is PRN. Pt not in need at this time. Pt respiratory status is stable on HFNC [place] 4 Lpm. RT will continue to monitor.

## 2019-01-17 NOTE — Progress Notes (Signed)
Flutter valve provided with instructions.  Patient used device with good technique.

## 2019-01-17 NOTE — Progress Notes (Addendum)
Social Note Made a social visit to Molly Dillon today as I was made aware that she was admitted to [facility]. She said she is feeling well and glad she was able to seek care when she did.   This was my first time meeting Molly Dillon as her PCP. I was contacted by Molly Dillon 12/30 via [employer]. I advised her to go the ED or urgent care as the office was closed for the upcoming holiday. She was experiencing respiratory symptoms.   I greatly appreciate Dr. Vanessa [place] and the FPTS for their service.   Gerlene Fee, [place] [facility] PGY-1

## 2019-01-18 ENCOUNTER — Other Ambulatory Visit: Payer: Self-pay | Admitting: *Deleted

## 2019-01-18 MED ORDER — LEVOFLOXACIN 500 MG PO TABS: 500.0000 mg | ORAL_TABLET | Freq: Every day | ORAL | 0 refills | Status: DC

## 2019-01-18 MED ORDER — PREDNISONE 20 MG PO TABS: 40.0000 mg | ORAL_TABLET | Freq: Every day | ORAL | 0 refills | Status: DC

## 2019-01-18 MED ORDER — MOMETASONE FURO-FORMOTEROL FUM 200-5 MCG/ACT IN AERO: 2.0000 | INHALATION_SPRAY | Freq: Two times a day (BID) | RESPIRATORY_TRACT | 0 refills | Status: DC

## 2019-01-18 MED ORDER — UMECLIDINIUM BROMIDE 62.5 MCG/INH IN AEPB: 1.0000 | INHALATION_SPRAY | Freq: Every day | RESPIRATORY_TRACT | 0 refills | Status: DC

## 2019-01-18 NOTE — Care Management Important Message (Signed)
Important Message  Patient Details  Name: Molly Dillon MRN: [id number] Date of Birth: 23-Jul-1960   Medicare Important Message Given:  Yes     Shelda Altes 01/18/2019, 12:31 PM

## 2019-01-18 NOTE — Discharge Instructions (Signed)
You were admitted for an exacerbation of your COPD.  Fortunately after some steroids, antibiotics, and breathing treatments your breathing has much improved and you are back to your baseline oxygen requirements.  We recommend that you continue to use your albuterol at home as needed every 4-6 hours for the next 1-2 days and reduce as you can from there.  We have also added a new daily inhaler to your regimen, this will hopefully help keep your breathing under better control.  Please make sure you follow-up in our clinic on 1/8 for a hospital follow-up.  If you have any worsening of your breathing please make sure you follow-up sooner if needed.  Chronic Obstructive Pulmonary Disease Chronic obstructive pulmonary disease (COPD) is a long-term (chronic) lung problem. When you have COPD, it is hard for air to get in and out of your lungs. Usually the condition gets worse over time, and your lungs will never return to normal. There are things you can do to keep yourself as healthy as possible.  Your doctor may treat your condition with: ? Medicines. ? Oxygen. ? Lung surgery.  Your doctor may also recommend: ? Rehabilitation. This includes steps to make your body work better. It may involve a team of specialists. ? Quitting smoking, if you smoke. ? Exercise and changes to your diet. ? Comfort measures (palliative care). Follow these instructions at home: Medicines  Take over-the-counter and prescription medicines only as told by your doctor.  Talk to your doctor before taking any cough or allergy medicines. You may need to avoid medicines that cause your lungs to be dry. Lifestyle  If you smoke, stop. Smoking makes the problem worse. If you need help quitting, ask your doctor.  Avoid being around things that make your breathing worse. This may include smoke, chemicals, and fumes.  Stay active, but remember to rest as well.  Learn and use tips on how to relax.  Make sure you get enough  sleep. Most adults need at least 7 hours of sleep every night.  Eat healthy foods. Eat smaller meals more often. Rest before meals. Controlled breathing Learn and use tips on how to control your breathing as told by your doctor. Try:  Breathing in (inhaling) through your nose for 1 second. Then, pucker your lips and breath out (exhale) through your lips for 2 seconds.  Putting one hand on your belly (abdomen). Breathe in slowly through your nose for 1 second. Your hand on your belly should move out. Pucker your lips and breathe out slowly through your lips. Your hand on your belly should move in as you breathe out.  Controlled coughing Learn and use controlled coughing to clear mucus from your lungs. Follow these steps: 1. Lean your head a little forward. 2. Breathe in deeply. 3. Try to hold your breath for 3 seconds. 4. Keep your mouth slightly open while coughing 2 times. 5. Spit any mucus out into a tissue. 6. Rest and do the steps again 1 or 2 times as needed. General instructions  Make sure you get all the shots (vaccines) that your doctor recommends. Ask your doctor about a flu shot and a pneumonia shot.  Use oxygen therapy and pulmonary rehabilitation if told by your doctor. If you need home oxygen therapy, ask your doctor if you should buy a tool to measure your oxygen level (oximeter).  Make a COPD action plan with your doctor. This helps you to know what to do if you feel worse than usual.  Manage any other conditions you have as told by your doctor.  Avoid going outside when it is very hot, cold, or humid.  Avoid people who have a sickness you can catch (contagious).  Keep all follow-up visits as told by your doctor. This is important. Contact a doctor if:  You cough up more mucus than usual.  There is a change in the color or thickness of the mucus.  It is harder to breathe than usual.  Your breathing is faster than usual.  You have trouble sleeping.  You need  to use your medicines more often than usual.  You have trouble doing your normal activities such as getting dressed or walking around the house. Get help right away if:  You have shortness of breath while resting.  You have shortness of breath that stops you from: ? Being able to talk. ? Doing normal activities.  Your chest hurts for longer than 5 minutes.  Your skin color is more blue than usual.  Your pulse oximeter shows that you have low oxygen for longer than 5 minutes.  You have a fever.  You feel too tired to breathe normally. Summary  Chronic obstructive pulmonary disease (COPD) is a long-term lung problem.  The way your lungs work will never return to normal. Usually the condition gets worse over time. There are things you can do to keep yourself as healthy as possible.  Take over-the-counter and prescription medicines only as told by your doctor.  If you smoke, stop. Smoking makes the problem worse. This information is not intended to replace advice given to you by your health care provider. Make sure you discuss any questions you have with your health care provider. Document Revised: 12/11/2016 Document Reviewed: 02/03/2016 Elsevier Patient Education  2020 [employer].

## 2019-01-18 NOTE — TOC Transition Note (Signed)
Transition of Care [facility]) - CM/SW Discharge Note   Patient Details  Name: Molly Dillon MRN: [id number] Date of Birth: Jun 06, 1960  Transition of Care [facility]) CM/SW Contact:  Zenon Mayo, RN Phone Number: 01/18/2019, 12:17 PM   Clinical Narrative:    Patient for dc home, she has home oxygen with [employer], she has a big tank in the room , husband does not know how to operate it and not sure if it has oxygen  In it.  NCM contacted [place] with [employer], he will be right up to help with this and give her an extra tank if needed.    Final next level of care: Home/Self Care Barriers to Discharge: No Barriers Identified   Patient Goals and CMS Choice Patient states their goals for this hospitalization and ongoing recovery are:: go home      Discharge Placement                       Discharge Plan and Services                DME Arranged: (NA)                    Social Determinants of Health (SDOH) Interventions     Readmission Risk Interventions No flowsheet data found.

## 2019-01-19 MED ORDER — MOMETASONE FURO-FORMOTEROL FUM 200-5 MCG/ACT IN AERO: 2.0000 | INHALATION_SPRAY | Freq: Two times a day (BID) | RESPIRATORY_TRACT | 0 refills | Status: DC

## 2019-01-20 ENCOUNTER — Other Ambulatory Visit: Payer: Self-pay

## 2019-01-20 ENCOUNTER — Telehealth (INDEPENDENT_AMBULATORY_CARE_PROVIDER_SITE_OTHER): Payer: Medicare HMO | Admitting: Family Medicine

## 2019-01-20 DIAGNOSIS — R630 Anorexia: Secondary | ICD-10-CM

## 2019-01-20 DIAGNOSIS — J449 Chronic obstructive pulmonary disease, unspecified: Secondary | ICD-10-CM

## 2019-01-20 NOTE — Progress Notes (Signed)
[place] Telemedicine Visit  Patient consented to have virtual visit. Method of visit: Video was attempted, but technology challenges prevented patient from using video, so visit was conducted via telephone.  Encounter participants: Patient: Molly Dillon - located at home Provider: Benay Pike - located at [facility] Others (if applicable): None  Chief Complaint: Hospital follow-up for acute hypoxic respiratory failure secondary to COPD exacerbation  HPI:  COPD exacerbation: Patient says she is still having "slight" trouble breathing.  States it is much better than when she was at the hospital.  She is currently on 4 L which is what she was using prior to hospitalization.  Patient has been using her Dulera and albuterol.  Uses the [facility] as needed multiple times a day and uses the albuterol twice a day.  She has not gotten her Incruse Ellipta yet due to insurance reasons.  Abnormal facial sensation: Patient states her face especially around her mouth feels "sticky".  This is been going on for "a long time" which is at least several months possibly over a year.  She has not noticed any rash on her face.  It does not itch..  Poor appetite: Patient states she is not eating because she "does not have an appetite".  She gets a craving for foods, then goes and gets them, but when it is time to eat she no longer wants to eat it.  No loss of sensation of taste or smell.  Does not get nauseous or have vomiting with food.  She drinks sweet tea and water and has no issues with consuming liquids.  ROS: per HPI  Pertinent PMHx: COPD, HIV  Exam:  Respiratory: Patient speaking full sentences during conversation without difficulty.  No coughing.  Assessment/Plan:  COPD with asthma [facility]) Patient feels better than during her hospitalization.  Has not worsened since discharge.  Unable to get her Incruse Ellipta, but is using Dulera and albuterol, although she is using them in  reverse.  I advised her that the albuterol is a rescue inhaler to be used as needed and the [facility] should only be used twice a day.  Poor appetite Chronic issue with patient.  Has food cravings, but a disappear when it is time for her to eat food.  No issues with swallowing, dysphagia, nausea vomiting, decreased sense of taste or smell.  Does not apply to liquids, only solid foods.  Patient's weight is currently 149 pounds.  Was 172 pounds in February.  Advised patient to use liquid nutritional supplements at least 3 times a day so that it totals up to 1100 cal or more.  Made appointment for patient with her PCP in early February.    Time spent during visit with patient: 16 minutes

## 2019-01-21 DIAGNOSIS — R630 Anorexia: Secondary | ICD-10-CM | POA: Insufficient documentation

## 2019-01-21 NOTE — Assessment & Plan Note (Signed)
Chronic issue with patient.  Has food cravings, but a disappear when it is time for her to eat food.  No issues with swallowing, dysphagia, nausea vomiting, decreased sense of taste or smell.  Does not apply to liquids, only solid foods.  Patient's weight is currently 149 pounds.  Was 172 pounds in February.  Advised patient to use liquid nutritional supplements at least 3 times a day so that it totals up to 1100 cal or more.  Made appointment for patient with her PCP in early February.

## 2019-01-21 NOTE — Assessment & Plan Note (Signed)
Patient feels better than during her hospitalization.  Has not worsened since discharge.  Unable to get her Incruse Ellipta, but is using Dulera and albuterol, although she is using them in reverse.  I advised her that the albuterol is a rescue inhaler to be used as needed and the [facility] should only be used twice a day.

## 2019-01-23 ENCOUNTER — Ambulatory Visit: Payer: Medicare HMO | Admitting: Family Medicine

## 2019-01-24 ENCOUNTER — Telehealth: Payer: Self-pay | Admitting: *Deleted

## 2019-01-24 DIAGNOSIS — J449 Chronic obstructive pulmonary disease, unspecified: Secondary | ICD-10-CM

## 2019-01-24 MED ORDER — FLUTICASONE FUROATE-VILANTEROL 100-25 MCG/INH IN AEPB: 1.0000 | INHALATION_SPRAY | Freq: Every day | RESPIRATORY_TRACT | Status: AC

## 2019-01-24 NOTE — Telephone Encounter (Signed)
Looks as if Ruthe Mannan is not covered and will require a PA.  The below meds will not, please let RN team know if you would like to pursue a PA.

## 2019-02-05 DIAGNOSIS — J9601 Acute respiratory failure with hypoxia: Secondary | ICD-10-CM | POA: Diagnosis not present

## 2019-02-05 DIAGNOSIS — J441 Chronic obstructive pulmonary disease with (acute) exacerbation: Secondary | ICD-10-CM | POA: Diagnosis not present

## 2019-02-05 DIAGNOSIS — J449 Chronic obstructive pulmonary disease, unspecified: Secondary | ICD-10-CM | POA: Diagnosis not present

## 2019-02-09 ENCOUNTER — Encounter: Payer: Self-pay | Admitting: Family Medicine

## 2019-02-13 ENCOUNTER — Encounter: Payer: Self-pay | Admitting: Family Medicine

## 2019-02-13 DIAGNOSIS — R32 Unspecified urinary incontinence: Secondary | ICD-10-CM | POA: Diagnosis not present

## 2019-02-17 ENCOUNTER — Ambulatory Visit (INDEPENDENT_AMBULATORY_CARE_PROVIDER_SITE_OTHER): Payer: Medicare HMO | Admitting: Family Medicine

## 2019-02-17 ENCOUNTER — Other Ambulatory Visit: Payer: Self-pay

## 2019-02-17 VITALS — BP 110/75 | HR 103 | Wt 161.2 lb

## 2019-02-17 DIAGNOSIS — F17200 Nicotine dependence, unspecified, uncomplicated: Secondary | ICD-10-CM

## 2019-02-17 DIAGNOSIS — Z0000 Encounter for general adult medical examination without abnormal findings: Secondary | ICD-10-CM | POA: Diagnosis not present

## 2019-02-17 DIAGNOSIS — J449 Chronic obstructive pulmonary disease, unspecified: Secondary | ICD-10-CM

## 2019-02-17 DIAGNOSIS — J4489 Other specified chronic obstructive pulmonary disease: Secondary | ICD-10-CM

## 2019-02-17 MED FILL — NICOTINE 7 MG/24HR PATCH: 7 | 14 days supply | Qty: 14 | Fill #0

## 2019-02-17 MED FILL — SM NICOTINE 2 MG CHEWING GU: 2 | 14 days supply | Qty: 110 | Fill #0

## 2019-02-17 NOTE — Assessment & Plan Note (Signed)
Stable. Counseled on using dulera twice daily regardless of respiratory symptoms. She has only used her albuterol inhaler sparingly. -Continue dulera BID -Continue albuterol PRN -Follow up as needed

## 2019-02-17 NOTE — Progress Notes (Signed)
   CHIEF COMPLAINT / HPI: Molly Dillon is a pleasant 59 y.o. female presenting today for a follow up visit.  COPD Is doing well. No complaints at this time. Continues to use dulera twice daily. Used albuterol x1. Could not pick up Incrue Ellipta due to cost, insurance did not cover. No headache, chest pain, or chest tightness.   Tobacco use disorder  Stopped since last hospitalization since last January. Used to smoke 1/2 pack (10 cigarettes) a day. Would like help with smoking cessation. Prefers to try the patch. Has urges all the time. Eats to make it go away. Fruit, peanut butter crackers, and potatoes. Urges emerge at meal times and night time.   PERTINENT  PMH / PSH: COPD, Tobacco use disorder, HIV+   OBJECTIVE: BP 110/75   Pulse (!) 103   Wt 161 lb 3.2 oz (73.1 kg)   LMP 01/12/2011   SpO2 91%   BMI 27.67 kg/m    General: Appears well, no acute distress. Age appropriate. Cardiac: RRR, normal heart sounds, no murmurs Respiratory: CTAB, normal effort. No wheezing, rhonchi, or rales Neuro: alert and oriented, no focal deficits Psych: normal affect  ASSESSMENT / PLAN:  COPD with asthma ([place]) Stable. Counseled on using dulera twice daily regardless of respiratory symptoms. She has only used her albuterol inhaler sparingly. -Continue dulera BID -Continue albuterol PRN -Follow up as needed  Tobacco use disorder Smoking cessation reported to be in January. Continues to have urges. Filling the urges with eating. Concern for poor appetite and weight loss in month prior. She has gained 12lbs in 1 month. Likely due to a combination of smoking cessation and eating to combat urges. AVS on help with urges: Try sugar free gum or candy. Brush your teeth after eating to reduce cravings. -Start 2 mg nicotine gum (counseled on how to chew properly) and 7 mg nicotine patch, sent to [facility] -Follow up as needed    Health care maintenance Referral sent for [place]

## 2019-02-17 NOTE — Patient Instructions (Signed)
It was very nice to meet you today. Please enjoy the rest of your week.   Please call the clinic at [phone] if your symptoms worsen or you have any concerns. It was our pleasure to serve you.   When you have cravings--- - Try sugar free gum or candy  - We sent nicotine gum to our [place] your teeth after eating to reduce cravings

## 2019-02-17 NOTE — Assessment & Plan Note (Addendum)
Smoking cessation reported to be in January. Continues to have urges. Filling the urges with eating. Concern for poor appetite and weight loss in month prior. She has gained 12lbs in 1 month. Likely due to a combination of smoking cessation and eating to combat urges. AVS on help with urges: Try sugar free gum or candy. Brush your teeth after eating to reduce cravings. -Start 2 mg nicotine gum (counseled on how to chew properly) and 7 mg nicotine patch, sent to [place] -Follow up as needed

## 2019-02-18 ENCOUNTER — Encounter: Payer: Self-pay | Admitting: Family Medicine

## 2019-02-18 NOTE — Assessment & Plan Note (Signed)
Referral sent for Mammogram

## 2019-02-21 MED FILL — BIKTARVY 50-200-25 MG TABS: 50-200-25 | 30 days supply | Qty: 30 | Fill #2

## 2019-02-21 MED FILL — AMLODIPINE BESYLATE 10 MG T: 10 | 30 days supply | Qty: 30 | Fill #4

## 2019-02-21 MED FILL — DULoxetine HCL 30 MG CPEP: 30 | 30 days supply | Qty: 60 | Fill #2

## 2019-02-21 MED FILL — PRAVASTATIN SODIUM 20 MG TA: 20 | 30 days supply | Qty: 30 | Fill #4

## 2019-02-24 DIAGNOSIS — M545 Low back pain: Secondary | ICD-10-CM | POA: Diagnosis not present

## 2019-02-24 DIAGNOSIS — G894 Chronic pain syndrome: Secondary | ICD-10-CM | POA: Diagnosis not present

## 2019-02-24 DIAGNOSIS — M25569 Pain in unspecified knee: Secondary | ICD-10-CM | POA: Diagnosis not present

## 2019-02-24 DIAGNOSIS — Z79891 Long term (current) use of opiate analgesic: Secondary | ICD-10-CM | POA: Diagnosis not present

## 2019-03-08 DIAGNOSIS — J449 Chronic obstructive pulmonary disease, unspecified: Secondary | ICD-10-CM | POA: Diagnosis not present

## 2019-03-08 DIAGNOSIS — J441 Chronic obstructive pulmonary disease with (acute) exacerbation: Secondary | ICD-10-CM | POA: Diagnosis not present

## 2019-03-08 DIAGNOSIS — J9601 Acute respiratory failure with hypoxia: Secondary | ICD-10-CM | POA: Diagnosis not present

## 2019-03-10 ENCOUNTER — Telehealth: Payer: Self-pay | Admitting: Family Medicine

## 2019-03-10 NOTE — Telephone Encounter (Signed)
[facility] called checking status of incontinence supplies form. Form faxed to [place] on 02/19/20201 and is in PCP's box. Please complete and fax back as soon as possible. Please advise.

## 2019-03-14 DIAGNOSIS — R32 Unspecified urinary incontinence: Secondary | ICD-10-CM | POA: Diagnosis not present

## 2019-03-27 ENCOUNTER — Other Ambulatory Visit: Payer: Self-pay

## 2019-03-27 MED FILL — BIKTARVY 50-200-25 MG TABS: 50-200-25 | 30 days supply | Qty: 30 | Fill #3

## 2019-03-27 MED FILL — AMLODIPINE BESYLATE 10 MG T: 10 | 30 days supply | Qty: 30 | Fill #5

## 2019-03-27 MED FILL — PRAVASTATIN SODIUM 20 MG TA: 20 | 30 days supply | Qty: 30 | Fill #5

## 2019-03-29 ENCOUNTER — Ambulatory Visit: Payer: Medicare HMO

## 2019-03-30 ENCOUNTER — Ambulatory Visit: Payer: Medicare HMO

## 2019-04-05 ENCOUNTER — Other Ambulatory Visit: Payer: Self-pay | Admitting: *Deleted

## 2019-04-05 MED ORDER — ALBUTEROL SULFATE HFA 108 (90 BASE) MCG/ACT IN AERS: 2.0000 | INHALATION_SPRAY | Freq: Four times a day (QID) | RESPIRATORY_TRACT | 11 refills | Status: DC | PRN

## 2019-04-07 ENCOUNTER — Other Ambulatory Visit: Payer: Self-pay | Admitting: Family Medicine

## 2019-04-07 ENCOUNTER — Encounter: Payer: Self-pay | Admitting: Family Medicine

## 2019-04-07 DIAGNOSIS — J449 Chronic obstructive pulmonary disease, unspecified: Secondary | ICD-10-CM

## 2019-04-07 DIAGNOSIS — J4489 Other specified chronic obstructive pulmonary disease: Secondary | ICD-10-CM

## 2019-04-07 MED ORDER — MOMETASONE FURO-FORMOTEROL FUM 200-5 MCG/ACT IN AERO: 2.0000 | INHALATION_SPRAY | Freq: Two times a day (BID) | RESPIRATORY_TRACT | 0 refills | Status: DC

## 2019-04-13 DIAGNOSIS — J441 Chronic obstructive pulmonary disease with (acute) exacerbation: Secondary | ICD-10-CM | POA: Diagnosis not present

## 2019-04-13 DIAGNOSIS — J9601 Acute respiratory failure with hypoxia: Secondary | ICD-10-CM | POA: Diagnosis not present

## 2019-04-13 DIAGNOSIS — J449 Chronic obstructive pulmonary disease, unspecified: Secondary | ICD-10-CM | POA: Diagnosis not present

## 2019-04-13 DIAGNOSIS — R32 Unspecified urinary incontinence: Secondary | ICD-10-CM | POA: Diagnosis not present

## 2019-04-18 ENCOUNTER — Other Ambulatory Visit: Payer: Self-pay | Admitting: Family Medicine

## 2019-04-18 DIAGNOSIS — M545 Low back pain, unspecified: Secondary | ICD-10-CM

## 2019-04-19 ENCOUNTER — Other Ambulatory Visit: Payer: Self-pay

## 2019-04-19 ENCOUNTER — Encounter: Payer: Self-pay | Admitting: Family Medicine

## 2019-04-19 DIAGNOSIS — J449 Chronic obstructive pulmonary disease, unspecified: Secondary | ICD-10-CM

## 2019-04-19 DIAGNOSIS — J4489 Other specified chronic obstructive pulmonary disease: Secondary | ICD-10-CM

## 2019-04-19 MED ORDER — MOMETASONE FURO-FORMOTEROL FUM 200-5 MCG/ACT IN AERO: 2.0000 | INHALATION_SPRAY | Freq: Two times a day (BID) | RESPIRATORY_TRACT | 0 refills | Status: DC

## 2019-04-19 MED ORDER — ALBUTEROL SULFATE HFA 108 (90 BASE) MCG/ACT IN AERS: 2.0000 | INHALATION_SPRAY | Freq: Four times a day (QID) | RESPIRATORY_TRACT | 11 refills | Status: AC | PRN

## 2019-04-19 MED FILL — DULoxetine HCL 30 MG CPEP: 30 | 30 days supply | Qty: 60 | Fill #0

## 2019-04-20 ENCOUNTER — Telehealth: Payer: Self-pay

## 2019-04-21 DIAGNOSIS — Z79891 Long term (current) use of opiate analgesic: Secondary | ICD-10-CM | POA: Diagnosis not present

## 2019-04-21 DIAGNOSIS — M25569 Pain in unspecified knee: Secondary | ICD-10-CM | POA: Diagnosis not present

## 2019-04-21 DIAGNOSIS — G894 Chronic pain syndrome: Secondary | ICD-10-CM | POA: Diagnosis not present

## 2019-04-21 DIAGNOSIS — M545 Low back pain: Secondary | ICD-10-CM | POA: Diagnosis not present

## 2019-04-23 NOTE — Telephone Encounter (Signed)
Needs prior authorization. I will follow up on this tomorrow morning. Thank you.

## 2019-04-24 ENCOUNTER — Telehealth: Payer: Self-pay | Admitting: *Deleted

## 2019-04-24 NOTE — Telephone Encounter (Signed)
Received fax from pharmacy, PA needed on dulera 200-5.  Clinical questions submitted via Cover My Meds.  Waiting on response, could take up to 72 hours.  Cover My Meds info: Key: [id number]  Christen Bame, CMA

## 2019-04-24 NOTE — Telephone Encounter (Signed)
Rx request for dulera 200-63mcg oral inhaler 120inh, inhale 2 puffs into the lungs twice daily. Please advise. Placido Hangartner Kennon Holter, CMA

## 2019-04-24 NOTE — Telephone Encounter (Signed)
     Pharmacy informed. Cobi Delph, CMA  

## 2019-04-24 NOTE — Telephone Encounter (Signed)
Wonderful, thank you

## 2019-04-26 MED FILL — PRAVASTATIN SODIUM 20 MG TA: 20 | 30 days supply | Qty: 30 | Fill #6

## 2019-04-26 MED FILL — AMLODIPINE BESYLATE 10 MG T: 10 | 30 days supply | Qty: 30 | Fill #6

## 2019-04-26 MED FILL — BIKTARVY 50-200-25 MG TABS: 50-200-25 | 30 days supply | Qty: 30 | Fill #4

## 2019-05-05 ENCOUNTER — Other Ambulatory Visit: Payer: Self-pay | Admitting: Family Medicine

## 2019-05-05 DIAGNOSIS — Z1231 Encounter for screening mammogram for malignant neoplasm of breast: Secondary | ICD-10-CM

## 2019-05-05 DIAGNOSIS — Z0000 Encounter for general adult medical examination without abnormal findings: Secondary | ICD-10-CM

## 2019-05-11 ENCOUNTER — Encounter: Payer: Self-pay | Admitting: Family Medicine

## 2019-05-11 ENCOUNTER — Ambulatory Visit: Payer: Medicare HMO

## 2019-05-14 NOTE — Telephone Encounter (Signed)
The patient has been asked to call the office and make an appointment to be seen for her concerns.

## 2019-05-22 ENCOUNTER — Other Ambulatory Visit: Payer: Self-pay | Admitting: Infectious Disease

## 2019-05-22 DIAGNOSIS — E7849 Other hyperlipidemia: Secondary | ICD-10-CM

## 2019-05-22 DIAGNOSIS — I10 Essential (primary) hypertension: Secondary | ICD-10-CM

## 2019-05-24 DIAGNOSIS — M25569 Pain in unspecified knee: Secondary | ICD-10-CM | POA: Diagnosis not present

## 2019-05-24 DIAGNOSIS — M545 Low back pain: Secondary | ICD-10-CM | POA: Diagnosis not present

## 2019-05-24 DIAGNOSIS — Z79891 Long term (current) use of opiate analgesic: Secondary | ICD-10-CM | POA: Diagnosis not present

## 2019-05-24 DIAGNOSIS — G894 Chronic pain syndrome: Secondary | ICD-10-CM | POA: Diagnosis not present

## 2019-05-25 ENCOUNTER — Other Ambulatory Visit: Payer: Self-pay | Admitting: Family Medicine

## 2019-05-25 DIAGNOSIS — J449 Chronic obstructive pulmonary disease, unspecified: Secondary | ICD-10-CM

## 2019-05-29 MED FILL — BIKTARVY 50-200-25 MG TABS: 50-200-25 | 30 days supply | Qty: 30 | Fill #5

## 2019-05-29 MED FILL — DULoxetine HCL 30 MG CPEP: 30 | 30 days supply | Qty: 60 | Fill #1

## 2019-05-30 ENCOUNTER — Ambulatory Visit (INDEPENDENT_AMBULATORY_CARE_PROVIDER_SITE_OTHER): Payer: Medicare HMO | Admitting: Family Medicine

## 2019-05-30 ENCOUNTER — Other Ambulatory Visit: Payer: Self-pay

## 2019-05-30 ENCOUNTER — Encounter: Payer: Self-pay | Admitting: Family Medicine

## 2019-05-30 VITALS — BP 114/62 | HR 64 | Ht 64.0 in | Wt 161.0 lb

## 2019-05-30 DIAGNOSIS — R1310 Dysphagia, unspecified: Secondary | ICD-10-CM

## 2019-05-30 DIAGNOSIS — R634 Abnormal weight loss: Secondary | ICD-10-CM

## 2019-05-30 DIAGNOSIS — J449 Chronic obstructive pulmonary disease, unspecified: Secondary | ICD-10-CM | POA: Diagnosis not present

## 2019-05-30 DIAGNOSIS — G894 Chronic pain syndrome: Secondary | ICD-10-CM

## 2019-05-30 DIAGNOSIS — IMO0001 Reserved for inherently not codable concepts without codable children: Secondary | ICD-10-CM

## 2019-05-30 DIAGNOSIS — R5383 Other fatigue: Secondary | ICD-10-CM

## 2019-05-30 DIAGNOSIS — R638 Other symptoms and signs concerning food and fluid intake: Secondary | ICD-10-CM | POA: Diagnosis not present

## 2019-05-30 DIAGNOSIS — J4489 Other specified chronic obstructive pulmonary disease: Secondary | ICD-10-CM

## 2019-05-30 DIAGNOSIS — R748 Abnormal levels of other serum enzymes: Secondary | ICD-10-CM

## 2019-05-30 LAB — POCT GLYCOSYLATED HEMOGLOBIN (HGB A1C): HbA1c, POC (controlled diabetic range): 5.6 % (ref 0.0–7.0)

## 2019-05-30 NOTE — Patient Instructions (Signed)
It was very nice to see you today. Please enjoy the rest of your week. Today you were seen for weight loss and painful swallowing.   We referred you to GI they will give you a call.  We have also referred you to [facility] for pain management.  Follow up as needed. Keep up the good with not smoking!  Please call the clinic at [phone] if your symptoms worsen or you have any concerns. It was our pleasure to serve you.

## 2019-05-31 ENCOUNTER — Encounter: Payer: Self-pay | Admitting: Gastroenterology

## 2019-05-31 ENCOUNTER — Encounter: Payer: Self-pay | Admitting: Family Medicine

## 2019-05-31 LAB — CBC WITH DIFFERENTIAL/PLATELET
Basophils Absolute: 0.1 10*3/uL (ref 0.0–0.2)
Basos: 1 %
EOS (ABSOLUTE): 0.1 10*3/uL (ref 0.0–0.4)
Eos: 2 %
Hematocrit: 43.9 % (ref 34.0–46.6)
Hemoglobin: 15 g/dL (ref 11.1–15.9)
Immature Grans (Abs): 0.1 10*3/uL (ref 0.0–0.1)
Immature Granulocytes: 1 %
Lymphocytes Absolute: 2.5 10*3/uL (ref 0.7–3.1)
Lymphs: 39 %
MCH: 31.2 pg (ref 26.6–33.0)
MCHC: 34.2 g/dL (ref 31.5–35.7)
MCV: 91 fL (ref 79–97)
Monocytes Absolute: 0.4 10*3/uL (ref 0.1–0.9)
Monocytes: 6 %
Neutrophils Absolute: 3.3 10*3/uL (ref 1.4–7.0)
Neutrophils: 51 %
Platelets: 335 10*3/uL (ref 150–450)
RBC: 4.81 x10E6/uL (ref 3.77–5.28)
RDW: 14.2 % (ref 11.7–15.4)
WBC: 6.4 10*3/uL (ref 3.4–10.8)

## 2019-05-31 LAB — COMPREHENSIVE METABOLIC PANEL
ALT: 13 IU/L (ref 0–32)
AST: 16 IU/L (ref 0–40)
Albumin/Globulin Ratio: 1.2 (ref 1.2–2.2)
Albumin: 4.2 g/dL (ref 3.8–4.9)
Alkaline Phosphatase: 162 IU/L — ABNORMAL HIGH (ref 48–121)
BUN/Creatinine Ratio: 12 (ref 9–23)
BUN: 17 mg/dL (ref 6–24)
Bilirubin Total: <0.2 mg/dL (ref 0.0–1.2)
CO2: 24 mmol/L (ref 20–29)
Calcium: 9.6 mg/dL (ref 8.7–10.2)
Chloride: 101 mmol/L (ref 96–106)
Creatinine, Ser: 1.46 mg/dL — ABNORMAL HIGH (ref 0.57–1.00)
GFR calc Af Amer: 46 mL/min/{1.73_m2} — ABNORMAL LOW (ref >59)
GFR calc non Af Amer: 40 mL/min/{1.73_m2} — ABNORMAL LOW (ref >59)
Globulin, Total: 3.5 g/dL (ref 1.5–4.5)
Glucose: 80 mg/dL (ref 65–99)
Potassium: 4.7 mmol/L (ref 3.5–5.2)
Sodium: 141 mmol/L (ref 134–144)
Total Protein: 7.7 g/dL (ref 6.0–8.5)

## 2019-06-01 NOTE — Telephone Encounter (Signed)
Attempted to call patient. No answer. LVM for patient to call back to discuss results.

## 2019-06-04 NOTE — Progress Notes (Signed)
    SUBJECTIVE:   CHIEF COMPLAINT / HPI:   Molly Dillon is a 59 yo F that presents to the office for the below concerns  Weight loss Started in January. Endorses fatigue, dry mouth, insomnia, polydipsia, polyuria, polyphagia, and odynophagia to solids and liquids. Denies fever and other B symptoms.   COPD No shortness of breath or increased work of breathing today. Continues on home 2L O2. Patient was able to get dulera through prior authorization.   Referral to Pain management Former pain clinic no longer prescribing for unknown reasons. Recently seen by Dr. Brandy Hale. Letter sent to patient for PCP to refer to a different pain clinic.   PERTINENT  PMH / PSH: Hx of weight gain (03/02/19 office note), HIV+, former smoker 18 pk yr hx, HTN  OBJECTIVE:   BP 114/62   Pulse 64   Ht '5\' 4"'$  (1.626 m)   Wt 161 lb (73 kg)   LMP 01/12/2011   SpO2 97%   BMI 27.64 kg/m    General: Appears well, no acute distress. Age appropriate. Cardiac: RRR, normal heart sounds, no murmurs Respiratory: CTAB, normal effort  ASSESSMENT/PLAN:   Weight loss Patient complaint of weight loss with fatigue. She has actually gained  wt. Over the last 4 months. Was 150, now 161. Fatigue likely contributory to insomnia but in relation to odynophagia and patients PMHx as above, malignancy cannot be rule out. CMP with elevated alk phos 162. Would like to obtain a GGT for further evaluation (attempted to contact patient with results;LVM to discuss this). CBC w/diff and glucose wnl. Referral to [facility] for evaluation and probable EGD.  COPD with asthma ([place]) No concerns today. Continue daily use of dulera.   Chronic pain disorder Referral to pain clinic placed during this encounter   Gerlene Fee, [place]

## 2019-06-04 NOTE — Assessment & Plan Note (Addendum)
Referral to pain clinic placed during this encounter

## 2019-06-04 NOTE — Assessment & Plan Note (Addendum)
Patient complaint of weight loss with fatigue. She has actually gained  wt. Over the last 4 months. Was 150, now 161. Fatigue likely contributory to insomnia but in relation to odynophagia and patients PMHx as above, malignancy cannot be rule out. CMP with elevated alk phos 162. Would like to obtain a GGT for further evaluation (attempted to contact patient with results;LVM to discuss this). CBC w/diff and glucose wnl. Referral to [facility] for evaluation and probable EGD.

## 2019-06-04 NOTE — Assessment & Plan Note (Signed)
No concerns today. Continue daily use of dulera.

## 2019-06-05 DIAGNOSIS — J9601 Acute respiratory failure with hypoxia: Secondary | ICD-10-CM | POA: Diagnosis not present

## 2019-06-05 DIAGNOSIS — J449 Chronic obstructive pulmonary disease, unspecified: Secondary | ICD-10-CM | POA: Diagnosis not present

## 2019-06-05 DIAGNOSIS — J441 Chronic obstructive pulmonary disease with (acute) exacerbation: Secondary | ICD-10-CM | POA: Diagnosis not present

## 2019-06-06 ENCOUNTER — Telehealth: Payer: Self-pay

## 2019-06-06 NOTE — Telephone Encounter (Signed)
Informed patient of results and she will come in later for labs to be drawn.  She has other appointments to consider.  Informed patient to call and make an appointment for labs when her calendar is clear.  Patient agreed.  Ozella Almond, [place]

## 2019-06-07 DIAGNOSIS — M542 Cervicalgia: Secondary | ICD-10-CM | POA: Diagnosis not present

## 2019-06-07 DIAGNOSIS — M546 Pain in thoracic spine: Secondary | ICD-10-CM | POA: Diagnosis not present

## 2019-06-07 DIAGNOSIS — Z79899 Other long term (current) drug therapy: Secondary | ICD-10-CM | POA: Diagnosis not present

## 2019-06-07 DIAGNOSIS — M545 Low back pain: Secondary | ICD-10-CM | POA: Diagnosis not present

## 2019-06-07 DIAGNOSIS — M62830 Muscle spasm of back: Secondary | ICD-10-CM | POA: Diagnosis not present

## 2019-06-21 DIAGNOSIS — Z79899 Other long term (current) drug therapy: Secondary | ICD-10-CM | POA: Diagnosis not present

## 2019-06-21 DIAGNOSIS — M545 Low back pain: Secondary | ICD-10-CM | POA: Diagnosis not present

## 2019-06-21 DIAGNOSIS — M62830 Muscle spasm of back: Secondary | ICD-10-CM | POA: Diagnosis not present

## 2019-06-21 DIAGNOSIS — M546 Pain in thoracic spine: Secondary | ICD-10-CM | POA: Diagnosis not present

## 2019-06-21 DIAGNOSIS — M542 Cervicalgia: Secondary | ICD-10-CM | POA: Diagnosis not present

## 2019-06-22 ENCOUNTER — Ambulatory Visit: Payer: Medicare HMO | Admitting: Gastroenterology

## 2019-06-28 MED FILL — AMLODIPINE BESYLATE 10 MG T: 10 | 30 days supply | Qty: 30 | Fill #1

## 2019-06-28 MED FILL — BIKTARVY 50-200-25 MG TABS: 50-200-25 | 30 days supply | Qty: 30 | Fill #6

## 2019-06-28 MED FILL — DULoxetine HCL 30 MG CPEP: 30 | 30 days supply | Qty: 60 | Fill #2

## 2019-06-28 MED FILL — PRAVASTATIN SODIUM 20 MG TA: 20 | 30 days supply | Qty: 30 | Fill #1

## 2019-07-05 DIAGNOSIS — M62830 Muscle spasm of back: Secondary | ICD-10-CM | POA: Diagnosis not present

## 2019-07-05 DIAGNOSIS — M545 Low back pain: Secondary | ICD-10-CM | POA: Diagnosis not present

## 2019-07-05 DIAGNOSIS — M546 Pain in thoracic spine: Secondary | ICD-10-CM | POA: Diagnosis not present

## 2019-07-05 DIAGNOSIS — Z79899 Other long term (current) drug therapy: Secondary | ICD-10-CM | POA: Diagnosis not present

## 2019-07-05 DIAGNOSIS — M542 Cervicalgia: Secondary | ICD-10-CM | POA: Diagnosis not present

## 2019-07-06 DIAGNOSIS — J441 Chronic obstructive pulmonary disease with (acute) exacerbation: Secondary | ICD-10-CM | POA: Diagnosis not present

## 2019-07-06 DIAGNOSIS — J9601 Acute respiratory failure with hypoxia: Secondary | ICD-10-CM | POA: Diagnosis not present

## 2019-07-06 DIAGNOSIS — J449 Chronic obstructive pulmonary disease, unspecified: Secondary | ICD-10-CM | POA: Diagnosis not present

## 2019-07-24 ENCOUNTER — Other Ambulatory Visit: Payer: Self-pay | Admitting: Family Medicine

## 2019-07-24 DIAGNOSIS — M545 Low back pain, unspecified: Secondary | ICD-10-CM

## 2019-07-26 MED FILL — DULoxetine HCL 30 MG CPEP: 30 | 30 days supply | Qty: 60 | Fill #0

## 2019-07-27 MED FILL — BIKTARVY 50-200-25 MG TABS: 50-200-25 | 30 days supply | Qty: 30 | Fill #7

## 2019-07-27 MED FILL — PRAVASTATIN SODIUM 20 MG TA: 20 | 30 days supply | Qty: 30 | Fill #2

## 2019-07-27 MED FILL — AMLODIPINE BESYLATE 10 MG T: 10 | 30 days supply | Qty: 30 | Fill #2

## 2019-07-31 ENCOUNTER — Other Ambulatory Visit: Payer: Self-pay | Admitting: Family Medicine

## 2019-07-31 DIAGNOSIS — J449 Chronic obstructive pulmonary disease, unspecified: Secondary | ICD-10-CM

## 2019-08-01 ENCOUNTER — Other Ambulatory Visit: Payer: Self-pay

## 2019-08-02 ENCOUNTER — Other Ambulatory Visit: Payer: Self-pay

## 2019-08-04 DIAGNOSIS — H0288A Meibomian gland dysfunction right eye, upper and lower eyelids: Secondary | ICD-10-CM | POA: Diagnosis not present

## 2019-08-04 DIAGNOSIS — M546 Pain in thoracic spine: Secondary | ICD-10-CM | POA: Diagnosis not present

## 2019-08-04 DIAGNOSIS — H04123 Dry eye syndrome of bilateral lacrimal glands: Secondary | ICD-10-CM | POA: Diagnosis not present

## 2019-08-04 DIAGNOSIS — M129 Arthropathy, unspecified: Secondary | ICD-10-CM | POA: Diagnosis not present

## 2019-08-04 DIAGNOSIS — Z79899 Other long term (current) drug therapy: Secondary | ICD-10-CM | POA: Diagnosis not present

## 2019-08-04 DIAGNOSIS — M542 Cervicalgia: Secondary | ICD-10-CM | POA: Diagnosis not present

## 2019-08-04 DIAGNOSIS — E559 Vitamin D deficiency, unspecified: Secondary | ICD-10-CM | POA: Diagnosis not present

## 2019-08-04 DIAGNOSIS — M62830 Muscle spasm of back: Secondary | ICD-10-CM | POA: Diagnosis not present

## 2019-08-04 DIAGNOSIS — M545 Low back pain: Secondary | ICD-10-CM | POA: Diagnosis not present

## 2019-08-04 DIAGNOSIS — H0288B Meibomian gland dysfunction left eye, upper and lower eyelids: Secondary | ICD-10-CM | POA: Diagnosis not present

## 2019-08-04 MED FILL — FLUOROMETHOLONE 0.1% DROPS: 0.1 | 25 days supply | Qty: 10 | Fill #0

## 2019-08-05 DIAGNOSIS — J441 Chronic obstructive pulmonary disease with (acute) exacerbation: Secondary | ICD-10-CM | POA: Diagnosis not present

## 2019-08-05 DIAGNOSIS — J449 Chronic obstructive pulmonary disease, unspecified: Secondary | ICD-10-CM | POA: Diagnosis not present

## 2019-08-16 ENCOUNTER — Ambulatory Visit (INDEPENDENT_AMBULATORY_CARE_PROVIDER_SITE_OTHER): Payer: Medicare HMO | Admitting: Podiatry

## 2019-08-16 ENCOUNTER — Other Ambulatory Visit: Payer: Self-pay

## 2019-08-16 DIAGNOSIS — L84 Corns and callosities: Secondary | ICD-10-CM

## 2019-08-16 DIAGNOSIS — M2042 Other hammer toe(s) (acquired), left foot: Secondary | ICD-10-CM

## 2019-08-16 DIAGNOSIS — L989 Disorder of the skin and subcutaneous tissue, unspecified: Secondary | ICD-10-CM | POA: Diagnosis not present

## 2019-08-20 NOTE — Progress Notes (Signed)
    Subjective: Patient is a 59 y.o. female presenting to the office today for evaluation of a very painful third toe to the left foot.  Patient does have history of bunionectomy and hammertoe repair of digits 2-5 left. DOS: 06/24/2017.  Postsurgically the third toe did have a hard time healing and developed complications.  Currently upon presentation today she states that it has been painful for the last 2 years ever since surgery despite shoe gear modifications and trying to wear good supportive shoes to alleviate pressure.  She presents for further treatment evaluation   Past Medical History:  Diagnosis Date  . Acute renal insufficiency 06/06/2014  . Asthma   . Constipation 07/19/2014  . COPD (chronic obstructive pulmonary disease) with acute bronchitis ([place]) 06/06/2014  . Depression   . Depression, major, recurrent, moderate ([place]) 04/02/2015  . Dyspnea   . HIV infection ([place])   . Hyperlipidemia   . Hypertension   . Hypertensive nephropathy 07/19/2014  . Unintentional weight loss 02/13/2015    Objective:  Physical Exam General: Alert and oriented x3 in no acute distress  Dermatology: Hyperkeratotic preulcerative callus noted to the distal tip of the third toe of the left foot  Vascular: Palpable pedal pulses bilaterally. No edema or erythema noted.  Neurological: Epicritic and protective threshold grossly intact bilaterally.   Musculoskeletal Exam: Pain on palpation at the keratotic lesion noted. Range of motion within normal limits bilateral. Muscle strength 5/5 in all groups bilateral.  The third toe of the left foot is plantarflexed compared to the remaining toes.  It appears that with weightbearing she is putting an excessive amount of pressure on the third toe compared to the adjacent digits.  Assessment: 1.  Plantar flexed third toe left foot  2. S/p bunionectomy and hammertoe repair digits 2-5 left DOS 06/24/2017 3.  Preulcerative callus lesion distal tip of the third toe  left  Plan of Care:  1. Patient evaluated. 2. Today we discussed the conservative versus surgical management of the presenting pathology. The patient opts for surgical management. All possible complications and details of the procedure were explained. All patient questions were answered. No guarantees were expressed or implied.  I do believe however that amputation of the third toe of the left foot would help significantly to alleviate her pain and symptoms rather than attempting to salvage the toe. 3. Authorization for surgery was initiated today. Surgery will consist of amputation third toe left foot. 4.  Return to clinic 1 week postop   Edrick Kins, DPM [facility]  Dr. Edrick Kins, [address]                                        [place], [place] [address]                Office [phone]  Fax [phone]

## 2019-08-22 ENCOUNTER — Ambulatory Visit (INDEPENDENT_AMBULATORY_CARE_PROVIDER_SITE_OTHER): Payer: Medicare HMO | Admitting: Gastroenterology

## 2019-08-22 ENCOUNTER — Encounter: Payer: Self-pay | Admitting: Gastroenterology

## 2019-08-22 ENCOUNTER — Other Ambulatory Visit (INDEPENDENT_AMBULATORY_CARE_PROVIDER_SITE_OTHER): Payer: Medicare HMO

## 2019-08-22 VITALS — BP 106/66 | HR 98 | Ht 64.0 in | Wt 159.0 lb

## 2019-08-22 DIAGNOSIS — R6889 Other general symptoms and signs: Secondary | ICD-10-CM | POA: Diagnosis not present

## 2019-08-22 DIAGNOSIS — K5900 Constipation, unspecified: Secondary | ICD-10-CM

## 2019-08-22 DIAGNOSIS — R748 Abnormal levels of other serum enzymes: Secondary | ICD-10-CM

## 2019-08-22 DIAGNOSIS — R630 Anorexia: Secondary | ICD-10-CM | POA: Diagnosis not present

## 2019-08-22 LAB — TSH: TSH: 0.68 u[IU]/mL (ref 0.35–4.50)

## 2019-08-22 LAB — HEPATIC FUNCTION PANEL
ALT: 12 U/L (ref 0–35)
AST: 15 U/L (ref 0–37)
Albumin: 4.4 g/dL (ref 3.5–5.2)
Alkaline Phosphatase: 159 U/L — ABNORMAL HIGH (ref 39–117)
Bilirubin, Direct: 0.1 mg/dL (ref 0.0–0.3)
Total Bilirubin: 0.3 mg/dL (ref 0.2–1.2)
Total Protein: 8.5 g/dL — ABNORMAL HIGH (ref 6.0–8.3)

## 2019-08-22 LAB — PROTIME-INR
INR: 0.9 ratio (ref 0.8–1.0)
Prothrombin Time: 10.4 s (ref 9.6–13.1)

## 2019-08-22 LAB — IGA: IgA: 571 mg/dL — ABNORMAL HIGH (ref 68–378)

## 2019-08-22 LAB — CORTISOL: Cortisol, Plasma: 7.1 ug/dL

## 2019-08-22 MED ORDER — SUPREP BOWEL PREP KIT 17.5-3.13-1.6 GM/177ML PO SOLN
1.0000 | ORAL | 0 refills | Status: DC
Start: 2019-08-22 — End: 2019-11-15

## 2019-08-22 NOTE — Progress Notes (Signed)
[place] VISIT   Primary Care Provider Autry-Lott, [place], [place] [address] [phone]  Referring Provider Autry-Lott, Naaman [address], [address] [place],  [place] [address] [phone]  Patient Profile: Molly Dillon is a 59 y.o. female with a pmh significant for asthma/COPD, anxiety/depression, HIV (well-controlled), hypertension, hyperlipidemia.  The patient presents to the [facility] for an evaluation and management of problem(s) noted below:  Problem List 1. Decreased appetite   2. Change in weight   3. Elevated alkaline phosphatase level   4. Constipation, unspecified constipation type     History of Present Illness This is the patient's first visit to the outpatient [place] clinic.  The patient was referred by her primary team due to issues of concern for odynophagia and decreased appetite with weight loss.  However, the patient states she is not having significant symptoms of odynophagia or dysphagia or GERD.  However decreased appetite persists and she has had some weight changes and some mild weight loss.  Patient has never had an upper or lower endoscopy.  She does have a history of thrush but has not had that for many years.  Patient denies significant nonsteroidal or BC/Goody powder use.  She has remained on HAART therapy for years although has had a recent change within the last year.  She seems to be doing well.  She had a slight elevation in her liver enzymes in the past.  She is previously had hepatitis A and hepatitis B vaccination per her report.  She never been told she has hepatitis C.  GI Review of Systems Positive as above Negative for nausea, vomiting, pain, melena, hematochezia  Review of Systems General: Denies fevers/chills HEENT: Denies oral lesions Cardiovascular: Denies chest pain Pulmonary: Denies shortness of breath Gastroenterological: See HPI Genitourinary: Denies  darkened urine Hematological: Denies easy bruising/bleeding Endocrine: Denies temperature intolerance Dermatological: Denies jaundice Psychological: Mood is stable   Medications Current Outpatient Medications  Medication Sig Dispense Refill  . albuterol (VENTOLIN HFA) 108 (90 Base) MCG/ACT inhaler Inhale 2 puffs into the lungs every 6 (six) hours as needed for wheezing. 18 g 11  . amLODipine (NORVASC) 10 MG tablet TAKE 1 TABLET BY MOUTH ONCE A DAY 30 tablet 6  . BIKTARVY 50-200-25 MG TABS tablet TAKE 1 TABLET BY MOUTH DAILY. 30 tablet 6  . DULERA 200-5 MCG/ACT AERO INHALE 2 PUFFS INTO THE LUNGS TWICE DAILY 13 g 5  . DULoxetine (CYMBALTA) 30 MG capsule TAKE 2 CAPSULES BY MOUTH DAILY 60 capsule 2  . fluorometholone (FML) 0.1 % ophthalmic suspension 1 drop 4 (four) times daily.    Marland Kitchen ibuprofen (ADVIL) 800 MG tablet as needed.     Marland Kitchen LINZESS 145 MCG CAPS capsule Take 145 mcg by mouth daily as needed.    . methocarbamol (ROBAXIN) 750 MG tablet Take by mouth.    Karma Greaser 4 MG/0.1ML LIQD nasal spray kit SMARTSIG:1 Spray(s) Both Nares PRN    . oxyCODONE-acetaminophen (PERCOCET) 10-325 MG tablet Take 1 tablet by mouth in the morning, at noon, in the evening, and at bedtime.     . pravastatin (PRAVACHOL) 20 MG tablet TAKE 1 TABLET (20 MG TOTAL) BY MOUTH DAILY 30 tablet 6  . Na Sulfate-K Sulfate-Mg Sulf (SUPREP BOWEL PREP KIT) 17.5-3.13-1.6 GM/177ML SOLN Take 1 kit by mouth as directed. For colonoscopy prep 354 mL 0   Current Facility-Administered Medications  Medication Dose Route Frequency Provider Last Rate Last Admin  . fluticasone furoate-vilanterol (  BREO ELLIPTA) 100-25 MCG/INH 1 puff  1 puff Inhalation Daily Autry-Lott, Simone, DO        Allergies Allergies  Allergen Reactions  . Dapsone Shortness Of Breath  . Penicillins Anaphylaxis    Has patient had a PCN reaction causing immediate rash, facial/tongue/throat swelling, SOB or lightheadedness with hypotension: Yes Has patient had a PCN  reaction causing severe rash involving mucus membranes or skin necrosis: Yes Has patient had a PCN reaction that required hospitalization: No Has patient had a PCN reaction occurring within the last 10 years: No If all of the above answers are "NO", then may proceed with Cephalosporin use.     Histories Past Medical History:  Diagnosis Date  . Acute renal insufficiency 06/06/2014  . Arthritis   . Asthma   . Constipation 07/19/2014  . COPD (chronic obstructive pulmonary disease) with acute bronchitis ([place]) 06/06/2014  . Depression   . Depression, major, recurrent, moderate ([place]) 04/02/2015  . Dyspnea   . HIV infection ([place])   . Hyperlipidemia   . Hypertension   . Hypertensive nephropathy 07/19/2014  . Unintentional weight loss 02/13/2015   History reviewed. No pertinent surgical history. Social History   Socioeconomic History  . Marital status: Married    Spouse name: Not on file  . Number of children: Not on file  . Years of education: Not on file  . Highest education level: Not on file  Occupational History  . Not on file  Tobacco Use  . Smoking status: Former Smoker    Packs/day: 0.50    Years: 36.00    Pack years: 18.00    Types: Cigarettes    Start date: 01/13/1980    Quit date: 01/03/2018    Years since quitting: 1.6  . Smokeless tobacco: Never Used  . Tobacco comment: down to 5 cigs a day  Vaping Use  . Vaping Use: Never used  Substance and Sexual Activity  . Alcohol use: No    Alcohol/week: 0.0 standard drinks  . Drug use: No  . Sexual activity: Not Currently    Partners: Male    Birth control/protection: None    Comment: given condoms  Other Topics Concern  . Not on file  Social History Narrative  . Not on file   Social Determinants of Health   Financial Resource Strain:   . Difficulty of Paying Living Expenses:   Food Insecurity:   . Worried About Charity fundraiser in the Last Year:   . Arboriculturist in the Last Year:   Transportation Needs:   .  Film/video editor (Medical):   Marland Kitchen Lack of Transportation (Non-Medical):   Physical Activity:   . Days of Exercise per Week:   . Minutes of Exercise per Session:   Stress:   . Feeling of Stress :   Social Connections:   . Frequency of Communication with Friends and Family:   . Frequency of Social Gatherings with Friends and Family:   . Attends Religious Services:   . Active Member of Clubs or Organizations:   . Attends Archivist Meetings:   Marland Kitchen Marital Status:   Intimate Partner Violence:   . Fear of Current or Ex-Partner:   . Emotionally Abused:   Marland Kitchen Physically Abused:   . Sexually Abused:    Family History  Problem Relation Age of Onset  . Diabetes Mother   . Healthy Father   . Diabetes Sister   . Diabetes Sister   . Pancreatic cancer  Paternal Grandmother 39  . Colon cancer Neg Hx   . Stomach cancer Neg Hx   . Esophageal cancer Neg Hx   . Inflammatory bowel disease Neg Hx   . Liver disease Neg Hx   . Rectal cancer Neg Hx    I have reviewed her medical, social, and family history in detail and updated the electronic medical record as necessary.    PHYSICAL EXAMINATION  BP 106/66   Pulse 98   Ht _0  (1.626 m)   Wt 159 lb (72.1 kg)   LMP 01/12/2011   SpO2 95%   BMI 27.29 kg/m  Wt Readings from Last 3 Encounters:  08/22/19 159 lb (72.1 kg)  05/30/19 161 lb (73 kg)  02/17/19 161 lb 3.2 oz (73.1 kg)  GEN: NAD, appears stated age, doesn't appear chronically ill PSYCH: Cooperative, without pressured speech EYE: Conjunctivae pink, sclerae anicteric ENT: MMM, without oral ulcers CV: Nontachycardic RESP: No audible wheezing GI: NABS, soft, NT/ND, without rebound or guarding, MSK/EXT: No lower extremity edema SKIN: No jaundice NEURO:  Alert & Oriented x 3, no focal deficits   REVIEW OF DATA  I reviewed the following data at the time of this encounter:  GI Procedures and Studies  No relevant studies to review  Laboratory Studies  Reviewed those  in epic  Imaging Studies  No relevant studies to review   ASSESSMENT  Ms. Albany is a 59 y.o. female with a pmh significant for asthma/COPD, anxiety/depression, HIV (well-controlled), hypertension, hyperlipidemia.  The patient is seen today for evaluation and management of:  1. Decreased appetite   2. Change in weight   3. Elevated alkaline phosphatase level   4. Constipation, unspecified constipation type    The patient is hemodynamically and clinically stable.  Endoscopic evaluation is reasonable in the setting of her decreased appetite and change in weight.  She is due for colon cancer screening as well.  We will proceed with both an upper and lower endoscopy.  She has had issues with constipation.  We will try to optimize her bowel habits.  She will be on fiber supplementation and will be able to use a higher dose of Linzess in effort of trying to optimize things.  If she does well then we can increase her Linzess prescription or even transition her to 290 should she not have significant improvement.  Liver test abnormalities will be reevaluated with repeat laboratories and further evaluation.  We will ensure she still has hepatitis A hepatitis B immunization status.  The risks and benefits of endoscopic evaluation were discussed with the patient; these include but are not limited to the risk of perforation, infection, bleeding, missed lesions, lack of diagnosis, severe illness requiring hospitalization, as well as anesthesia and sedation related illnesses.  The patient is agreeable to proceed.  All patient questions were answered to the best of my ability, and the patient agrees to the aforementioned plan of action with follow-up as indicated.   PLAN  Laboratories as outlined below Abdominal ultrasound to be obtained Diagnostic endoscopy to be scheduled Screening colonoscopy to be scheduled Samples of Linzess 145 to be given for next couple weeks to see if she does well with this dosing  versus need for further up titration 64 to 80 ounces of fluid intake per day   Orders Placed This Encounter  Procedures  . Procedural/ Surgical Case Request: COLONOSCOPY WITH PROPOFOL, ESOPHAGOGASTRODUODENOSCOPY (EGD) WITH PROPOFOL  . US Abdomen Complete  . Hepatic function panel  . TSH  .  Cortisol  . IgA  . Tissue transglutaminase, IgA  . Cortisol  . INR/PT  . ANA  . Anti-smooth muscle antibody, IgG  . Mitochondrial antibodies  . IgG 4  . Hepatitis A antibody, total  . Hepatitis B Surface AntiBODY  . Hepatitis B surface antigen  . Hepatitis B Core Antibody, total  . Hepatitis C Antibody  . Ambulatory referral to Gastroenterology    New Prescriptions   NA SULFATE-K SULFATE-MG SULF (SUPREP BOWEL PREP KIT) 17.5-3.13-1.6 GM/177ML SOLN    Take 1 kit by mouth as directed. For colonoscopy prep   Modified Medications   No medications on file    Planned Follow Up No follow-ups on file.   Total Time in Face-to-Face and in Coordination of Care for patient including independent/personal interpretation/review of prior testing, medical history, examination, medication adjustment, communicating results with the patient directly, and documentation with the EHR is 45 minutes.   Justice Britain, MD [facility]oscopy Office # [phone]

## 2019-08-22 NOTE — Patient Instructions (Signed)
Your provider has requested that you go to the basement level for lab work before leaving today. Press "B" on the elevator. The lab is located at the first door on the left as you exit the elevator.  You have been scheduled for an abdominal ultrasound at [facility]iology (1st floor of hospital) on 08/29/19 at 10:00am. Please arrive 15 minutes prior to your appointment for registration. Make certain not to have anything to eat or drink 6 hours prior to your appointment. Should you need to reschedule your appointment, please contact radiology at [phone]. This test typically takes about 30 minutes to perform.  You have been scheduled for an endoscopy and colonoscopy. Please follow the written instructions given to you at your visit today. Please pick up your prep supplies at the pharmacy within the next 1-3 days. If you use inhalers (even only as needed), please bring them with you on the day of your procedure.  We have given you samples of Linzess 145mg - please take once daily for 12 days. Please call office or mychart message [place] to let us know if this works for you. If so we will send you a new prescription to the pharmacy.   We have sent the following medications to your pharmacy for you to pick up at your convenience: Suprep  Due to recent changes in healthcare laws, you may see the results of your imaging and laboratory studies on MyChart before your provider has had a chance to review them.  We understand that in some cases there may be results that are confusing or concerning to you. Not all laboratory results come back in the same time frame and the provider may be waiting for multiple results in order to interpret others.  Please give [place] 48 hours in order for your provider to thoroughly review all the results before contacting the office for clarification of your results.   Thank you for choosing me and [facility].  Dr. Rush Landmark

## 2019-08-22 NOTE — H&P (View-Only) (Signed)
 GASTROENTEROLOGY OUTPATIENT CLINIC VISIT   Primary Care Provider Autry-Lott, Simone, DO [address] [place] [address] [phone]  Referring Provider Autry-Lott, Simone, DO [address] [place],   [address] [phone]  Patient Profile: Molly Dillon is a 59 y.o. female with a pmh significant for asthma/COPD, anxiety/depression, HIV (well-controlled), hypertension, hyperlipidemia.  The patient presents to the [facility] for an evaluation and management of problem(s) noted below:  Problem List 1. Decreased appetite   2. Change in weight   3. Elevated alkaline phosphatase level   4. Constipation, unspecified constipation type     History of Present Illness This is the patient's first visit to the outpatient [facility].  The patient was referred by her primary team due to issues of concern for odynophagia and decreased appetite with weight loss.  However, the patient states she is not having significant symptoms of odynophagia or dysphagia or GERD.  However decreased appetite persists and she has had some weight changes and some mild weight loss.  Patient has never had an upper or lower endoscopy.  She does have a history of thrush but has not had that for many years.  Patient denies significant nonsteroidal or BC/Goody powder use.  She has remained on HAART therapy for years although has had a recent change within the last year.  She seems to be doing well.  She had a slight elevation in her liver enzymes in the past.  She is previously had hepatitis A and hepatitis B vaccination per her report.  She never been told she has hepatitis C.  GI Review of Systems Positive as above Negative for nausea, vomiting, pain, melena, hematochezia  Review of Systems General: Denies fevers/chills HEENT: Denies oral lesions Cardiovascular: Denies chest pain Pulmonary: Denies shortness of breath Gastroenterological: See HPI Genitourinary: Denies  darkened urine Hematological: Denies easy bruising/bleeding Endocrine: Denies temperature intolerance Dermatological: Denies jaundice Psychological: Mood is stable   Medications Current Outpatient Medications  Medication Sig Dispense Refill  . albuterol (VENTOLIN HFA) 108 (90 Base) MCG/ACT inhaler Inhale 2 puffs into the lungs every 6 (six) hours as needed for wheezing. 18 g 11  . amLODipine (NORVASC) 10 MG tablet TAKE 1 TABLET BY MOUTH ONCE A DAY 30 tablet 6  . BIKTARVY 50-200-25 MG TABS tablet TAKE 1 TABLET BY MOUTH DAILY. 30 tablet 6  . DULERA 200-5 MCG/ACT AERO INHALE 2 PUFFS INTO THE LUNGS TWICE DAILY 13 g 5  . DULoxetine (CYMBALTA) 30 MG capsule TAKE 2 CAPSULES BY MOUTH DAILY 60 capsule 2  . fluorometholone (FML) 0.1 % ophthalmic suspension 1 drop 4 (four) times daily.    . ibuprofen (ADVIL) 800 MG tablet as needed.     . LINZESS 145 MCG CAPS capsule Take 145 mcg by mouth daily as needed.    . methocarbamol (ROBAXIN) 750 MG tablet Take by mouth.    . NARCAN 4 MG/0.1ML LIQD nasal spray kit SMARTSIG:1 Spray(s) Both Nares PRN    . oxyCODONE-acetaminophen (PERCOCET) 10-325 MG tablet Take 1 tablet by mouth in the morning, at noon, in the evening, and at bedtime.     . pravastatin (PRAVACHOL) 20 MG tablet TAKE 1 TABLET (20 MG TOTAL) BY MOUTH DAILY 30 tablet 6  . Na Sulfate-K Sulfate-Mg Sulf (SUPREP BOWEL PREP KIT) 17.5-3.13-1.6 GM/177ML SOLN Take 1 kit by mouth as directed. For colonoscopy prep 354 mL 0   Current Facility-Administered Medications  Medication Dose Route Frequency Provider Last Rate Last Admin  . fluticasone furoate-vilanterol (  BREO ELLIPTA) 100-25 MCG/INH 1 puff  1 puff Inhalation Daily Autry-Lott, Simone, DO        Allergies Allergies  Allergen Reactions  . Dapsone Shortness Of Breath  . Penicillins Anaphylaxis    Has patient had a PCN reaction causing immediate rash, facial/tongue/throat swelling, SOB or lightheadedness with hypotension: Yes Has patient had a PCN  reaction causing severe rash involving mucus membranes or skin necrosis: Yes Has patient had a PCN reaction that required hospitalization: No Has patient had a PCN reaction occurring within the last 10 years: No If all of the above answers are "NO", then may proceed with Cephalosporin use.     Histories Past Medical History:  Diagnosis Date  . Acute renal insufficiency 06/06/2014  . Arthritis   . Asthma   . Constipation 07/19/2014  . COPD (chronic obstructive pulmonary disease) with acute bronchitis ([place]) 06/06/2014  . Depression   . Depression, major, recurrent, moderate ([place]) 04/02/2015  . Dyspnea   . HIV infection ([place])   . Hyperlipidemia   . Hypertension   . Hypertensive nephropathy 07/19/2014  . Unintentional weight loss 02/13/2015   History reviewed. No pertinent surgical history. Social History   Socioeconomic History  . Marital status: Married    Spouse name: Not on file  . Number of children: Not on file  . Years of education: Not on file  . Highest education level: Not on file  Occupational History  . Not on file  Tobacco Use  . Smoking status: Former Smoker    Packs/day: 0.50    Years: 36.00    Pack years: 18.00    Types: Cigarettes    Start date: 01/13/1980    Quit date: 01/03/2018    Years since quitting: 1.6  . Smokeless tobacco: Never Used  . Tobacco comment: down to 5 cigs a day  Vaping Use  . Vaping Use: Never used  Substance and Sexual Activity  . Alcohol use: No    Alcohol/week: 0.0 standard drinks  . Drug use: No  . Sexual activity: Not Currently    Partners: Male    Birth control/protection: None    Comment: given condoms  Other Topics Concern  . Not on file  Social History Narrative  . Not on file   Social Determinants of Health   Financial Resource Strain:   . Difficulty of Paying Living Expenses:   Food Insecurity:   . Worried About Charity fundraiser in the Last Year:   . Arboriculturist in the Last Year:   Transportation Needs:   .  Film/video editor (Medical):   Marland Kitchen Lack of Transportation (Non-Medical):   Physical Activity:   . Days of Exercise per Week:   . Minutes of Exercise per Session:   Stress:   . Feeling of Stress :   Social Connections:   . Frequency of Communication with Friends and Family:   . Frequency of Social Gatherings with Friends and Family:   . Attends Religious Services:   . Active Member of Clubs or Organizations:   . Attends Archivist Meetings:   Marland Kitchen Marital Status:   Intimate Partner Violence:   . Fear of Current or Ex-Partner:   . Emotionally Abused:   Marland Kitchen Physically Abused:   . Sexually Abused:    Family History  Problem Relation Age of Onset  . Diabetes Mother   . Healthy Father   . Diabetes Sister   . Diabetes Sister   . Pancreatic cancer  Paternal Grandmother 39  . Colon cancer Neg Hx   . Stomach cancer Neg Hx   . Esophageal cancer Neg Hx   . Inflammatory bowel disease Neg Hx   . Liver disease Neg Hx   . Rectal cancer Neg Hx    I have reviewed her medical, social, and family history in detail and updated the electronic medical record as necessary.    PHYSICAL EXAMINATION  BP 106/66   Pulse 98   Ht _0  (1.626 m)   Wt 159 lb (72.1 kg)   LMP 01/12/2011   SpO2 95%   BMI 27.29 kg/m  Wt Readings from Last 3 Encounters:  08/22/19 159 lb (72.1 kg)  05/30/19 161 lb (73 kg)  02/17/19 161 lb 3.2 oz (73.1 kg)  GEN: NAD, appears stated age, doesn't appear chronically ill PSYCH: Cooperative, without pressured speech EYE: Conjunctivae pink, sclerae anicteric ENT: MMM, without oral ulcers CV: Nontachycardic RESP: No audible wheezing GI: NABS, soft, NT/ND, without rebound or guarding, MSK/EXT: No lower extremity edema SKIN: No jaundice NEURO:  Alert & Oriented x 3, no focal deficits   REVIEW OF DATA  I reviewed the following data at the time of this encounter:  GI Procedures and Studies  No relevant studies to review  Laboratory Studies  Reviewed those  in epic  Imaging Studies  No relevant studies to review   ASSESSMENT  Ms. Albany is a 59 y.o. female with a pmh significant for asthma/COPD, anxiety/depression, HIV (well-controlled), hypertension, hyperlipidemia.  The patient is seen today for evaluation and management of:  1. Decreased appetite   2. Change in weight   3. Elevated alkaline phosphatase level   4. Constipation, unspecified constipation type    The patient is hemodynamically and clinically stable.  Endoscopic evaluation is reasonable in the setting of her decreased appetite and change in weight.  She is due for colon cancer screening as well.  We will proceed with both an upper and lower endoscopy.  She has had issues with constipation.  We will try to optimize her bowel habits.  She will be on fiber supplementation and will be able to use a higher dose of Linzess in effort of trying to optimize things.  If she does well then we can increase her Linzess prescription or even transition her to 290 should she not have significant improvement.  Liver test abnormalities will be reevaluated with repeat laboratories and further evaluation.  We will ensure she still has hepatitis A hepatitis B immunization status.  The risks and benefits of endoscopic evaluation were discussed with the patient; these include but are not limited to the risk of perforation, infection, bleeding, missed lesions, lack of diagnosis, severe illness requiring hospitalization, as well as anesthesia and sedation related illnesses.  The patient is agreeable to proceed.  All patient questions were answered to the best of my ability, and the patient agrees to the aforementioned plan of action with follow-up as indicated.   PLAN  Laboratories as outlined below Abdominal ultrasound to be obtained Diagnostic endoscopy to be scheduled Screening colonoscopy to be scheduled Samples of Linzess 145 to be given for next couple weeks to see if she does well with this dosing  versus need for further up titration 64 to 80 ounces of fluid intake per day   Orders Placed This Encounter  Procedures  . Procedural/ Surgical Case Request: COLONOSCOPY WITH PROPOFOL, ESOPHAGOGASTRODUODENOSCOPY (EGD) WITH PROPOFOL  . US Abdomen Complete  . Hepatic function panel  . TSH  .  Cortisol  . IgA  . Tissue transglutaminase, IgA  . Cortisol  . INR/PT  . ANA  . Anti-smooth muscle antibody, IgG  . Mitochondrial antibodies  . IgG 4  . Hepatitis A antibody, total  . Hepatitis B Surface AntiBODY  . Hepatitis B surface antigen  . Hepatitis B Core Antibody, total  . Hepatitis C Antibody  . Ambulatory referral to Gastroenterology    New Prescriptions   NA SULFATE-K SULFATE-MG SULF (SUPREP BOWEL PREP KIT) 17.5-3.13-1.6 GM/177ML SOLN    Take 1 kit by mouth as directed. For colonoscopy prep   Modified Medications   No medications on file    Planned Follow Up No follow-ups on file.   Total Time in Face-to-Face and in Coordination of Care for patient including independent/personal interpretation/review of prior testing, medical history, examination, medication adjustment, communicating results with the patient directly, and documentation with the EHR is 45 minutes.   Justice Britain, MD [facility] Advanced Endoscopy Office # [phone]

## 2019-08-23 LAB — IGG 4: IgG, Subclass 4: 4 mg/dL (ref 2–96)

## 2019-08-24 MED FILL — DULoxetine HCL 30 MG CPEP: 30 | 30 days supply | Qty: 60 | Fill #1

## 2019-08-24 MED FILL — AMLODIPINE BESYLATE 10 MG T: 10 | 30 days supply | Qty: 30 | Fill #3

## 2019-08-24 MED FILL — PRAVASTATIN SODIUM 20 MG TA: 20 | 30 days supply | Qty: 30 | Fill #3

## 2019-08-24 MED FILL — BIKTARVY 50-200-25 MG TABS: 50-200-25 | 30 days supply | Qty: 30 | Fill #8

## 2019-08-25 LAB — ANTI-NUCLEAR AB-TITER (ANA TITER): ANA Titer 1: 1:80 {titer} — ABNORMAL HIGH

## 2019-08-25 LAB — HEPATITIS C ANTIBODY
Hepatitis C Ab: NONREACTIVE
SIGNAL TO CUT-OFF: 0.02 (ref <1.00)

## 2019-08-25 LAB — HEPATITIS B SURFACE ANTIBODY,QUALITATIVE: Hep B S Ab: NONREACTIVE

## 2019-08-25 LAB — ANA: Anti Nuclear Antibody (ANA): POSITIVE — AB

## 2019-08-25 LAB — HEPATITIS B CORE ANTIBODY, TOTAL: Hep B Core Total Ab: NONREACTIVE

## 2019-08-25 LAB — HEPATITIS B SURFACE ANTIGEN: Hepatitis B Surface Ag: NONREACTIVE

## 2019-08-25 LAB — TISSUE TRANSGLUTAMINASE, IGA: (tTG) Ab, IgA: 3 U/mL

## 2019-08-25 LAB — ANTI-SMOOTH MUSCLE ANTIBODY, IGG: Actin (Smooth Muscle) Antibody (IGG): <20 U (ref <20)

## 2019-08-25 LAB — HEPATITIS A ANTIBODY, TOTAL: Hepatitis A AB,Total: REACTIVE — AB

## 2019-08-25 LAB — MITOCHONDRIAL ANTIBODIES: Mitochondrial M2 Ab, IgG: <=20 U

## 2019-08-26 ENCOUNTER — Encounter: Payer: Self-pay | Admitting: Gastroenterology

## 2019-08-28 IMAGING — DX DG CHEST 1V PORT
1 series · 1 of 1 positions shown · non-contrast
Comparison: CXR 03/27/2004 and chest CT 05/04/2017

CLINICAL DATA: Productive cough and dyspnea x2 days.

EXAM:
PORTABLE CHEST 1 VIEW

[chest]
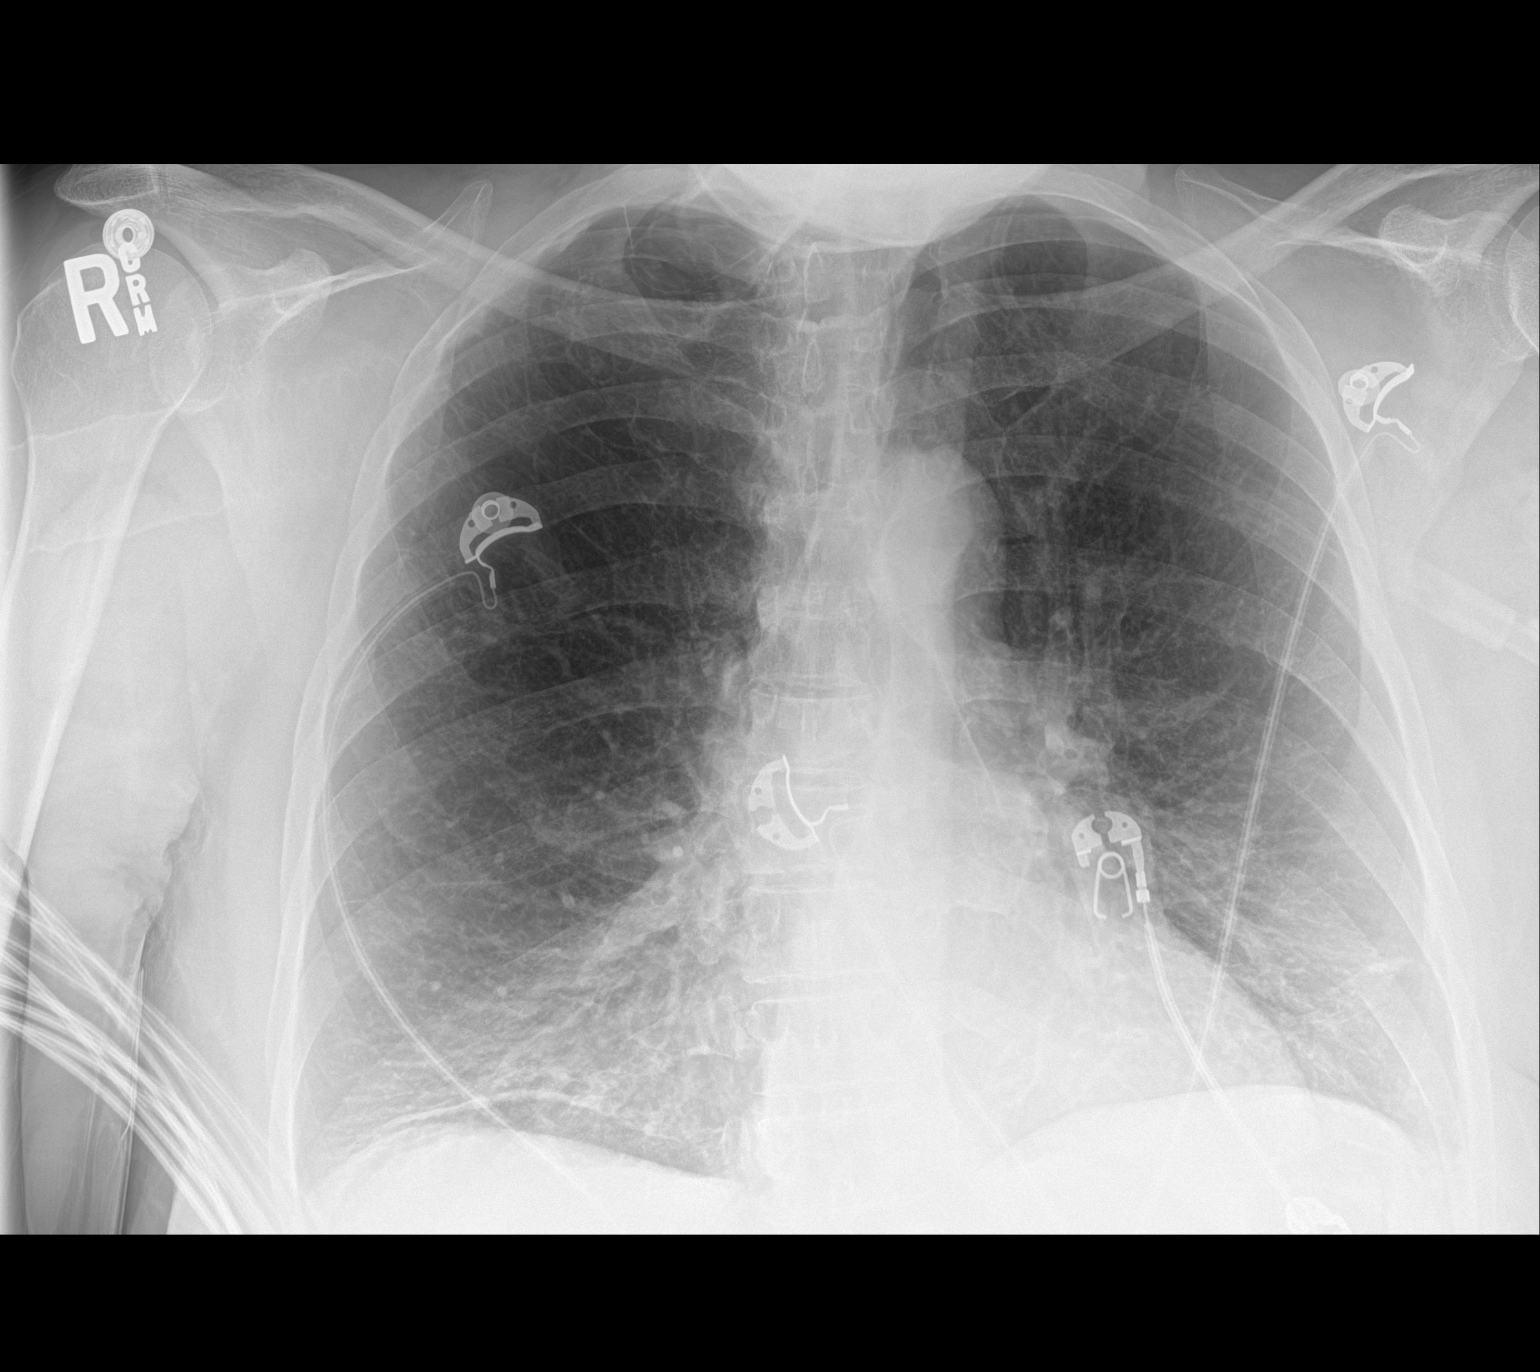

[1 of 1 positions shown; findings below may reference images not displayed]

FINDINGS: Heart is normal in size. There is minimal aortic atherosclerosis
without aneurysmal dilatation. Emphysematous hyperinflation of the
lungs, upper lobe predominant with crowding of lower lobe
interstitial lung markings and subsegmental atelectasis and/or
scarring at each base. No confluent airspace opacities to suggest
pneumonia. No effusion or pneumothorax. No acute osseous
abnormality.
IMPRESSION: Emphysematous hyperinflation of the lungs, upper lobe predominant
with resultant crowding of lower lobe interstitial markings and
subsegmental atelectasis and/or scarring at each lung base.

## 2019-08-29 ENCOUNTER — Ambulatory Visit (HOSPITAL_COMMUNITY): Payer: Medicare HMO

## 2019-08-30 ENCOUNTER — Telehealth: Payer: Self-pay | Admitting: Podiatry

## 2019-08-30 NOTE — Telephone Encounter (Signed)
Pt called saying she thought she missed a call from our office. Stated she has sx scheduled with Dr. Amalia Hailey in the morning and wanted to confirm the time so she doesn't miss it. I told her it was probably [place] calling her and told her to call them back at [phone] so they could tell her what time to arrive for pre-op and what time her sx would be.

## 2019-08-30 NOTE — Telephone Encounter (Signed)
DOS 08.19.2021   Amputation Toe MPJ Joint 3rd Lt [phone])  [employer] Effective 08.01.2021 -  Deductible: [id number] with [phone] met and $0 remains. Out of Pocket: $0 CoInsurance: 20% Copay: $0  Per Almyra Free A no prior authorization is required. Call Ref# [id number].

## 2019-08-31 ENCOUNTER — Other Ambulatory Visit: Payer: Self-pay | Admitting: Podiatry

## 2019-08-31 DIAGNOSIS — M86672 Other chronic osteomyelitis, left ankle and foot: Secondary | ICD-10-CM

## 2019-08-31 MED ORDER — OXYCODONE-ACETAMINOPHEN 10-325 MG PO TABS: 1.0000 | ORAL_TABLET | ORAL | 0 refills | Status: AC | PRN

## 2019-08-31 MED ORDER — DOXYCYCLINE HYCLATE 100 MG PO TABS: 100.0000 mg | ORAL_TABLET | Freq: Two times a day (BID) | ORAL | 0 refills | Status: DC

## 2019-08-31 NOTE — Progress Notes (Signed)
PRN postop 

## 2019-09-06 ENCOUNTER — Encounter (HOSPITAL_COMMUNITY): Payer: Self-pay

## 2019-09-06 ENCOUNTER — Ambulatory Visit (INDEPENDENT_AMBULATORY_CARE_PROVIDER_SITE_OTHER): Payer: Medicare HMO

## 2019-09-06 ENCOUNTER — Ambulatory Visit (INDEPENDENT_AMBULATORY_CARE_PROVIDER_SITE_OTHER): Payer: Medicare HMO | Admitting: Podiatry

## 2019-09-06 ENCOUNTER — Other Ambulatory Visit: Payer: Self-pay

## 2019-09-06 ENCOUNTER — Other Ambulatory Visit (HOSPITAL_COMMUNITY): Payer: Medicare HMO

## 2019-09-06 ENCOUNTER — Ambulatory Visit (HOSPITAL_COMMUNITY): Payer: Medicare HMO

## 2019-09-06 DIAGNOSIS — Z9889 Other specified postprocedural states: Secondary | ICD-10-CM

## 2019-09-06 DIAGNOSIS — M2042 Other hammer toe(s) (acquired), left foot: Secondary | ICD-10-CM

## 2019-09-08 ENCOUNTER — Encounter (HOSPITAL_COMMUNITY): Payer: Self-pay | Admitting: Gastroenterology

## 2019-09-08 ENCOUNTER — Other Ambulatory Visit: Payer: Self-pay

## 2019-09-13 ENCOUNTER — Encounter: Payer: Medicare HMO | Admitting: Podiatry

## 2019-09-15 NOTE — Progress Notes (Signed)
   Subjective:  Patient presents today status post third toe amputation of the left foot. DOS: 08/31/2019.  Patient states that she is doing very well.  She does have some soreness and she is taking the pain medication as prescribed.  No new complaints at this time.  She has been weightbearing postsurgical shoe as directed.  Past Medical History:  Diagnosis Date  . Acute renal insufficiency 06/06/2014  . Arthritis   . Asthma   . Constipation 07/19/2014  . COPD (chronic obstructive pulmonary disease) with acute bronchitis ([place]) 06/06/2014  . Depression   . Depression, major, recurrent, moderate ([place]) 04/02/2015  . Dyspnea   . HIV infection ([place])   . Hyperlipidemia   . Hypertension   . Hypertensive nephropathy 07/19/2014  . On home oxygen therapy    2L  . Unintentional weight loss 02/13/2015      Objective/Physical Exam Neurovascular status intact.  Skin incisions appear to be well coapted with sutures intact. No sign of infectious process noted. No dehiscence. No active bleeding noted. Moderate edema noted to the surgical extremity.  Radiographic Exam:  Absence of the left third toe noted at the MTPJ.  No additional pathology noted compared to prior radiographic exams  Assessment: 1. s/p third toe mentation left foot. DOS: 08/31/2019   Plan of Care:  1. Patient was evaluated. X-rays reviewed 2.  Patient is doing very well.  Dressings were changed.  Continue weightbearing in the postsurgical shoe. 3.  Return to clinic in 1 week for possible suture removal   Edrick Kins, DPM [facility]  Dr. Edrick Kins, [address]                                        [place], [place] [address]                Office [phone]  Fax [phone]

## 2019-09-16 ENCOUNTER — Other Ambulatory Visit (HOSPITAL_COMMUNITY)
Admission: RE | Admit: 2019-09-16 | Discharge: 2019-09-16 | Disposition: A | Discharge status: Home / Self Care | Payer: Medicare Other | Source: Ambulatory Visit | Attending: Gastroenterology | Admitting: Gastroenterology

## 2019-09-16 DIAGNOSIS — Z20822 Contact with and (suspected) exposure to covid-19: Secondary | ICD-10-CM | POA: Diagnosis not present

## 2019-09-16 DIAGNOSIS — Z01812 Encounter for preprocedural laboratory examination: Secondary | ICD-10-CM | POA: Insufficient documentation

## 2019-09-16 LAB — SARS CORONAVIRUS 2 (TAT 6-24 HRS): SARS Coronavirus 2: NEGATIVE

## 2019-09-21 ENCOUNTER — Ambulatory Visit (HOSPITAL_COMMUNITY)
Admission: RE | Admit: 2019-09-21 | Discharge: 2019-09-21 | Disposition: A | Discharge status: Home / Self Care | Payer: Medicare Other | Attending: Gastroenterology | Admitting: Gastroenterology

## 2019-09-21 ENCOUNTER — Ambulatory Visit (HOSPITAL_COMMUNITY): Payer: Medicare Other | Admitting: Certified Registered Nurse Anesthetist

## 2019-09-21 ENCOUNTER — Encounter (HOSPITAL_COMMUNITY): Payer: Self-pay | Admitting: Gastroenterology

## 2019-09-21 ENCOUNTER — Other Ambulatory Visit: Payer: Self-pay

## 2019-09-21 ENCOUNTER — Encounter (HOSPITAL_COMMUNITY)
Admission: RE | Disposition: A | Discharge status: Home / Self Care | Payer: Self-pay | Source: Home / Self Care | Attending: Gastroenterology

## 2019-09-21 DIAGNOSIS — K5730 Diverticulosis of large intestine without perforation or abscess without bleeding: Secondary | ICD-10-CM | POA: Diagnosis not present

## 2019-09-21 DIAGNOSIS — K259 Gastric ulcer, unspecified as acute or chronic, without hemorrhage or perforation: Secondary | ICD-10-CM | POA: Insufficient documentation

## 2019-09-21 DIAGNOSIS — K269 Duodenal ulcer, unspecified as acute or chronic, without hemorrhage or perforation: Secondary | ICD-10-CM

## 2019-09-21 DIAGNOSIS — K641 Second degree hemorrhoids: Secondary | ICD-10-CM | POA: Insufficient documentation

## 2019-09-21 DIAGNOSIS — R6881 Early satiety: Secondary | ICD-10-CM | POA: Diagnosis not present

## 2019-09-21 DIAGNOSIS — Z1211 Encounter for screening for malignant neoplasm of colon: Secondary | ICD-10-CM | POA: Insufficient documentation

## 2019-09-21 DIAGNOSIS — K2970 Gastritis, unspecified, without bleeding: Secondary | ICD-10-CM | POA: Insufficient documentation

## 2019-09-21 DIAGNOSIS — R6889 Other general symptoms and signs: Secondary | ICD-10-CM

## 2019-09-21 DIAGNOSIS — K5900 Constipation, unspecified: Secondary | ICD-10-CM

## 2019-09-21 DIAGNOSIS — K319 Disease of stomach and duodenum, unspecified: Secondary | ICD-10-CM | POA: Diagnosis not present

## 2019-09-21 DIAGNOSIS — R634 Abnormal weight loss: Secondary | ICD-10-CM | POA: Diagnosis not present

## 2019-09-21 DIAGNOSIS — R630 Anorexia: Secondary | ICD-10-CM

## 2019-09-21 DIAGNOSIS — R748 Abnormal levels of other serum enzymes: Secondary | ICD-10-CM

## 2019-09-21 HISTORY — DX: Sleep apnea, unspecified: G47.30

## 2019-09-21 HISTORY — PX: BIOPSY: SHX5522

## 2019-09-21 HISTORY — DX: Dependence on supplemental oxygen: Z99.81

## 2019-09-21 HISTORY — PX: COLONOSCOPY WITH PROPOFOL: SHX5780

## 2019-09-21 HISTORY — PX: ESOPHAGOGASTRODUODENOSCOPY (EGD) WITH PROPOFOL: SHX5813

## 2019-09-21 SURGERY — COLONOSCOPY WITH PROPOFOL
Anesthesia: Monitor Anesthesia Care

## 2019-09-21 MED ORDER — SODIUM CHLORIDE 0.9 % IV SOLN: INTRAVENOUS | Status: DC

## 2019-09-21 MED ORDER — LACTATED RINGERS IV SOLN: INTRAVENOUS | Status: DC

## 2019-09-21 MED ORDER — PROPOFOL 1000 MG/100ML IV EMUL
INTRAVENOUS | Status: AC
  Filled 2019-09-21: qty 100

## 2019-09-21 MED ORDER — LIDOCAINE 2% (20 MG/ML) 5 ML SYRINGE
INTRAMUSCULAR | Status: DC | PRN
  Administered 2019-09-21: 60 mg via INTRAVENOUS

## 2019-09-21 MED ORDER — LACTATED RINGERS IV SOLN: INTRAVENOUS | Status: DC | PRN

## 2019-09-21 MED ORDER — OMEPRAZOLE 20 MG PO CPDR: 40.0000 mg | DELAYED_RELEASE_CAPSULE | Freq: Two times a day (BID) | ORAL | 4 refills | Status: AC

## 2019-09-21 MED ORDER — PROPOFOL 500 MG/50ML IV EMUL
INTRAVENOUS | Status: DC | PRN
  Administered 2019-09-21: 150 ug/kg/min via INTRAVENOUS

## 2019-09-21 MED ORDER — GLYCOPYRROLATE PF 0.2 MG/ML IJ SOSY
PREFILLED_SYRINGE | INTRAMUSCULAR | Status: DC | PRN
  Administered 2019-09-21: .2 mg via INTRAVENOUS

## 2019-09-21 MED ORDER — PROPOFOL 10 MG/ML IV BOLUS
INTRAVENOUS | Status: DC | PRN
  Administered 2019-09-21: 20 mg via INTRAVENOUS
  Administered 2019-09-21 (×2): 10 mg via INTRAVENOUS
  Administered 2019-09-21: 30 mg via INTRAVENOUS

## 2019-09-21 MED FILL — AMLODIPINE BESYLATE 10 MG T: 10 | 30 days supply | Qty: 30 | Fill #4

## 2019-09-21 MED FILL — DULoxetine HCL 30 MG CPEP: 30 | 30 days supply | Qty: 60 | Fill #2

## 2019-09-21 MED FILL — BIKTARVY 50-200-25 MG TABS: 50-200-25 | 30 days supply | Qty: 30 | Fill #0

## 2019-09-21 MED FILL — PRAVASTATIN SODIUM 20 MG TA: 20 | 30 days supply | Qty: 30 | Fill #4

## 2019-09-21 SURGICAL SUPPLY — 25 items

## 2019-09-21 NOTE — Discharge Instructions (Signed)
YOU HAD AN ENDOSCOPIC PROCEDURE TODAY: Refer to the procedure report and other information in the discharge instructions given to you for any specific questions about what was found during the examination. If this information does not answer your questions, please call [place] office at [phone] to clarify.  ° °YOU SHOULD EXPECT: Some feelings of bloating in the abdomen. Passage of more gas than usual. Walking can help get rid of the air that was put into your GI tract during the procedure and reduce the bloating. If you had a lower endoscopy (such as a colonoscopy or flexible sigmoidoscopy) you may notice spotting of blood in your stool or on the toilet paper. Some abdominal soreness may be present for a day or two, also. ° °DIET: Your first meal following the procedure should be a light meal and then it is ok to progress to your normal diet. A half-sandwich or bowl of soup is an example of a good first meal. Heavy or fried foods are harder to digest and may make you feel nauseous or bloated. Drink plenty of fluids but you should avoid alcoholic beverages for 24 hours. If you had a esophageal dilation, please see attached instructions for diet.   ° °ACTIVITY: Your care partner should take you home directly after the procedure. You should plan to take it easy, moving slowly for the rest of the day. You can resume normal activity the day after the procedure however YOU SHOULD NOT DRIVE, use power tools, machinery or perform tasks that involve climbing or major physical exertion for 24 hours (because of the sedation medicines used during the test).  ° °SYMPTOMS TO REPORT IMMEDIATELY: °A gastroenterologist can be reached at any hour. Please call [phone]  for any of the following symptoms:  °Following lower endoscopy (colonoscopy, flexible sigmoidoscopy) °Excessive amounts of blood in the stool  °Significant tenderness, worsening of abdominal pains  °Swelling of the abdomen that is new, acute  °Fever of 100° or  higher  °Following upper endoscopy (EGD, EUS, ERCP, esophageal dilation) °Vomiting of blood or coffee ground material  °New, significant abdominal pain  °New, significant chest pain or pain under the shoulder blades  °Painful or persistently difficult swallowing  °New shortness of breath  °Black, tarry-looking or red, bloody stools ° °FOLLOW UP:  °If any biopsies were taken you will be contacted by phone or by letter within the next 1-3 weeks. Call [phone]  if you have not heard about the biopsies in 3 weeks.  °Please also call with any specific questions about appointments or follow up tests. ° °

## 2019-09-21 NOTE — Interval H&P Note (Signed)
History and Physical Interval Note:  09/21/2019 10:19 AM  Molly Dillon  has presented today for surgery, with the diagnosis of Decrease in Appetite, Changes in Weight, Elevaed Alkaline Phos, Constipation.  The various methods of treatment have been discussed with the patient and family. After consideration of risks, benefits and other options for treatment, the patient has consented to  Procedure(s): COLONOSCOPY WITH PROPOFOL (N/A) ESOPHAGOGASTRODUODENOSCOPY (EGD) WITH PROPOFOL (N/A) as a surgical intervention.  The patient's history has been reviewed, patient examined, no change in status, stable for surgery.  I have reviewed the patient's chart and labs.  Questions were answered to the patient's satisfaction.     [employer]

## 2019-09-21 NOTE — Transfer of Care (Signed)
Immediate Anesthesia Transfer of Care Note  Patient: Molly Dillon  Procedure(s) Performed: COLONOSCOPY WITH PROPOFOL (N/A ) ESOPHAGOGASTRODUODENOSCOPY (EGD) WITH PROPOFOL (N/A ) BIOPSY  Patient Location: PACU and Endoscopy Unit  Anesthesia Type:MAC  Level of Consciousness: awake, alert  and oriented  Airway & Oxygen Therapy: Patient Spontanous Breathing and Patient connected to face mask oxygen  Post-op Assessment: Report given to RN and Post -op Vital signs reviewed and stable  Post vital signs: Reviewed and stable  Last Vitals:  Vitals Value Taken Time  BP    Temp    Pulse 101 09/21/19 1136  Resp 22 09/21/19 1136  SpO2 100 % 09/21/19 1136  Vitals shown include unvalidated device data.  Last Pain:  Vitals:   09/21/19 0909  TempSrc: Oral  PainSc: 0-No pain         Complications: No complications documented.

## 2019-09-21 NOTE — Op Note (Signed)
[facility] Patient Name: Molly Dillon Procedure Date: 09/21/2019 MRN: [id number] Attending MD: Corliss Parish , MD Date of Birth: Sep 20, 1960 CSN: [id number] Age: 59 Admit Type: Outpatient Procedure:                Colonoscopy Indications:              Screening for colorectal malignant neoplasm, This                            is the patient's first colonoscopy, Incidental                            wIncidental - Weight loss, Incidental constipation                            noted Providers:                Corliss Parish, MD, Tillie Fantasia, RN, Michele Mcalpine Technician, Steffanie Dunn CRNA Referring MD:             Lavonda Jumbo Medicines:                Monitored Anesthesia Care Complications:            No immediate complications. Estimated Blood Loss:     Estimated blood loss: none. Procedure:                Pre-Anesthesia Assessment:                           - Prior to the procedure, a History and Physical                            was performed, and patient medications and                            allergies were reviewed. The patient's tolerance of                            previous anesthesia was also reviewed. The risks                            and benefits of the procedure and the sedation                            options and risks were discussed with the patient.                            All questions were answered, and informed consent                            was obtained. Prior Anticoagulants: The patient has                            taken no previous anticoagulant  or antiplatelet                            agents. ASA Grade Assessment: III - A patient with                            severe systemic disease. After reviewing the risks                            and benefits, the patient was deemed in                            satisfactory condition to undergo the procedure.                            After obtaining informed consent, the colonoscope                            was passed under direct vision. Throughout the                            procedure, the patient's blood pressure, pulse, and                            oxygen saturations were monitored continuously. The                            CF-HQ190L ([id number] Olympus colonoscope was                            introduced through the anus and advanced to the 5                            cm into the ileum. The colonoscopy was performed                            without difficulty. The patient tolerated the                            procedure. The quality of the bowel preparation was                            good. The terminal ileum, ileocecal valve,                            appendiceal orifice, and rectum were photographed. Scope In: 11:16:50 AM Scope Out: 11:30:02 AM Scope Withdrawal Time: 0 hours 9 minutes 21 seconds  Total Procedure Duration: 0 hours 13 minutes 12 seconds  Findings:      The digital rectal exam findings include hemorrhoids. Pertinent       negatives include no palpable rectal lesions.      Many small-mouthed diverticula were found in the entire colon (though       largest burden is in the [place]/RS regions).      Normal mucosa was  found in the entire colon otherwise.      Non-bleeding non-thrombosed internal hemorrhoids were found during       retroflexion, during perianal exam and during digital exam. The       hemorrhoids were Grade II (internal hemorrhoids that prolapse but reduce       spontaneously). Impression:               - Hemorrhoids found on digital rectal exam.                           - Diverticulosis in the entire examined colon                            (though largest burden in the left colon).                           - Normal mucosa in the entire examined colon.                           - Non-bleeding non-thrombosed internal hemorrhoids. Moderate Sedation:      Not Applicable  - Patient had care per Anesthesia. Recommendation:           - The patient will be observed post-procedure,                            until all discharge criteria are met.                           - Discharge patient to home.                           - Patient has a contact number available for                            emergencies. The signs and symptoms of potential                            delayed complications were discussed with the                            patient. Return to normal activities tomorrow.                            Written discharge instructions were provided to the                            patient.                           - High fiber diet.                           - Use FiberCon 1-2 tablets PO daily.                           - Continue Linzess if helpful otherwise may  increase to 290 mcg or consider other medications                            for optimization of constipation.                           - Continue present medications.                           - Repeat colonoscopy in 10 years for screening                            purposes.                           - The findings and recommendations were discussed                            with the patient.                           - The findings and recommendations were discussed                            with the patient's family. Procedure Code(s):        --- Professional ---                           [phone], Colonoscopy, flexible; diagnostic, including                            collection of specimen(s) by brushing or washing,                            when performed (separate procedure) Diagnosis Code(s):        --- Professional ---                           Z12.11, Encounter for screening for malignant                            neoplasm of colon                           K64.1, Second degree hemorrhoids                           K57.30, Diverticulosis of large  intestine without                            perforation or abscess without bleeding CPT copyright 2019 American Medical Association. All rights reserved. The codes documented in this report are preliminary and upon coder review may  be revised to meet current compliance requirements. Justice Britain, MD 09/21/2019 11:44:49 AM Number of Addenda: 0

## 2019-09-21 NOTE — Anesthesia Preprocedure Evaluation (Addendum)
Anesthesia Evaluation  Patient identified by MRN, date of birth, ID band Patient awake    Reviewed: Allergy & Precautions, NPO status , Patient's Chart, lab work & pertinent test results  Airway Mallampati: II  TM Distance: >3 FB Neck ROM: Full    Dental  (+) Poor Dentition, Edentulous Upper, Missing, Dental Advisory Given   Pulmonary shortness of breath, asthma , sleep apnea , COPD, Patient abstained from smoking., former smoker,    Pulmonary exam normal breath sounds clear to auscultation + decreased breath sounds+ wheezing      Cardiovascular hypertension, Pt. on medications Normal cardiovascular exam Rhythm:Regular Rate:Normal     Neuro/Psych PSYCHIATRIC DISORDERS Depression negative neurological ROS     GI/Hepatic negative GI ROS, Neg liver ROS,   Endo/Other  negative endocrine ROS  Renal/GU Renal disease     Musculoskeletal  (+) Arthritis ,   Abdominal   Peds  Hematology negative hematology ROS (+)   Anesthesia Other Findings   Reproductive/Obstetrics                            Anesthesia Physical Anesthesia Plan  ASA: III  Anesthesia Plan: MAC   Post-op Pain Management:    Induction: Intravenous  PONV Risk Score and Plan: Propofol infusion, TIVA and Treatment may vary due to age or medical condition  Airway Management Planned: Natural Airway  Additional Equipment:   Intra-op Plan:   Post-operative Plan:   Informed Consent: I have reviewed the patients History and Physical, chart, labs and discussed the procedure including the risks, benefits and alternatives for the proposed anesthesia with the patient or authorized representative who has indicated his/her understanding and acceptance.     Dental advisory given  Plan Discussed with: CRNA  Anesthesia Plan Comments:         Anesthesia Quick Evaluation

## 2019-09-21 NOTE — Op Note (Signed)
[facility] Patient Name: Molly Dillon Procedure Date: 09/21/2019 MRN: [id number] Attending MD: Justice Britain , MD Date of Birth: 03-Jul-1960 CSN: [id number] Age: 59 Admit Type: Outpatient Procedure:                Upper GI endoscopy Indications:              Early satiety, Weight loss Providers:                Justice Britain, MD, Carmie End, RN, Tyna Jaksch Technician, Caryl Pina CRNA Referring MD:              Medicines:                Monitored Anesthesia Care Complications:            No immediate complications. Estimated Blood Loss:     Estimated blood loss was minimal. Procedure:                Pre-Anesthesia Assessment:                           - Prior to the procedure, a History and Physical                            was performed, and patient medications and                            allergies were reviewed. The patient's tolerance of                            previous anesthesia was also reviewed. The risks                            and benefits of the procedure and the sedation                            options and risks were discussed with the patient.                            All questions were answered, and informed consent                            was obtained. Prior Anticoagulants: The patient has                            taken no previous anticoagulant or antiplatelet                            agents. ASA Grade Assessment: III - A patient with                            severe systemic disease. After reviewing the risks  and benefits, the patient was deemed in                            satisfactory condition to undergo the procedure.                           After obtaining informed consent, the endoscope was                            passed under direct vision. Throughout the                            procedure, the patient's blood pressure, pulse, and                             oxygen saturations were monitored continuously. The                            [id number]-[id number] ([id number] Olympus gastroscope was                            introduced through the mouth, and advanced to the                            second part of duodenum. The upper GI endoscopy was                            accomplished without difficulty. The patient                            tolerated the procedure. Scope In: Scope Out: Findings:      No gross lesions were noted in the entire esophagus.      The Z-line was irregular and was found 40 cm from the incisors.      Scattered moderate inflammation characterized by erosions, erythema and       granularity was found in the gastric body, at the incisura and in the       gastric antrum.      A small healed ulcer was found in the gastric antrum.      Few non-bleeding superficial gastric ulcers with a clean ulcer base       (Forrest Class III) were found on the greater curvature of the stomach.       The largest lesion was 10 mm in largest dimension.      No gross lesions were noted in the entire examined stomach. Biopsies       were taken with a cold forceps for histology and Helicobacter pylori       testing.      A single 5 mm mucosal nodule was found in the duodenal bulb. Biopsies       were taken with a cold forceps for histology.      Patchy mildly erythematous mucosa without active bleeding and with no       stigmata of bleeding was found in the duodenal bulb.      One non-bleeding superficial duodenal ulcer with a clean ulcer base       (  Forrest Class III) was found in the first portion of the duodenum. The       lesion was 6 mm in largest dimension.      No gross lesions were noted in the second portion of the duodenum. Impression:               - No gross lesions in esophagus. Z-line irregular,                            40 cm from the incisors.                           - Gastritis. Scar in the gastric antrum - likely                             healing ulcer. Non-bleeding gastric ulcers with a                            clean ulcer base (Forrest Class III). No other                            gross lesions in the stomach. Biopsied.                           - Mucosal nodule found in the duodenum. Biopsied.                           - Erythematous duodenopathy.                           - Non-bleeding duodenal ulcer with a clean ulcer                            base (Forrest Class III).                           - No gross lesions in the second portion of the                            duodenum. Moderate Sedation:      Not Applicable - Patient had care per Anesthesia. Recommendation:           - Proceed to scheduled colonoscopy.                           - Await pathology results.                           - Observe patient's clinical course.                           - Initiate Omeprazole 40 mg twice daily (30 minutes                            before breakfast and dinner).                           -  Repeat EGD in 22-months to ensure healing of                            gastric ulcers.                           - No aspirin, ibuprofen, naproxen, or other                            non-steroidal anti-inflammatory drugs.                           - The findings and recommendations were discussed                            with the patient.                           - The findings and recommendations were discussed                            with the patient's family. Procedure Code(s):        --- Professional ---                           [phone], Esophagogastroduodenoscopy, flexible,                            transoral; with biopsy, single or multiple Diagnosis Code(s):        --- Professional ---                           K22.8, Other specified diseases of esophagus                           K29.70, Gastritis, unspecified, without bleeding                           K31.89, Other diseases of stomach and  duodenum                           K25.9, Gastric ulcer, unspecified as acute or                            chronic, without hemorrhage or perforation                           K26.9, Duodenal ulcer, unspecified as acute or                            chronic, without hemorrhage or perforation                           R68.81, Early satiety                           R63.4, Abnormal weight loss CPT  copyright 2019 American Medical Association. All rights reserved. The codes documented in this report are preliminary and upon coder review may  be revised to meet current compliance requirements. Justice Britain, MD 09/21/2019 11:39:44 AM Number of Addenda: 0

## 2019-09-22 ENCOUNTER — Other Ambulatory Visit: Payer: Self-pay

## 2019-09-22 LAB — SURGICAL PATHOLOGY

## 2019-09-22 NOTE — Anesthesia Postprocedure Evaluation (Signed)
Anesthesia Post Note  Patient: Molly Dillon  Procedure(s) Performed: COLONOSCOPY WITH PROPOFOL (N/A ) ESOPHAGOGASTRODUODENOSCOPY (EGD) WITH PROPOFOL (N/A ) BIOPSY     Patient location during evaluation: PACU Anesthesia Type: MAC Level of consciousness: awake and alert Pain management: pain level controlled Vital Signs Assessment: post-procedure vital signs reviewed and stable Respiratory status: spontaneous breathing Cardiovascular status: stable Anesthetic complications: no   No complications documented.  Last Vitals:  Vitals:   09/21/19 1150 09/21/19 1200  BP: (!) 133/94 (!) 141/86  Pulse:  81  Resp: (!) 21 19  Temp:    SpO2: 100% 100%    Last Pain:  Vitals:   09/21/19 1200  TempSrc:   PainSc: 0-No pain                 Nolon Nations

## 2019-09-26 ENCOUNTER — Encounter (HOSPITAL_COMMUNITY): Payer: Self-pay | Admitting: Gastroenterology

## 2019-09-27 ENCOUNTER — Other Ambulatory Visit: Payer: Self-pay

## 2019-09-27 ENCOUNTER — Ambulatory Visit (INDEPENDENT_AMBULATORY_CARE_PROVIDER_SITE_OTHER): Payer: Medicare Other | Admitting: Podiatry

## 2019-09-27 DIAGNOSIS — Z9889 Other specified postprocedural states: Secondary | ICD-10-CM

## 2019-09-27 DIAGNOSIS — M2042 Other hammer toe(s) (acquired), left foot: Secondary | ICD-10-CM

## 2019-09-27 NOTE — Progress Notes (Signed)
° °  Subjective:  Patient presents today status post third toe amputation of the left foot. DOS: 08/31/2019.  Patient is doing very well.  She has been walking and ambulating just fine in regular shoes.  She presents today to have the sutures removed.  No new complaints at this time  Past Medical History:  Diagnosis Date   Acute renal insufficiency 06/06/2014   Arthritis    Asthma    Constipation 07/19/2014   COPD (chronic obstructive pulmonary disease) with acute bronchitis ([place]) 06/06/2014   Depression    Depression, major, recurrent, moderate ([place]) 04/02/2015   Dyspnea    HIV infection ([place])    Hyperlipidemia    Hypertension    Hypertensive nephropathy 07/19/2014   On home oxygen therapy    2L   Sleep apnea    Unintentional weight loss 02/13/2015      Objective/Physical Exam Neurovascular status intact.  Skin incisions appear to be well coapted with sutures intact. No sign of infectious process noted. No dehiscence. No active bleeding noted. Moderate edema noted to the surgical extremity.  Assessment: 1. s/p third toe mentation left foot. DOS: 08/31/2019   Plan of Care:  1. Patient was evaluated.  2.  Sutures removed today 3.  Patient may resume full activity no restrictions 4.  Recommend good supportive shoe gear 5.  Return to clinic as needed  Edrick Kins, DPM [facility]  Dr. Edrick Kins, [address]                                        [place], [place] [address]                Office [phone]  Fax [phone]

## 2019-09-29 ENCOUNTER — Encounter: Payer: Self-pay | Admitting: Gastroenterology

## 2019-10-02 ENCOUNTER — Other Ambulatory Visit: Payer: Medicare HMO

## 2019-10-05 ENCOUNTER — Other Ambulatory Visit: Payer: Self-pay

## 2019-10-05 ENCOUNTER — Other Ambulatory Visit: Payer: Medicare Other

## 2019-10-05 DIAGNOSIS — B20 Human immunodeficiency virus [HIV] disease: Secondary | ICD-10-CM

## 2019-10-06 LAB — T-HELPER CELL (CD4) - (RCID CLINIC ONLY)
CD4 % Helper T Cell: 18 % — ABNORMAL LOW (ref 33–65)
CD4 T Cell Abs: 488 /uL (ref 400–1790)

## 2019-10-09 LAB — COMPLETE METABOLIC PANEL WITH GFR
AG Ratio: 1.3 (calc) (ref 1.0–2.5)
ALT: 13 U/L (ref 6–29)
AST: 15 U/L (ref 10–35)
Albumin: 4.1 g/dL (ref 3.6–5.1)
Alkaline phosphatase (APISO): 139 U/L (ref 37–153)
BUN/Creatinine Ratio: 12 (calc) (ref 6–22)
BUN: 18 mg/dL (ref 7–25)
CO2: 29 mmol/L (ref 20–32)
Calcium: 9.6 mg/dL (ref 8.6–10.4)
Chloride: 105 mmol/L (ref 98–110)
Creat: 1.45 mg/dL — ABNORMAL HIGH (ref 0.50–1.05)
GFR, Est African American: 46 mL/min/{1.73_m2} — ABNORMAL LOW (ref >=60)
GFR, Est Non African American: 40 mL/min/{1.73_m2} — ABNORMAL LOW (ref >=60)
Globulin: 3.1 g/dL (calc) (ref 1.9–3.7)
Glucose, Bld: 110 mg/dL — ABNORMAL HIGH (ref 65–99)
Potassium: 4.4 mmol/L (ref 3.5–5.3)
Sodium: 140 mmol/L (ref 135–146)
Total Bilirubin: 0.2 mg/dL (ref 0.2–1.2)
Total Protein: 7.2 g/dL (ref 6.1–8.1)

## 2019-10-09 LAB — CBC WITH DIFFERENTIAL/PLATELET
Absolute Monocytes: 455 cells/uL (ref 200–950)
Basophils Absolute: 42 cells/uL (ref 0–200)
Basophils Relative: 0.6 %
Eosinophils Absolute: 112 cells/uL (ref 15–500)
Eosinophils Relative: 1.6 %
HCT: 44.3 % (ref 35.0–45.0)
Hemoglobin: 15.1 g/dL (ref 11.7–15.5)
Lymphs Abs: 3024 cells/uL (ref 850–3900)
MCH: 32.3 pg (ref 27.0–33.0)
MCHC: 34.1 g/dL (ref 32.0–36.0)
MCV: 94.7 fL (ref 80.0–100.0)
MPV: 9.5 fL (ref 7.5–12.5)
Monocytes Relative: 6.5 %
Neutro Abs: 3367 cells/uL (ref 1500–7800)
Neutrophils Relative %: 48.1 %
Platelets: 301 10*3/uL (ref 140–400)
RBC: 4.68 10*6/uL (ref 3.80–5.10)
RDW: 13.6 % (ref 11.0–15.0)
Total Lymphocyte: 43.2 %
WBC: 7 10*3/uL (ref 3.8–10.8)

## 2019-10-09 LAB — LIPID PANEL
Cholesterol: 237 mg/dL — ABNORMAL HIGH (ref <200)
HDL: 51 mg/dL (ref >=50)
LDL Cholesterol (Calc): 159 mg/dL (calc) — ABNORMAL HIGH
Non-HDL Cholesterol (Calc): 186 mg/dL (calc) — ABNORMAL HIGH (ref <130)
Total CHOL/HDL Ratio: 4.6 (calc) (ref <5.0)
Triglycerides: 148 mg/dL (ref <150)

## 2019-10-09 LAB — HIV-1 RNA QUANT-NO REFLEX-BLD
HIV 1 RNA Quant: <20 Copies/mL
HIV-1 RNA Quant, Log: <1.3 Log cps/mL

## 2019-10-09 LAB — RPR: RPR Ser Ql: NONREACTIVE

## 2019-10-16 ENCOUNTER — Ambulatory Visit: Payer: Medicare HMO | Admitting: Infectious Disease

## 2019-10-17 ENCOUNTER — Other Ambulatory Visit: Payer: Self-pay | Admitting: Family Medicine

## 2019-10-17 DIAGNOSIS — M545 Low back pain, unspecified: Secondary | ICD-10-CM

## 2019-10-18 MED FILL — DULoxetine HCL 30 MG CPEP: 30 | 30 days supply | Qty: 60 | Fill #0

## 2019-10-25 MED FILL — PRAVASTATIN SODIUM 20 MG TA: 20 | 30 days supply | Qty: 30 | Fill #5

## 2019-10-25 MED FILL — BIKTARVY 50-200-25 MG TABS: 50-200-25 | 30 days supply | Qty: 30 | Fill #1

## 2019-10-25 MED FILL — AMLODIPINE BESYLATE 10 MG T: 10 | 30 days supply | Qty: 30 | Fill #5

## 2019-11-09 ENCOUNTER — Ambulatory Visit: Payer: Medicare Other | Admitting: Infectious Disease

## 2019-11-15 ENCOUNTER — Other Ambulatory Visit: Payer: Self-pay | Admitting: Infectious Disease

## 2019-11-15 ENCOUNTER — Ambulatory Visit (INDEPENDENT_AMBULATORY_CARE_PROVIDER_SITE_OTHER): Payer: Medicare Other | Admitting: Infectious Disease

## 2019-11-15 ENCOUNTER — Other Ambulatory Visit: Payer: Self-pay

## 2019-11-15 VITALS — BP 130/85 | HR 82 | Resp 16 | Ht 64.0 in | Wt 161.0 lb

## 2019-11-15 DIAGNOSIS — I10 Essential (primary) hypertension: Secondary | ICD-10-CM | POA: Diagnosis not present

## 2019-11-15 DIAGNOSIS — J449 Chronic obstructive pulmonary disease, unspecified: Secondary | ICD-10-CM

## 2019-11-15 DIAGNOSIS — Z23 Encounter for immunization: Secondary | ICD-10-CM

## 2019-11-15 DIAGNOSIS — E782 Mixed hyperlipidemia: Secondary | ICD-10-CM | POA: Diagnosis not present

## 2019-11-15 DIAGNOSIS — B20 Human immunodeficiency virus [HIV] disease: Secondary | ICD-10-CM | POA: Diagnosis not present

## 2019-11-15 MED ORDER — BIKTARVY 50-200-25 MG PO TABS: 1.0000 | ORAL_TABLET | Freq: Every day | ORAL | 11 refills | Status: DC

## 2019-11-15 NOTE — Progress Notes (Addendum)
Subjective:  Chief complaint follow-up for HIV disease on medications  Patient ID: Molly Dillon, female    DOB: [birth year], 59 y.o.   MRN: [id number]  HPI  Molly Dillon is a 59 year old African-American lady living with HIV that is been perfectly controlled on Biktarvy.  She does have comorbid hyperlipidemia, chronic kidney disease hypertension hypertensive nephropathy.  Hypertension hypertensive nephropathy.    She seemed concerned about potential nephrotoxicity of Biktarvy.  I went over the differences between TAF and TDF and also her treatment history.  I would feel more comfortable currently continue her on a 3 drug regimen that does include TAF since she has had HIV infection since the 1990s.  I am unsure if she ever harbored resistance.   She is a candidate for the [place] buddy and seemed interested Lattie Haw came in and talk to her about the study.     Past Medical History:  Diagnosis Date  . Acute renal insufficiency 06/06/2014  . Arthritis   . Asthma   . Constipation 07/19/2014  . COPD (chronic obstructive pulmonary disease) with acute bronchitis ([place]) 06/06/2014  . Depression   . Depression, major, recurrent, moderate ([place]) 04/02/2015  . Dyspnea   . HIV infection ([place])   . Hyperlipidemia   . Hypertension   . Hypertensive nephropathy 07/19/2014  . On home oxygen therapy    2L  . Sleep apnea   . Unintentional weight loss 02/13/2015    Past Surgical History:  Procedure Laterality Date  . BIOPSY  09/21/2019   Procedure: BIOPSY;  Surgeon: Irving Copas., MD;  Location: [facility];  Service: Gastroenterology;;  . COLONOSCOPY WITH PROPOFOL N/A 09/21/2019   Procedure: COLONOSCOPY WITH PROPOFOL;  Surgeon: Irving Copas., MD;  Location: [facility];  Service: Gastroenterology;  Laterality: N/A;  . ESOPHAGOGASTRODUODENOSCOPY (EGD) WITH PROPOFOL N/A 09/21/2019   Procedure: ESOPHAGOGASTRODUODENOSCOPY (EGD) WITH PROPOFOL;  Surgeon: Rush Landmark Telford Nab., MD;  Location: [facility]  END[facility];  Service: Gastroenterology;  Laterality: N/A;    Family History  Problem Relation Age of Onset  . Diabetes Mother   . Healthy Father   . Diabetes Sister   . Diabetes Sister   . Pancreatic cancer Paternal Grandmother 38  . Colon cancer Neg Hx   . Stomach cancer Neg Hx   . Esophageal cancer Neg Hx   . Inflammatory bowel disease Neg Hx   . Liver disease Neg Hx   . Rectal cancer Neg Hx       Social History   Socioeconomic History  . Marital status: Married    Spouse name: Not on file  . Number of children: Not on file  . Years of education: Not on file  . Highest education level: Not on file  Occupational History  . Not on file  Tobacco Use  . Smoking status: Former Smoker    Packs/day: 0.50    Years: 36.00    Pack years: 18.00    Types: Cigarettes    Start date: 01/13/1980    Quit date: 01/03/2018    Years since quitting: 1.8  . Smokeless tobacco: Never Used  . Tobacco comment: down to 5 cigs a day  Vaping Use  . Vaping Use: Never used  Substance and Sexual Activity  . Alcohol use: No    Alcohol/week: 0.0 standard drinks  . Drug use: No  . Sexual activity: Not Currently    Partners: Male    Birth control/protection: None    Comment: given condoms  Other Topics Concern  . Not  on file  Social History Narrative  . Not on file   Social Determinants of Health   Financial Resource Strain:   . Difficulty of Paying Living Expenses: Not on file  Food Insecurity:   . Worried About Programme researcher, broadcasting/film/video in the Last Year: Not on file  . Ran Out of Food in the Last Year: Not on file  Transportation Needs:   . Lack of Transportation (Medical): Not on file  . Lack of Transportation (Non-Medical): Not on file  Physical Activity:   . Days of Exercise per Week: Not on file  . Minutes of Exercise per Session: Not on file  Stress:   . Feeling of Stress : Not on file  Social Connections:   . Frequency of Communication with Friends and Family: Not on file  .  Frequency of Social Gatherings with Friends and Family: Not on file  . Attends Religious Services: Not on file  . Active Member of Clubs or Organizations: Not on file  . Attends Banker Meetings: Not on file  . Marital Status: Not on file    Allergies  Allergen Reactions  . Dapsone Shortness Of Breath  . Penicillins Anaphylaxis    Has patient had a PCN reaction causing immediate rash, facial/tongue/throat swelling, SOB or lightheadedness with hypotension: Yes Has patient had a PCN reaction causing severe rash involving mucus membranes or skin necrosis: Yes Has patient had a PCN reaction that required hospitalization: No Has patient had a PCN reaction occurring within the last 10 years: No If all of the above answers are "NO", then may proceed with Cephalosporin use.      Current Outpatient Medications:  .  albuterol (VENTOLIN HFA) 108 (90 Base) MCG/ACT inhaler, Inhale 2 puffs into the lungs every 6 (six) hours as needed for wheezing., Disp: 18 g, Rfl: 11 .  amLODipine (NORVASC) 10 MG tablet, TAKE 1 TABLET BY MOUTH ONCE A DAY (Patient taking differently: Take 10 mg by mouth daily. ), Disp: 30 tablet, Rfl: 6 .  BIKTARVY 50-200-25 MG TABS tablet, TAKE 1 TABLET BY MOUTH DAILY., Disp: 30 tablet, Rfl: 6 .  cholecalciferol (QC VITAMIN D3) 10 MCG (400 UNIT) TABS tablet, Take 400 Units by mouth in the morning and at bedtime., Disp: , Rfl:  .  DULERA 200-5 MCG/ACT AERO, INHALE 2 PUFFS INTO THE LUNGS TWICE DAILY, Disp: 13 g, Rfl: 5 .  DULoxetine (CYMBALTA) 30 MG capsule, Take 2 capsules (60 mg total) by mouth daily., Disp: 60 capsule, Rfl: 1 .  fluorometholone (FML) 0.1 % ophthalmic suspension, Place 1 drop into both eyes in the morning and at bedtime. , Disp: , Rfl:  .  LINZESS 145 MCG CAPS capsule, Take 145 mcg by mouth daily as needed (constipation). , Disp: , Rfl:  .  methocarbamol (ROBAXIN) 750 MG tablet, Take 750 mg by mouth 3 (three) times daily as needed for muscle spasms. ,  Disp: , Rfl:  .  NARCAN 4 MG/0.1ML LIQD nasal spray kit, Place 1 spray into the nose as needed (accidental overdose). , Disp: , Rfl:  .  nystatin cream (MYCOSTATIN), Apply 1 application topically 2 (two) times daily. , Disp: , Rfl:  .  omeprazole (PRILOSEC) 20 MG capsule, Take 2 capsules (40 mg total) by mouth 2 (two) times daily before a meal., Disp: 60 capsule, Rfl: 4 .  oxyCODONE-acetaminophen (PERCOCET) 10-325 MG tablet, Take 1 tablet by mouth every 4 (four) hours as needed for pain., Disp: 30 tablet, Rfl: 0 .  pravastatin (PRAVACHOL) 20 MG tablet, TAKE 1 TABLET (20 MG TOTAL) BY MOUTH DAILY (Patient taking differently: Take 20 mg by mouth daily. ), Disp: 30 tablet, Rfl: 6  Current Facility-Administered Medications:  .  fluticasone furoate-vilanterol (BREO ELLIPTA) 100-25 MCG/INH 1 puff, 1 puff, Inhalation, Daily, Autry-Lott, Simone, DO    Review of Systems  Constitutional: Negative for activity change, appetite change, chills, diaphoresis, fatigue, fever and unexpected weight change.  HENT: Negative for congestion, rhinorrhea, sinus pressure, sneezing, sore throat and trouble swallowing.   Eyes: Negative for photophobia and visual disturbance.  Respiratory: Negative for cough, chest tightness, shortness of breath, wheezing and stridor.   Cardiovascular: Negative for chest pain, palpitations and leg swelling.  Gastrointestinal: Negative for abdominal distention, abdominal pain, anal bleeding, blood in stool, constipation, diarrhea, nausea and vomiting.  Genitourinary: Negative for difficulty urinating, dysuria, flank pain and hematuria.  Musculoskeletal: Negative for arthralgias, back pain, gait problem, joint swelling and myalgias.  Skin: Negative for color change, pallor, rash and wound.  Neurological: Negative for dizziness, tremors, weakness and light-headedness.  Hematological: Negative for adenopathy. Does not bruise/bleed easily.  Psychiatric/Behavioral: Negative for agitation,  behavioral problems, confusion, decreased concentration, dysphoric mood and sleep disturbance.       Objective:   Physical Exam Constitutional:      General: She is not in acute distress.    Appearance: Normal appearance. She is well-developed. She is not ill-appearing or diaphoretic.  HENT:     Head: Normocephalic and atraumatic.     Right Ear: Hearing and external ear normal.     Left Ear: Hearing and external ear normal.     Nose: No nasal deformity or rhinorrhea.  Eyes:     General: No scleral icterus.    Conjunctiva/sclera: Conjunctivae normal.     Right eye: Right conjunctiva is not injected.     Left eye: Left conjunctiva is not injected.     Pupils: Pupils are equal, round, and reactive to light.  Neck:     Vascular: No JVD.  Cardiovascular:     Rate and Rhythm: Normal rate and regular rhythm.     Heart sounds: Normal heart sounds, S1 normal and S2 normal. No murmur heard.  No friction rub.  Pulmonary:     Effort: Pulmonary effort is normal. No respiratory distress.  Abdominal:     General: There is no distension.  Musculoskeletal:        General: Normal range of motion.     Right shoulder: Normal.     Left shoulder: Normal.     Cervical back: Normal range of motion and neck supple.     Right hip: Normal.     Left hip: Normal.     Right knee: Normal.     Left knee: Normal.  Lymphadenopathy:     Head:     Right side of head: No submandibular, preauricular or posterior auricular adenopathy.     Left side of head: No submandibular, preauricular or posterior auricular adenopathy.     Cervical: No cervical adenopathy.     Right cervical: No superficial or deep cervical adenopathy.    Left cervical: No superficial or deep cervical adenopathy.  Skin:    General: Skin is warm and dry.     Coloration: Skin is not pale.     Findings: No abrasion, bruising, ecchymosis, erythema, lesion or rash.     Nails: There is no clubbing.  Neurological:     General: No focal  deficit present.  Mental Status: She is alert and oriented to person, place, and time.     Sensory: No sensory deficit.     Coordination: Coordination normal.     Gait: Gait normal.  Psychiatric:        Attention and Perception: She is attentive.        Mood and Affect: Mood normal.        Speech: Speech normal.        Behavior: Behavior normal. Behavior is cooperative.        Thought Content: Thought content normal.        Judgment: Judgment normal.           Assessment & Plan:  HIV disease continue Biktarvy.  Hypertension hypertensive nephropathy chronic kidney disease, COPD: Follow-up with PCP.  Lack of hepatitis B immunity would like to enroll her into the ACTG study.

## 2019-11-27 MED FILL — BIKTARVY 50-200-25 MG TABS: 50-200-25 | 30 days supply | Qty: 30 | Fill #0

## 2019-11-29 MED FILL — PRAVASTATIN SODIUM 20 MG TA: 20 | 30 days supply | Qty: 30 | Fill #6

## 2019-11-29 MED FILL — DULoxetine HCL 30 MG CPEP: 30 | 30 days supply | Qty: 60 | Fill #1

## 2019-11-29 MED FILL — AMLODIPINE BESYLATE 10 MG T: 10 | 30 days supply | Qty: 30 | Fill #6

## 2019-11-30 ENCOUNTER — Other Ambulatory Visit (HOSPITAL_COMMUNITY): Payer: Self-pay | Admitting: Registered Nurse

## 2019-11-30 DIAGNOSIS — I129 Hypertensive chronic kidney disease with stage 1 through stage 4 chronic kidney disease, or unspecified chronic kidney disease: Secondary | ICD-10-CM

## 2019-12-18 ENCOUNTER — Encounter (HOSPITAL_COMMUNITY): Payer: Self-pay | Admitting: Registered Nurse

## 2019-12-27 ENCOUNTER — Other Ambulatory Visit: Payer: Self-pay | Admitting: Registered Nurse

## 2019-12-27 ENCOUNTER — Other Ambulatory Visit: Payer: Self-pay | Admitting: Infectious Disease

## 2019-12-27 ENCOUNTER — Other Ambulatory Visit: Payer: Self-pay | Admitting: Family Medicine

## 2019-12-27 DIAGNOSIS — I10 Essential (primary) hypertension: Secondary | ICD-10-CM

## 2019-12-27 DIAGNOSIS — M545 Low back pain, unspecified: Secondary | ICD-10-CM

## 2019-12-27 DIAGNOSIS — E7849 Other hyperlipidemia: Secondary | ICD-10-CM

## 2019-12-27 MED FILL — AMLODIPINE BESYLATE 10 MG T: 10 | 30 days supply | Qty: 30 | Fill #0

## 2019-12-27 MED FILL — PRAVASTATIN SODIUM 20 MG TA: 20 | 30 days supply | Qty: 30 | Fill #0

## 2019-12-28 ENCOUNTER — Other Ambulatory Visit: Payer: Self-pay | Admitting: Registered Nurse

## 2019-12-28 ENCOUNTER — Other Ambulatory Visit: Payer: Self-pay | Admitting: Family Medicine

## 2019-12-28 ENCOUNTER — Telehealth (HOSPITAL_COMMUNITY): Payer: Self-pay | Admitting: Registered Nurse

## 2019-12-28 ENCOUNTER — Other Ambulatory Visit: Payer: Self-pay | Admitting: Internal Medicine

## 2019-12-28 DIAGNOSIS — R5381 Other malaise: Secondary | ICD-10-CM

## 2019-12-28 MED FILL — DULoxetine HCL 30 MG CPEP: 30 | 30 days supply | Qty: 60 | Fill #0

## 2019-12-28 NOTE — Telephone Encounter (Signed)
Just an FYI. We have made several attempts to contact this patient including sending a letter to schedule or reschedule their echocardiogram. We will be removing the patient from the echo [place].   12/18/19 MAILED LETTER [facility]  12/13/19 [facility] to schedule @ 1:59/[facility] 12/12/19 Call couldnot be completed at this time @ 12:04/[facility] 12/04/19 [facility] to schedue @ 10:43/[facility] 11/30/2019 [facility] to schedule @ 10:44/[facility]    Thank you

## 2020-01-01 MED FILL — BIKTARVY 50-200-25 MG TABS: 50-200-25 | 30 days supply | Qty: 30 | Fill #1

## 2020-01-19 ENCOUNTER — Telehealth: Payer: Self-pay | Admitting: Family Medicine

## 2020-01-19 NOTE — Telephone Encounter (Signed)
Patient dropped off forms to be completed by doctor for [employer], Last DOS: 05/30/2019 Patient would like papers faxed when completed to Fax# : [phone]. ROI is scanned into documents. Placing in [employer]. Thanks

## 2020-01-22 NOTE — Telephone Encounter (Signed)
Reviewed form and placed in PCP's box for completion.  .Ilyanna Baillargeon R Madelena Maturin, CMA  

## 2020-01-25 NOTE — Telephone Encounter (Signed)
Discussed form with patient. Form completed an placed in RN triage box.   Jefferson Fullam Autry-Lott, DO 01/25/2020, 10:49 PM PGY-2, [place]

## 2020-01-26 NOTE — Telephone Encounter (Signed)
Forms faxed to number provided.   Copy was made for batch scanning.  

## 2020-01-29 MED FILL — AMLODIPINE BESYLATE 10 MG T: 10 | 30 days supply | Qty: 30 | Fill #1

## 2020-01-29 MED FILL — DULoxetine HCL 30 MG CPEP: 30 | 30 days supply | Qty: 60 | Fill #1

## 2020-01-29 MED FILL — PRAVASTATIN SODIUM 20 MG TA: 20 | 30 days supply | Qty: 30 | Fill #1

## 2020-01-29 MED FILL — BIKTARVY 50-200-25 MG TABS: 50-200-25 | 30 days supply | Qty: 30 | Fill #2

## 2020-02-02 ENCOUNTER — Other Ambulatory Visit: Payer: Self-pay | Admitting: Registered Nurse

## 2020-02-02 DIAGNOSIS — E2839 Other primary ovarian failure: Secondary | ICD-10-CM

## 2020-02-21 ENCOUNTER — Other Ambulatory Visit: Payer: Self-pay | Admitting: Family Medicine

## 2020-02-21 DIAGNOSIS — M545 Low back pain, unspecified: Secondary | ICD-10-CM

## 2020-02-21 MED FILL — DULoxetine HCL 30 MG CPEP: 30 | 30 days supply | Qty: 60 | Fill #0

## 2020-02-29 MED FILL — PRAVASTATIN SODIUM 20 MG TA: 20 | 30 days supply | Qty: 30 | Fill #2

## 2020-02-29 MED FILL — BIKTARVY 50-200-25 MG TABS: 50-200-25 | 30 days supply | Qty: 30 | Fill #3

## 2020-02-29 MED FILL — AMLODIPINE BESYLATE 10 MG T: 10 | 30 days supply | Qty: 30 | Fill #2

## 2020-03-25 ENCOUNTER — Ambulatory Visit (INDEPENDENT_AMBULATORY_CARE_PROVIDER_SITE_OTHER): Payer: Medicare Other | Admitting: Podiatry

## 2020-03-25 ENCOUNTER — Other Ambulatory Visit: Payer: Self-pay

## 2020-03-25 ENCOUNTER — Encounter: Payer: Self-pay | Admitting: Gastroenterology

## 2020-03-25 DIAGNOSIS — M79674 Pain in right toe(s): Secondary | ICD-10-CM

## 2020-03-25 DIAGNOSIS — B070 Plantar wart: Secondary | ICD-10-CM

## 2020-03-25 DIAGNOSIS — B351 Tinea unguium: Secondary | ICD-10-CM

## 2020-03-25 DIAGNOSIS — M79675 Pain in left toe(s): Secondary | ICD-10-CM | POA: Diagnosis not present

## 2020-04-04 MED FILL — DULoxetine HCL 30 MG CPEP: 30 | 30 days supply | Qty: 60 | Fill #1

## 2020-04-04 MED FILL — PRAVASTATIN SODIUM 20 MG TA: 20 | 30 days supply | Qty: 30 | Fill #3

## 2020-04-04 MED FILL — AMLODIPINE BESYLATE 10 MG T: 10 | 30 days supply | Qty: 30 | Fill #3

## 2020-04-04 MED FILL — BIKTARVY 50-200-25 MG TABS: 50-200-25 | 30 days supply | Qty: 30 | Fill #4

## 2020-04-11 ENCOUNTER — Other Ambulatory Visit (HOSPITAL_COMMUNITY): Payer: Self-pay

## 2020-04-16 NOTE — Progress Notes (Signed)
   Subjective: 60 y.o. female established patient presenting today with a new complaint regarding symptomatic calluses/skin lesions to the bilateral feet.  She states that they are very painful to walk on.  They have been developing over the past few months.  She is also requesting a nail trim today.  She is unable to trim her nails and they are elongated and thickened and painful with shoe gear.  She presents for further treatment evaluation    Past Medical History:  Diagnosis Date  . Acute renal insufficiency 06/06/2014  . Arthritis   . Asthma   . Constipation 07/19/2014  . COPD (chronic obstructive pulmonary disease) with acute bronchitis ([place]) 06/06/2014  . Depression   . Depression, major, recurrent, moderate ([place]) 04/02/2015  . Dyspnea   . HIV infection ([place])   . Hyperlipidemia   . Hypertension   . Hypertensive nephropathy 07/19/2014  . On home oxygen therapy    2L  . Sleep apnea   . Unintentional weight loss 02/13/2015    Objective: Physical Exam General: The patient is alert and oriented x3 in no acute distress.   Dermatology: Hyperkeratotic skin lesions noted to the plantar aspect of the bilateral feet approximately 1 cm in diameter. Pinpoint bleeding noted upon debridement. Skin is warm, dry and supple bilateral lower extremities. Negative for open lesions or macerations.  Hyperkeratotic dystrophic elongated nails noted 1-5 bilateral with associated tenderness to palpation   Vascular: Palpable pedal pulses bilaterally. No edema or erythema noted. Capillary refill within normal limits.   Neurological: Epicritic and protective threshold grossly intact bilaterally.    Musculoskeletal Exam: Pain on palpation to the noted skin lesions.  Range of motion within normal limits to all pedal and ankle joints bilateral. Muscle strength 5/5 in all groups bilateral.    Assessment: 1. plantar wart bilateral feet 2.  Pain due to onychomycosis of toenails bilateral    Plan of Care:  1.  Patient was evaluated. 2. Excisional debridement of the plantar wart lesion(s) was performed using a chisel blade.  Salicylic acid was applied and the lesion(s) was dressed with a dry sterile dressing. 3.  Mechanical debridement of nails 1-5 bilateral was performed using a nail nipper without incident or bleeding  4.  Patient is to return to clinic in 4 weeks  Edrick Kins, DPM [facility]  Dr. Edrick Kins, DPM    [address], [place] [address]                Office [phone]  Fax [phone]

## 2020-05-01 ENCOUNTER — Other Ambulatory Visit (HOSPITAL_COMMUNITY): Payer: Self-pay

## 2020-05-01 ENCOUNTER — Other Ambulatory Visit: Payer: Self-pay | Admitting: Infectious Disease

## 2020-05-01 ENCOUNTER — Other Ambulatory Visit: Payer: Self-pay | Admitting: Family Medicine

## 2020-05-01 DIAGNOSIS — I10 Essential (primary) hypertension: Secondary | ICD-10-CM

## 2020-05-01 DIAGNOSIS — M545 Low back pain, unspecified: Secondary | ICD-10-CM

## 2020-05-01 DIAGNOSIS — E7849 Other hyperlipidemia: Secondary | ICD-10-CM

## 2020-05-01 MED ORDER — AMLODIPINE BESYLATE 10 MG PO TABS
ORAL_TABLET | Freq: Every day | ORAL | 0 refills | Status: DC
  Filled 2020-05-01: qty 30, 30d supply, fill #0

## 2020-05-01 MED ORDER — PRAVASTATIN SODIUM 20 MG PO TABS
ORAL_TABLET | Freq: Every day | ORAL | 0 refills | Status: DC
  Filled 2020-05-01: qty 30, 30d supply, fill #0

## 2020-05-01 MED FILL — Bictegravir-Emtricitabine-Tenofovir AF Tab 50-200-25 MG: ORAL | 30 days supply | Qty: 30 | Fill #0 | Status: AC

## 2020-05-02 ENCOUNTER — Other Ambulatory Visit (HOSPITAL_COMMUNITY): Payer: Self-pay

## 2020-05-02 MED ORDER — DULOXETINE HCL 30 MG PO CPEP
ORAL_CAPSULE | Freq: Every day | ORAL | 1 refills | Status: DC
  Filled 2020-05-02: qty 60, 30d supply, fill #0
  Filled 2020-06-03: qty 60, 30d supply, fill #1

## 2020-05-03 ENCOUNTER — Other Ambulatory Visit (HOSPITAL_COMMUNITY): Payer: Self-pay

## 2020-05-06 ENCOUNTER — Other Ambulatory Visit (HOSPITAL_COMMUNITY): Payer: Self-pay

## 2020-05-14 ENCOUNTER — Other Ambulatory Visit: Payer: Medicare Other

## 2020-05-16 ENCOUNTER — Other Ambulatory Visit: Payer: Medicare Other

## 2020-05-16 ENCOUNTER — Other Ambulatory Visit: Payer: Self-pay

## 2020-05-16 DIAGNOSIS — B20 Human immunodeficiency virus [HIV] disease: Secondary | ICD-10-CM

## 2020-05-17 LAB — T-HELPER CELL (CD4) - (RCID CLINIC ONLY)
CD4 % Helper T Cell: 26 % — ABNORMAL LOW (ref 33–65)
CD4 T Cell Abs: 740 /uL (ref 400–1790)

## 2020-05-19 LAB — LIPID PANEL
Cholesterol: 147 mg/dL (ref <200)
HDL: 47 mg/dL — ABNORMAL LOW (ref >=50)
LDL Cholesterol (Calc): 79 mg/dL (calc)
Non-HDL Cholesterol (Calc): 100 mg/dL (calc) (ref <130)
Total CHOL/HDL Ratio: 3.1 (calc) (ref <5.0)
Triglycerides: 109 mg/dL (ref <150)

## 2020-05-19 LAB — CBC WITH DIFFERENTIAL/PLATELET
Absolute Monocytes: 601 cells/uL (ref 200–950)
Basophils Absolute: 69 cells/uL (ref 0–200)
Basophils Relative: 0.9 %
Eosinophils Absolute: 131 cells/uL (ref 15–500)
Eosinophils Relative: 1.7 %
HCT: 45.5 % — ABNORMAL HIGH (ref 35.0–45.0)
Hemoglobin: 15.3 g/dL (ref 11.7–15.5)
Lymphs Abs: 3018 cells/uL (ref 850–3900)
MCH: 31.4 pg (ref 27.0–33.0)
MCHC: 33.6 g/dL (ref 32.0–36.0)
MCV: 93.4 fL (ref 80.0–100.0)
MPV: 9.2 fL (ref 7.5–12.5)
Monocytes Relative: 7.8 %
Neutro Abs: 3881 cells/uL (ref 1500–7800)
Neutrophils Relative %: 50.4 %
Platelets: 310 10*3/uL (ref 140–400)
RBC: 4.87 10*6/uL (ref 3.80–5.10)
RDW: 13.4 % (ref 11.0–15.0)
Total Lymphocyte: 39.2 %
WBC: 7.7 10*3/uL (ref 3.8–10.8)

## 2020-05-19 LAB — COMPLETE METABOLIC PANEL WITH GFR
AG Ratio: 1.2 (calc) (ref 1.0–2.5)
ALT: 15 U/L (ref 6–29)
AST: 16 U/L (ref 10–35)
Albumin: 4 g/dL (ref 3.6–5.1)
Alkaline phosphatase (APISO): 171 U/L — ABNORMAL HIGH (ref 37–153)
BUN/Creatinine Ratio: 9 (calc) (ref 6–22)
BUN: 13 mg/dL (ref 7–25)
CO2: 27 mmol/L (ref 20–32)
Calcium: 9.3 mg/dL (ref 8.6–10.4)
Chloride: 103 mmol/L (ref 98–110)
Creat: 1.48 mg/dL — ABNORMAL HIGH (ref 0.50–1.05)
GFR, Est African American: 45 mL/min/{1.73_m2} — ABNORMAL LOW (ref >=60)
GFR, Est Non African American: 39 mL/min/{1.73_m2} — ABNORMAL LOW (ref >=60)
Globulin: 3.4 g/dL (calc) (ref 1.9–3.7)
Glucose, Bld: 111 mg/dL — ABNORMAL HIGH (ref 65–99)
Potassium: 4.3 mmol/L (ref 3.5–5.3)
Sodium: 139 mmol/L (ref 135–146)
Total Bilirubin: 0.4 mg/dL (ref 0.2–1.2)
Total Protein: 7.4 g/dL (ref 6.1–8.1)

## 2020-05-19 LAB — HIV-1 RNA QUANT-NO REFLEX-BLD
HIV 1 RNA Quant: <20 Copies/mL — ABNORMAL HIGH
HIV-1 RNA Quant, Log: <1.3 Log cps/mL — ABNORMAL HIGH

## 2020-05-19 LAB — RPR: RPR Ser Ql: NONREACTIVE

## 2020-05-29 ENCOUNTER — Other Ambulatory Visit: Payer: Self-pay | Admitting: Infectious Disease

## 2020-05-29 ENCOUNTER — Other Ambulatory Visit (HOSPITAL_COMMUNITY): Payer: Self-pay

## 2020-05-29 ENCOUNTER — Encounter: Payer: Self-pay | Admitting: Infectious Disease

## 2020-05-29 ENCOUNTER — Other Ambulatory Visit: Payer: Self-pay

## 2020-05-29 ENCOUNTER — Ambulatory Visit (INDEPENDENT_AMBULATORY_CARE_PROVIDER_SITE_OTHER): Payer: Medicare Other | Admitting: Infectious Disease

## 2020-05-29 VITALS — BP 123/75 | HR 86 | Temp 98.0°F | Wt 158.0 lb

## 2020-05-29 DIAGNOSIS — I10 Essential (primary) hypertension: Secondary | ICD-10-CM

## 2020-05-29 DIAGNOSIS — R634 Abnormal weight loss: Secondary | ICD-10-CM

## 2020-05-29 DIAGNOSIS — B20 Human immunodeficiency virus [HIV] disease: Secondary | ICD-10-CM

## 2020-05-29 DIAGNOSIS — R630 Anorexia: Secondary | ICD-10-CM

## 2020-05-29 DIAGNOSIS — N1832 Chronic kidney disease, stage 3b: Secondary | ICD-10-CM

## 2020-05-29 DIAGNOSIS — E782 Mixed hyperlipidemia: Secondary | ICD-10-CM | POA: Diagnosis not present

## 2020-05-29 DIAGNOSIS — E7849 Other hyperlipidemia: Secondary | ICD-10-CM

## 2020-05-29 MED FILL — Bictegravir-Emtricitabine-Tenofovir AF Tab 50-200-25 MG: ORAL | 30 days supply | Qty: 30 | Fill #1 | Status: AC

## 2020-05-29 NOTE — Progress Notes (Signed)
Subjective:  Chief complaint follow-up for HIV disease on medications, who has lost 10-15 pounds in last 2 months   Patient ID: Molly Dillon, female    DOB: March 04, 1960, 60 y.o.   MRN: [id number]  HPI  Molly Dillon is a 60 year old African-American lady living with HIV that is been perfectly controlled on Biktarvy.  She does have comorbid hyperlipidemia, chronic kidney disease hypertension hypertensive nephropathy.  Hypertension hypertensive nephropathy.   She has lost 15-20 pounds in past 2 months and she is bothered by this and she has poor appetite she cannot explain. She claims that she is up to date on her screening for colon breast cancer.  He is now interested in the hepatitis B study I had her meet with Lattie Haw again today.         Past Medical History:  Diagnosis Date  . Acute renal insufficiency 06/06/2014  . Arthritis   . Asthma   . Constipation 07/19/2014  . COPD (chronic obstructive pulmonary disease) with acute bronchitis ([place]) 06/06/2014  . Depression   . Depression, major, recurrent, moderate ([place]) 04/02/2015  . Dyspnea   . HIV infection ([place])   . Hyperlipidemia   . Hypertension   . Hypertensive nephropathy 07/19/2014  . On home oxygen therapy    2L  . Sleep apnea   . Unintentional weight loss 02/13/2015    Past Surgical History:  Procedure Laterality Date  . BIOPSY  09/21/2019   Procedure: BIOPSY;  Surgeon: Irving Copas., MD;  Location: [facility];  Service: Gastroenterology;;  . COLONOSCOPY WITH PROPOFOL N/A 09/21/2019   Procedure: COLONOSCOPY WITH PROPOFOL;  Surgeon: Irving Copas., MD;  Location: [facility];  Service: Gastroenterology;  Laterality: N/A;  . ESOPHAGOGASTRODUODENOSCOPY (EGD) WITH PROPOFOL N/A 09/21/2019   Procedure: ESOPHAGOGASTRODUODENOSCOPY (EGD) WITH PROPOFOL;  Surgeon: Rush Landmark Telford Nab., MD;  Location: [facility];  Service: Gastroenterology;  Laterality: N/A;    Family History  Problem Relation Age of Onset  .  Diabetes Mother   . Healthy Father   . Diabetes Sister   . Diabetes Sister   . Pancreatic cancer Paternal Grandmother 5  . Colon cancer Neg Hx   . Stomach cancer Neg Hx   . Esophageal cancer Neg Hx   . Inflammatory bowel disease Neg Hx   . Liver disease Neg Hx   . Rectal cancer Neg Hx       Social History   Socioeconomic History  . Marital status: Married    Spouse name: Not on file  . Number of children: Not on file  . Years of education: Not on file  . Highest education level: Not on file  Occupational History  . Not on file  Tobacco Use  . Smoking status: Former Smoker    Packs/day: 0.50    Years: 36.00    Pack years: 18.00    Types: Cigarettes    Start date: 01/13/1980    Quit date: 01/03/2018    Years since quitting: 2.4  . Smokeless tobacco: Never Used  . Tobacco comment: down to 5 cigs a day  Vaping Use  . Vaping Use: Never used  Substance and Sexual Activity  . Alcohol use: No    Alcohol/week: 0.0 standard drinks  . Drug use: No  . Sexual activity: Not Currently    Partners: Male    Birth control/protection: None    Comment: given condoms  Other Topics Concern  . Not on file  Social History Narrative  . Not on file  Social Determinants of Health   Financial Resource Strain: Not on file  Food Insecurity: Not on file  Transportation Needs: Not on file  Physical Activity: Not on file  Stress: Not on file  Social Connections: Not on file    Allergies  Allergen Reactions  . Dapsone Shortness Of Breath  . Penicillins Anaphylaxis    Has patient had a PCN reaction causing immediate rash, facial/tongue/throat swelling, SOB or lightheadedness with hypotension: Yes Has patient had a PCN reaction causing severe rash involving mucus membranes or skin necrosis: Yes Has patient had a PCN reaction that required hospitalization: No Has patient had a PCN reaction occurring within the last 10 years: No If all of the above answers are "NO", then may proceed  with Cephalosporin use.      Current Outpatient Medications:  .  albuterol (VENTOLIN HFA) 108 (90 Base) MCG/ACT inhaler, Inhale 2 puffs into the lungs every 6 (six) hours as needed for wheezing., Disp: 18 g, Rfl: 11 .  amLODipine (NORVASC) 10 MG tablet, TAKE 1 TABLET BY MOUTH ONCE DAILY, Disp: 30 tablet, Rfl: 0 .  bictegravir-emtricitabine-tenofovir AF (BIKTARVY) 50-200-25 MG TABS tablet, TAKE 1 TABLET BY MOUTH DAILY., Disp: 30 tablet, Rfl: 11 .  cholecalciferol (QC VITAMIN D3) 10 MCG (400 UNIT) TABS tablet, Take 400 Units by mouth in the morning and at bedtime., Disp: , Rfl:  .  DULERA 200-5 MCG/ACT AERO, INHALE 2 PUFFS INTO THE LUNGS TWICE DAILY, Disp: 13 g, Rfl: 5 .  DULoxetine (CYMBALTA) 30 MG capsule, TAKE 2 CAPSULES BY MOUTH ONCE A DAY, Disp: 60 capsule, Rfl: 1 .  fluorometholone (FML) 0.1 % ophthalmic suspension, Place 1 drop into both eyes in the morning and at bedtime. , Disp: , Rfl:  .  LINZESS 145 MCG CAPS capsule, Take 145 mcg by mouth daily as needed (constipation). , Disp: , Rfl:  .  methocarbamol (ROBAXIN) 750 MG tablet, Take 750 mg by mouth 3 (three) times daily as needed for muscle spasms. , Disp: , Rfl:  .  NARCAN 4 MG/0.1ML LIQD nasal spray kit, Place 1 spray into the nose as needed (accidental overdose). , Disp: , Rfl:  .  nystatin cream (MYCOSTATIN), Apply 1 application topically 2 (two) times daily. , Disp: , Rfl:  .  omeprazole (PRILOSEC) 20 MG capsule, Take 2 capsules (40 mg total) by mouth 2 (two) times daily before a meal., Disp: 60 capsule, Rfl: 4 .  oxyCODONE-acetaminophen (PERCOCET) 10-325 MG tablet, Take 1 tablet by mouth every 4 (four) hours as needed for pain., Disp: 30 tablet, Rfl: 0 .  pravastatin (PRAVACHOL) 20 MG tablet, TAKE 1 TABLET BY MOUTH ONCE DAILY, Disp: 30 tablet, Rfl: 0  Current Facility-Administered Medications:  .  fluticasone furoate-vilanterol (BREO ELLIPTA) 100-25 MCG/INH 1 puff, 1 puff, Inhalation, Daily, Autry-Lott, Simone, DO    Review of  Systems  Constitutional: Positive for unexpected weight change. Negative for activity change, appetite change, chills, diaphoresis, fatigue and fever.  HENT: Negative for congestion, rhinorrhea, sinus pressure, sneezing, sore throat and trouble swallowing.   Eyes: Negative for photophobia and visual disturbance.  Respiratory: Negative for cough, chest tightness, shortness of breath, wheezing and stridor.   Cardiovascular: Negative for chest pain, palpitations and leg swelling.  Gastrointestinal: Negative for abdominal distention, abdominal pain, anal bleeding, blood in stool, constipation, diarrhea, nausea and vomiting.  Genitourinary: Negative for difficulty urinating, dysuria, flank pain and hematuria.  Musculoskeletal: Negative for arthralgias, back pain, gait problem, joint swelling and myalgias.  Skin: Negative for  color change, pallor, rash and wound.  Neurological: Negative for dizziness, tremors, weakness and light-headedness.  Hematological: Negative for adenopathy. Does not bruise/bleed easily.  Psychiatric/Behavioral: Negative for agitation, behavioral problems, confusion, decreased concentration, dysphoric mood and sleep disturbance.       Objective:   Physical Exam Constitutional:      General: She is not in acute distress.    Appearance: Normal appearance. She is well-developed. She is not ill-appearing or diaphoretic.  HENT:     Head: Normocephalic and atraumatic.     Right Ear: Hearing and external ear normal.     Left Ear: Hearing and external ear normal.     Nose: No nasal deformity or rhinorrhea.  Eyes:     General: No scleral icterus.    Extraocular Movements: Extraocular movements intact.     Conjunctiva/sclera: Conjunctivae normal.     Right eye: Right conjunctiva is not injected.     Left eye: Left conjunctiva is not injected.     Pupils: Pupils are equal, round, and reactive to light.  Neck:     Vascular: No JVD.  Cardiovascular:     Rate and Rhythm: Normal  rate and regular rhythm.     Heart sounds: Normal heart sounds, S1 normal and S2 normal. No murmur heard. No friction rub.  Pulmonary:     Effort: Pulmonary effort is normal. No respiratory distress.  Abdominal:     General: There is no distension.  Musculoskeletal:        General: Normal range of motion.     Right shoulder: Normal.     Left shoulder: Normal.     Cervical back: Normal range of motion and neck supple.     Right hip: Normal.     Left hip: Normal.     Right knee: Normal.     Left knee: Normal.  Lymphadenopathy:     Head:     Right side of head: No submandibular, preauricular or posterior auricular adenopathy.     Left side of head: No submandibular, preauricular or posterior auricular adenopathy.     Cervical: No cervical adenopathy.     Right cervical: No superficial or deep cervical adenopathy.    Left cervical: No superficial or deep cervical adenopathy.  Skin:    General: Skin is warm and dry.     Coloration: Skin is not pale.     Findings: No abrasion, bruising, ecchymosis, erythema, lesion or rash.     Nails: There is no clubbing.  Neurological:     General: No focal deficit present.     Mental Status: She is alert and oriented to person, place, and time.     Sensory: No sensory deficit.     Coordination: Coordination normal.     Gait: Gait normal.  Psychiatric:        Attention and Perception: She is attentive.        Mood and Affect: Mood normal.        Speech: Speech normal.        Behavior: Behavior normal. Behavior is cooperative.        Thought Content: Thought content normal.        Judgment: Judgment normal.           Assessment & Plan:  HIV disease continue Biktarvy we talked about potentially doing Dovato but she has had a fairly extensive treatment history though she has never failed lower barrier to resistance drugs such as Atripla I still had some reticence.  Hypertension hypertensive nephropathy : Emphasize importance of  controlling blood pressure  Weight loss not sure what is causing this could it be depression?  She denies this.  She is going to see PCP in a couple of days.    COPD: Follow-up with PCP.   Lack of hepatitis B immunity hopefully weekly and enroll into the B EE HIV E study next week  I spent greater than 40 minutes with the patient including greater than 50% of time in face to face counsel of the patient, review of her records  and in coordination of her care.

## 2020-05-30 ENCOUNTER — Other Ambulatory Visit: Payer: Self-pay | Admitting: Family Medicine

## 2020-05-30 ENCOUNTER — Other Ambulatory Visit (HOSPITAL_COMMUNITY): Payer: Self-pay

## 2020-05-30 DIAGNOSIS — I10 Essential (primary) hypertension: Secondary | ICD-10-CM

## 2020-05-30 DIAGNOSIS — E7849 Other hyperlipidemia: Secondary | ICD-10-CM

## 2020-05-31 MED ORDER — PRAVASTATIN SODIUM 20 MG PO TABS
20.0000 mg | ORAL_TABLET | Freq: Every day | ORAL | 0 refills | Status: DC
  Filled 2020-05-31: qty 30, 30d supply, fill #0

## 2020-05-31 MED ORDER — AMLODIPINE BESYLATE 10 MG PO TABS
10.0000 mg | ORAL_TABLET | Freq: Every day | ORAL | 0 refills | Status: DC
  Filled 2020-05-31: qty 30, 30d supply, fill #0

## 2020-06-01 ENCOUNTER — Other Ambulatory Visit (HOSPITAL_COMMUNITY): Payer: Self-pay

## 2020-06-02 ENCOUNTER — Other Ambulatory Visit (HOSPITAL_COMMUNITY): Payer: Self-pay

## 2020-06-03 ENCOUNTER — Other Ambulatory Visit (HOSPITAL_COMMUNITY): Payer: Self-pay

## 2020-06-05 ENCOUNTER — Encounter: Payer: Medicare Other | Admitting: *Deleted

## 2020-06-24 ENCOUNTER — Other Ambulatory Visit: Payer: Self-pay | Admitting: Family Medicine

## 2020-06-24 ENCOUNTER — Other Ambulatory Visit (HOSPITAL_COMMUNITY): Payer: Self-pay

## 2020-06-24 DIAGNOSIS — I10 Essential (primary) hypertension: Secondary | ICD-10-CM

## 2020-06-24 DIAGNOSIS — M545 Low back pain, unspecified: Secondary | ICD-10-CM

## 2020-06-24 DIAGNOSIS — E7849 Other hyperlipidemia: Secondary | ICD-10-CM

## 2020-06-24 MED ORDER — AMLODIPINE BESYLATE 10 MG PO TABS
10.0000 mg | ORAL_TABLET | Freq: Every day | ORAL | 11 refills | Status: DC
  Filled 2020-07-01: qty 30, 30d supply, fill #0
  Filled 2020-07-30: qty 30, 30d supply, fill #1
  Filled 2020-08-29: qty 30, 30d supply, fill #2
  Filled 2020-09-30: qty 30, 30d supply, fill #3
  Filled 2020-10-24: qty 30, 30d supply, fill #4
  Filled 2020-12-06: qty 30, 30d supply, fill #5
  Filled 2021-01-07: qty 30, 30d supply, fill #6
  Filled 2021-01-27: qty 30, 30d supply, fill #7
  Filled 2021-02-28: qty 30, 30d supply, fill #8
  Filled 2021-04-07: qty 30, 30d supply, fill #9
  Filled 2021-06-10: qty 30, 30d supply, fill #10

## 2020-06-24 MED ORDER — DULOXETINE HCL 30 MG PO CPEP
ORAL_CAPSULE | Freq: Every day | ORAL | 1 refills | Status: DC
Start: 2020-06-24 — End: 2020-08-20
  Filled 2020-07-01: qty 60, 30d supply, fill #0
  Filled 2020-07-30: qty 60, 30d supply, fill #1

## 2020-06-24 MED ORDER — PRAVASTATIN SODIUM 20 MG PO TABS
20.0000 mg | ORAL_TABLET | Freq: Every day | ORAL | 0 refills | Status: DC
  Filled 2020-07-01: qty 30, 30d supply, fill #0

## 2020-06-24 MED FILL — Bictegravir-Emtricitabine-Tenofovir AF Tab 50-200-25 MG: ORAL | 30 days supply | Qty: 30 | Fill #2 | Status: AC

## 2020-06-26 ENCOUNTER — Other Ambulatory Visit (HOSPITAL_COMMUNITY): Payer: Self-pay

## 2020-07-01 ENCOUNTER — Other Ambulatory Visit: Payer: Self-pay

## 2020-07-01 ENCOUNTER — Ambulatory Visit (INDEPENDENT_AMBULATORY_CARE_PROVIDER_SITE_OTHER): Payer: Medicare Other | Admitting: Podiatry

## 2020-07-01 ENCOUNTER — Other Ambulatory Visit (HOSPITAL_COMMUNITY): Payer: Self-pay

## 2020-07-01 DIAGNOSIS — M79675 Pain in left toe(s): Secondary | ICD-10-CM

## 2020-07-01 DIAGNOSIS — M79674 Pain in right toe(s): Secondary | ICD-10-CM | POA: Diagnosis not present

## 2020-07-01 DIAGNOSIS — B351 Tinea unguium: Secondary | ICD-10-CM

## 2020-07-01 DIAGNOSIS — B070 Plantar wart: Secondary | ICD-10-CM | POA: Diagnosis not present

## 2020-07-01 NOTE — Progress Notes (Signed)
   Subjective: 60 y.o. female established patient presenting today for follow-up evaluation of recurrence of calluses/skin lesions to the bilateral feet.  She states that they are very painful to walk on.  They have been developing over the past few months.  She was last seen in the office on 03/25/2020.  She has been applying the salicylic acid as instructed.  She is also requesting a nail trim today.  She is unable to trim her nails and they are elongated and thickened and painful with shoe gear.  She presents for further treatment evaluation    Past Medical History:  Diagnosis Date   Acute renal insufficiency 06/06/2014   Arthritis    Asthma    Constipation 07/19/2014   COPD (chronic obstructive pulmonary disease) with acute bronchitis ([place]) 06/06/2014   Depression    Depression, major, recurrent, moderate ([place]) 04/02/2015   Dyspnea    HIV infection ([place])    Hyperlipidemia    Hypertension    Hypertensive nephropathy 07/19/2014   On home oxygen therapy    2L   Sleep apnea    Unintentional weight loss 02/13/2015    Objective: Physical Exam General: The patient is alert and oriented x3 in no acute distress.   Dermatology: Hyperkeratotic skin lesions noted to the plantar aspect of the bilateral feet approximately 1 cm in diameter. Pinpoint bleeding noted upon debridement. Skin is warm, dry and supple bilateral lower extremities. Negative for open lesions or macerations.  Hyperkeratotic dystrophic elongated nails noted 1-5 bilateral with associated tenderness to palpation   Vascular: Palpable pedal pulses bilaterally. No edema or erythema noted. Capillary refill within normal limits.   Neurological: Epicritic and protective threshold grossly intact bilaterally.    Musculoskeletal Exam: Pain on palpation to the noted skin lesions.  Range of motion within normal limits to all pedal and ankle joints bilateral. Muscle strength 5/5 in all groups bilateral.    Assessment: 1. plantar wart  bilateral feet 2.  Pain due to onychomycosis of toenails bilateral    Plan of Care:  1. Patient was evaluated. 2. Excisional debridement of the plantar wart lesion(s) was performed using a chisel blade.  Salicylic acid was applied and the lesion(s) was dressed with a dry sterile dressing. 3.  Mechanical debridement of nails 1-5 bilateral was performed using a nail nipper without incident or bleeding  4.  Patient is to return to clinic in 4 weeks  Edrick Kins, DPM [facility]  Dr. Edrick Kins, DPM    [address], [place] [address]                Office [phone]  Fax [phone]

## 2020-07-23 ENCOUNTER — Other Ambulatory Visit: Payer: Self-pay | Admitting: Family Medicine

## 2020-07-23 ENCOUNTER — Other Ambulatory Visit (HOSPITAL_COMMUNITY): Payer: Self-pay

## 2020-07-23 DIAGNOSIS — E7849 Other hyperlipidemia: Secondary | ICD-10-CM

## 2020-07-24 ENCOUNTER — Other Ambulatory Visit (HOSPITAL_COMMUNITY): Payer: Self-pay

## 2020-07-24 MED ORDER — PRAVASTATIN SODIUM 20 MG PO TABS
20.0000 mg | ORAL_TABLET | Freq: Every day | ORAL | 3 refills | Status: DC
  Filled 2020-07-24: qty 90, 90d supply, fill #0
  Filled 2020-10-24: qty 90, 90d supply, fill #1
  Filled 2021-01-27: qty 90, 90d supply, fill #2
  Filled 2021-04-07: qty 90, 90d supply, fill #3

## 2020-07-30 ENCOUNTER — Other Ambulatory Visit (HOSPITAL_COMMUNITY): Payer: Self-pay

## 2020-07-30 MED FILL — Bictegravir-Emtricitabine-Tenofovir AF Tab 50-200-25 MG: ORAL | 30 days supply | Qty: 30 | Fill #3 | Status: AC

## 2020-08-20 ENCOUNTER — Other Ambulatory Visit (HOSPITAL_COMMUNITY): Payer: Self-pay

## 2020-08-20 ENCOUNTER — Other Ambulatory Visit: Payer: Self-pay | Admitting: Family Medicine

## 2020-08-20 DIAGNOSIS — M545 Low back pain, unspecified: Secondary | ICD-10-CM

## 2020-08-21 MED ORDER — DULOXETINE HCL 30 MG PO CPEP
ORAL_CAPSULE | Freq: Every day | ORAL | 1 refills | Status: DC
  Filled 2020-08-29: qty 60, 30d supply, fill #0
  Filled 2020-09-30: qty 60, 30d supply, fill #1

## 2020-08-29 ENCOUNTER — Other Ambulatory Visit (HOSPITAL_COMMUNITY): Payer: Self-pay

## 2020-08-29 MED FILL — Bictegravir-Emtricitabine-Tenofovir AF Tab 50-200-25 MG: ORAL | 30 days supply | Qty: 30 | Fill #4 | Status: AC

## 2020-09-23 ENCOUNTER — Other Ambulatory Visit (HOSPITAL_COMMUNITY): Payer: Self-pay

## 2020-09-30 ENCOUNTER — Other Ambulatory Visit (HOSPITAL_COMMUNITY): Payer: Self-pay

## 2020-09-30 MED FILL — Bictegravir-Emtricitabine-Tenofovir AF Tab 50-200-25 MG: ORAL | 30 days supply | Qty: 30 | Fill #5 | Status: AC

## 2020-10-02 ENCOUNTER — Ambulatory Visit: Payer: Medicare Other | Admitting: Podiatry

## 2020-10-24 ENCOUNTER — Other Ambulatory Visit: Payer: Self-pay | Admitting: Family Medicine

## 2020-10-24 ENCOUNTER — Other Ambulatory Visit (HOSPITAL_COMMUNITY): Payer: Self-pay

## 2020-10-24 DIAGNOSIS — M545 Low back pain, unspecified: Secondary | ICD-10-CM

## 2020-10-24 MED FILL — Bictegravir-Emtricitabine-Tenofovir AF Tab 50-200-25 MG: ORAL | 30 days supply | Qty: 30 | Fill #6 | Status: AC

## 2020-10-25 ENCOUNTER — Other Ambulatory Visit (HOSPITAL_COMMUNITY): Payer: Self-pay

## 2020-10-25 MED ORDER — DULOXETINE HCL 30 MG PO CPEP
ORAL_CAPSULE | Freq: Every day | ORAL | 1 refills | Status: DC
  Filled 2020-10-25: qty 60, 30d supply, fill #0
  Filled 2020-12-06: qty 60, 30d supply, fill #1

## 2020-10-28 ENCOUNTER — Other Ambulatory Visit (HOSPITAL_COMMUNITY): Payer: Self-pay

## 2020-11-06 ENCOUNTER — Other Ambulatory Visit: Payer: Self-pay

## 2020-11-06 ENCOUNTER — Other Ambulatory Visit: Payer: Medicare Other

## 2020-11-06 DIAGNOSIS — B20 Human immunodeficiency virus [HIV] disease: Secondary | ICD-10-CM

## 2020-11-06 DIAGNOSIS — N1832 Chronic kidney disease, stage 3b: Secondary | ICD-10-CM

## 2020-11-07 LAB — T-HELPER CELL (CD4) - (RCID CLINIC ONLY)
CD4 % Helper T Cell: 23 % — ABNORMAL LOW (ref 33–65)
CD4 T Cell Abs: 707 /uL (ref 400–1790)

## 2020-11-08 LAB — CBC WITH DIFFERENTIAL/PLATELET
Absolute Monocytes: 449 cells/uL (ref 200–950)
Basophils Absolute: 47 cells/uL (ref 0–200)
Basophils Relative: 0.7 %
Eosinophils Absolute: 87 cells/uL (ref 15–500)
Eosinophils Relative: 1.3 %
HCT: 47.5 % — ABNORMAL HIGH (ref 35.0–45.0)
Hemoglobin: 16.2 g/dL — ABNORMAL HIGH (ref 11.7–15.5)
Lymphs Abs: 3290 cells/uL (ref 850–3900)
MCH: 32 pg (ref 27.0–33.0)
MCHC: 34.1 g/dL (ref 32.0–36.0)
MCV: 93.9 fL (ref 80.0–100.0)
MPV: 9.6 fL (ref 7.5–12.5)
Monocytes Relative: 6.7 %
Neutro Abs: 2827 cells/uL (ref 1500–7800)
Neutrophils Relative %: 42.2 %
Platelets: 329 10*3/uL (ref 140–400)
RBC: 5.06 10*6/uL (ref 3.80–5.10)
RDW: 13.9 % (ref 11.0–15.0)
Total Lymphocyte: 49.1 %
WBC: 6.7 10*3/uL (ref 3.8–10.8)

## 2020-11-08 LAB — HIV-1 RNA QUANT-NO REFLEX-BLD
HIV 1 RNA Quant: NOT DETECTED Copies/mL
HIV-1 RNA Quant, Log: NOT DETECTED Log cps/mL

## 2020-11-08 LAB — COMPLETE METABOLIC PANEL WITH GFR
AG Ratio: 1.3 (calc) (ref 1.0–2.5)
ALT: 14 U/L (ref 6–29)
AST: 16 U/L (ref 10–35)
Albumin: 4.3 g/dL (ref 3.6–5.1)
Alkaline phosphatase (APISO): 149 U/L (ref 37–153)
BUN/Creatinine Ratio: 12 (calc) (ref 6–22)
BUN: 16 mg/dL (ref 7–25)
CO2: 23 mmol/L (ref 20–32)
Calcium: 9.4 mg/dL (ref 8.6–10.4)
Chloride: 107 mmol/L (ref 98–110)
Creat: 1.36 mg/dL — ABNORMAL HIGH (ref 0.50–1.03)
Globulin: 3.4 g/dL (calc) (ref 1.9–3.7)
Glucose, Bld: 102 mg/dL — ABNORMAL HIGH (ref 65–99)
Potassium: 4.1 mmol/L (ref 3.5–5.3)
Sodium: 141 mmol/L (ref 135–146)
Total Bilirubin: 0.5 mg/dL (ref 0.2–1.2)
Total Protein: 7.7 g/dL (ref 6.1–8.1)
eGFR: 45 mL/min/{1.73_m2} — ABNORMAL LOW (ref >=60)

## 2020-11-08 LAB — RPR: RPR Ser Ql: NONREACTIVE

## 2020-11-16 ENCOUNTER — Other Ambulatory Visit: Payer: Self-pay | Admitting: Infectious Disease

## 2020-11-16 DIAGNOSIS — B20 Human immunodeficiency virus [HIV] disease: Secondary | ICD-10-CM

## 2020-11-18 ENCOUNTER — Other Ambulatory Visit (HOSPITAL_COMMUNITY): Payer: Self-pay

## 2020-11-18 NOTE — Telephone Encounter (Signed)
Appt 11/9

## 2020-11-20 ENCOUNTER — Encounter: Payer: Medicare Other | Admitting: Infectious Disease

## 2020-11-20 ENCOUNTER — Other Ambulatory Visit (HOSPITAL_COMMUNITY): Payer: Self-pay

## 2020-11-20 ENCOUNTER — Other Ambulatory Visit: Payer: Self-pay | Admitting: Infectious Disease

## 2020-11-20 DIAGNOSIS — B20 Human immunodeficiency virus [HIV] disease: Secondary | ICD-10-CM

## 2020-11-20 NOTE — Telephone Encounter (Signed)
Appt 11/9

## 2020-11-21 ENCOUNTER — Other Ambulatory Visit (HOSPITAL_COMMUNITY): Payer: Self-pay

## 2020-11-21 MED ORDER — BIKTARVY 50-200-25 MG PO TABS
1.0000 | ORAL_TABLET | Freq: Every day | ORAL | 0 refills | Status: DC
  Filled 2020-11-21: qty 30, 30d supply, fill #0

## 2020-11-25 ENCOUNTER — Other Ambulatory Visit (HOSPITAL_COMMUNITY): Payer: Self-pay

## 2020-12-06 ENCOUNTER — Other Ambulatory Visit (HOSPITAL_COMMUNITY): Payer: Self-pay

## 2020-12-18 ENCOUNTER — Other Ambulatory Visit: Payer: Self-pay

## 2020-12-18 ENCOUNTER — Encounter: Payer: Self-pay | Admitting: Infectious Disease

## 2020-12-18 ENCOUNTER — Ambulatory Visit (INDEPENDENT_AMBULATORY_CARE_PROVIDER_SITE_OTHER): Payer: Medicare Other | Admitting: Infectious Disease

## 2020-12-18 ENCOUNTER — Other Ambulatory Visit (HOSPITAL_COMMUNITY): Payer: Self-pay

## 2020-12-18 VITALS — BP 105/76 | HR 78 | Resp 16 | Ht 64.0 in | Wt 156.0 lb

## 2020-12-18 DIAGNOSIS — Z23 Encounter for immunization: Secondary | ICD-10-CM | POA: Diagnosis not present

## 2020-12-18 DIAGNOSIS — D751 Secondary polycythemia: Secondary | ICD-10-CM

## 2020-12-18 DIAGNOSIS — E782 Mixed hyperlipidemia: Secondary | ICD-10-CM

## 2020-12-18 DIAGNOSIS — N1832 Chronic kidney disease, stage 3b: Secondary | ICD-10-CM | POA: Diagnosis not present

## 2020-12-18 DIAGNOSIS — I10 Essential (primary) hypertension: Secondary | ICD-10-CM

## 2020-12-18 DIAGNOSIS — F17200 Nicotine dependence, unspecified, uncomplicated: Secondary | ICD-10-CM

## 2020-12-18 DIAGNOSIS — B20 Human immunodeficiency virus [HIV] disease: Secondary | ICD-10-CM | POA: Diagnosis not present

## 2020-12-18 HISTORY — DX: Nicotine dependence, unspecified, uncomplicated: F17.200

## 2020-12-18 HISTORY — DX: Secondary polycythemia: D75.1

## 2020-12-18 MED ORDER — BIKTARVY 50-200-25 MG PO TABS
1.0000 | ORAL_TABLET | Freq: Every day | ORAL | 11 refills | Status: DC
Start: 2020-12-18 — End: 2021-12-23
  Filled 2020-12-18 – 2021-01-07 (×3): qty 30, 30d supply, fill #0
  Filled 2021-01-27: qty 30, 30d supply, fill #1
  Filled 2021-02-28: qty 30, 30d supply, fill #2
  Filled 2021-03-28: qty 30, 30d supply, fill #3
  Filled 2021-04-29: qty 30, 30d supply, fill #4
  Filled 2021-05-29 – 2021-06-05 (×2): qty 30, 30d supply, fill #5
  Filled 2021-07-01: qty 30, 30d supply, fill #6
  Filled 2021-07-23: qty 30, 30d supply, fill #7
  Filled 2021-09-09: qty 30, 30d supply, fill #8
  Filled 2021-10-10: qty 30, 30d supply, fill #9
  Filled 2021-11-10: qty 30, 30d supply, fill #10

## 2020-12-18 NOTE — Progress Notes (Signed)
Subjective:  Chief complaint: Follow-up for HIV disease on medications  Patient ID: Molly Dillon, female    DOB: 01-Apr-1960, 60 y.o.   MRN: [id number]  HPI  Molly Dillon is a 60 year-old African-American lady living with HIV that is been perfectly controlled on Biktarvy.  She does have comorbid hyperlipidemia, chronic kidney disease hypertension hypertensive nephropathy.  .  He did have a period of significant weight loss in 2021 but seems to have regained much of that.  She remains perfectly suppressed on BIKTARVY    She does smoke and I suspect her polycythemia is secondary to that.  She tells me that she does snore when I asked her about this.  She has not been diagnosed with sleep apnea.  She does however suffer from significant insomnia.      Past Medical History:  Diagnosis Date   Acute renal insufficiency 06/06/2014   Arthritis    Asthma    Constipation 07/19/2014   COPD (chronic obstructive pulmonary disease) with acute bronchitis ([place]) 06/06/2014   Depression    Depression, major, recurrent, moderate ([place]) 04/02/2015   Dyspnea    HIV infection ([place])    Hyperlipidemia    Hypertension    Hypertensive nephropathy 07/19/2014   On home oxygen therapy    2L   Sleep apnea    Unintentional weight loss 02/13/2015    Past Surgical History:  Procedure Laterality Date   BIOPSY  09/21/2019   Procedure: BIOPSY;  Surgeon: Irving Copas., MD;  Location: [facility];  Service: Gastroenterology;;   COLONOSCOPY WITH PROPOFOL N/A 09/21/2019   Procedure: COLONOSCOPY WITH PROPOFOL;  Surgeon: Irving Copas., MD;  Location: [facility];  Service: Gastroenterology;  Laterality: N/A;   ESOPHAGOGASTRODUODENOSCOPY (EGD) WITH PROPOFOL N/A 09/21/2019   Procedure: ESOPHAGOGASTRODUODENOSCOPY (EGD) WITH PROPOFOL;  Surgeon: Rush Landmark Telford Nab., MD;  Location: [facility];  Service: Gastroenterology;  Laterality: N/A;    Family History  Problem Relation Age of Onset   Diabetes  Mother    Healthy Father    Diabetes Sister    Diabetes Sister    Pancreatic cancer Paternal Grandmother 16   Colon cancer Neg Hx    Stomach cancer Neg Hx    Esophageal cancer Neg Hx    Inflammatory bowel disease Neg Hx    Liver disease Neg Hx    Rectal cancer Neg Hx       Social History   Socioeconomic History   Marital status: Married    Spouse name: Not on file   Number of children: Not on file   Years of education: Not on file   Highest education level: Not on file  Occupational History   Not on file  Tobacco Use   Smoking status: Former    Packs/day: 0.50    Years: 36.00    Pack years: 18.00    Types: Cigarettes    Start date: 01/13/1980    Quit date: 01/03/2018    Years since quitting: 2.9   Smokeless tobacco: Never   Tobacco comments:    down to 5 cigs a day  Vaping Use   Vaping Use: Never used  Substance and Sexual Activity   Alcohol use: No    Alcohol/week: 0.0 standard drinks   Drug use: No   Sexual activity: Not Currently    Partners: Male    Birth control/protection: None    Comment: given condoms  Other Topics Concern   Not on file  Social History Narrative   Not on file  Social Determinants of Health   Financial Resource Strain: Not on file  Food Insecurity: Not on file  Transportation Needs: Not on file  Physical Activity: Not on file  Stress: Not on file  Social Connections: Not on file    Allergies  Allergen Reactions   Dapsone Shortness Of Breath   Penicillins Anaphylaxis    Has patient had a PCN reaction causing immediate rash, facial/tongue/throat swelling, SOB or lightheadedness with hypotension: Yes Has patient had a PCN reaction causing severe rash involving mucus membranes or skin necrosis: Yes Has patient had a PCN reaction that required hospitalization: No Has patient had a PCN reaction occurring within the last 10 years: No If all of the above answers are "NO", then may proceed with Cephalosporin use.      Current  Outpatient Medications:    albuterol (VENTOLIN HFA) 108 (90 Base) MCG/ACT inhaler, Inhale 2 puffs into the lungs every 6 (six) hours as needed for wheezing., Disp: 18 g, Rfl: 11   amLODipine (NORVASC) 10 MG tablet, Take 1 tablet (10 mg total) by mouth daily., Disp: 30 tablet, Rfl: 11   atorvastatin (LIPITOR) 40 MG tablet, Take 40 mg by mouth daily as needed., Disp: , Rfl:    bictegravir-emtricitabine-tenofovir AF (BIKTARVY) 50-200-25 MG TABS tablet, TAKE 1 TABLET BY MOUTH DAILY., Disp: 30 tablet, Rfl: 0   cholecalciferol (VITAMIN D3) 10 MCG (400 UNIT) TABS tablet, Take 400 Units by mouth in the morning and at bedtime., Disp: , Rfl:    DULERA 200-5 MCG/ACT AERO, INHALE 2 PUFFS INTO THE LUNGS TWICE DAILY, Disp: 13 g, Rfl: 5   DULoxetine (CYMBALTA) 30 MG capsule, TAKE 2 CAPSULES BY MOUTH ONCE A DAY, Disp: 60 capsule, Rfl: 1   fluorometholone (FML) 0.1 % ophthalmic suspension, Place 1 drop into both eyes in the morning and at bedtime. , Disp: , Rfl:    LINZESS 145 MCG CAPS capsule, Take 145 mcg by mouth daily as needed (constipation). , Disp: , Rfl:    methocarbamol (ROBAXIN) 750 MG tablet, Take 750 mg by mouth 3 (three) times daily as needed for muscle spasms. , Disp: , Rfl:    NARCAN 4 MG/0.1ML LIQD nasal spray kit, Place 1 spray into the nose as needed (accidental overdose). , Disp: , Rfl:    omeprazole (PRILOSEC) 20 MG capsule, Take 2 capsules (40 mg total) by mouth 2 (two) times daily before a meal., Disp: 60 capsule, Rfl: 4   oxyCODONE-acetaminophen (PERCOCET) 10-325 MG tablet, Take 1 tablet by mouth every 4 (four) hours as needed for pain., Disp: 30 tablet, Rfl: 0   pravastatin (PRAVACHOL) 20 MG tablet, Take 1 tablet (20 mg total) by mouth daily., Disp: 90 tablet, Rfl: 3   TRELEGY ELLIPTA 100-62.5-25 MCG/ACT AEPB, Take 1 puff by mouth daily., Disp: , Rfl:    nystatin cream (MYCOSTATIN), Apply 1 application topically 2 (two) times daily. , Disp: , Rfl:   Current Facility-Administered Medications:     fluticasone furoate-vilanterol (BREO ELLIPTA) 100-25 MCG/INH 1 puff, 1 puff, Inhalation, Daily, Autry-Lott, Simone, DO    Review of Systems  Constitutional:  Positive for fatigue. Negative for chills and fever.  HENT:  Negative for congestion and sore throat.   Eyes:  Negative for photophobia.  Respiratory:  Negative for cough, shortness of breath and wheezing.   Cardiovascular:  Negative for chest pain, palpitations and leg swelling.  Gastrointestinal:  Negative for abdominal pain, blood in stool, constipation, diarrhea, nausea and vomiting.  Genitourinary:  Negative for dysuria, flank  pain and hematuria.  Musculoskeletal:  Negative for back pain and myalgias.  Skin:  Negative for rash.  Neurological:  Negative for dizziness, weakness and headaches.  Hematological:  Does not bruise/bleed easily.  Psychiatric/Behavioral:  Positive for sleep disturbance. Negative for confusion, dysphoric mood, self-injury and suicidal ideas. The patient is not nervous/anxious and is not hyperactive.       Objective:   Physical Exam Constitutional:      General: She is not in acute distress.    Appearance: Normal appearance. She is well-developed. She is not ill-appearing or diaphoretic.  HENT:     Head: Normocephalic and atraumatic.     Right Ear: Hearing and external ear normal.     Left Ear: Hearing and external ear normal.     Nose: No nasal deformity or rhinorrhea.  Eyes:     General: No scleral icterus.    Conjunctiva/sclera: Conjunctivae normal.     Right eye: Right conjunctiva is not injected.     Left eye: Left conjunctiva is not injected.     Pupils: Pupils are equal, round, and reactive to light.  Neck:     Vascular: No JVD.  Cardiovascular:     Rate and Rhythm: Normal rate and regular rhythm.     Heart sounds: S1 normal and S2 normal.  Pulmonary:     Effort: Pulmonary effort is normal. No respiratory distress.     Breath sounds: No wheezing.  Abdominal:     General: Bowel  sounds are normal. There is no distension.     Palpations: Abdomen is soft.     Tenderness: There is no abdominal tenderness.  Musculoskeletal:        General: Normal range of motion.     Right shoulder: Normal.     Left shoulder: Normal.     Cervical back: Normal range of motion and neck supple.     Right hip: Normal.     Left hip: Normal.     Right knee: Normal.     Left knee: Normal.  Lymphadenopathy:     Head:     Right side of head: No submandibular, preauricular or posterior auricular adenopathy.     Left side of head: No submandibular, preauricular or posterior auricular adenopathy.     Cervical: No cervical adenopathy.     Right cervical: No superficial or deep cervical adenopathy.    Left cervical: No superficial or deep cervical adenopathy.  Skin:    General: Skin is warm and dry.     Coloration: Skin is not pale.     Findings: No abrasion, bruising, ecchymosis, erythema, lesion or rash.     Nails: There is no clubbing.  Neurological:     General: No focal deficit present.     Mental Status: She is alert and oriented to person, place, and time.     Sensory: No sensory deficit.     Coordination: Coordination normal.     Gait: Gait normal.  Psychiatric:        Attention and Perception: She is attentive.        Mood and Affect: Mood normal.        Speech: Speech normal.        Behavior: Behavior normal. Behavior is cooperative.        Thought Content: Thought content normal.        Judgment: Judgment normal.          Assessment & Plan:  HIV disease:  I reviewed her  viral load from November 06, 2020 and 22 which was not detected  Lab Results  Component Value Date   HIV1RNAQUANT Not Detected 11/06/2020    I reviewed her CD4 same date which was 707  Lab Results  Component Value Date   CD4TABS 707 11/06/2020   CD4TABS 740 05/16/2020   CD4TABS 488 10/05/2019   Continue her BIKTARVY which have sent to [facility]  Hypertension with  hypertensive nephropathy: Critical that she maintain excellent blood pressure control which she has done currently with a blood pressure 105/76 in clinic today.  Polycythemia: Suspect smoking is playing a contribution to this and's I have asked her to try to move completely away from conventional cigarettes to vaping if she needs to inhale nicotine, hopefully eventually getting off this carcinogen itself.  She might also benefit from sleep study.  Insomnia: I wonder prescribe trazodone but she seems to be on Cymbalta based on the [facility] and I do not want to risk serotonin syndrome  I will have our staff check if she is on cymbalta still and if not start trazadone, if she is will refer with [facility] pharmacy ?  Can prescribe this or not.

## 2020-12-25 ENCOUNTER — Other Ambulatory Visit (HOSPITAL_COMMUNITY): Payer: Self-pay

## 2021-01-02 ENCOUNTER — Other Ambulatory Visit (HOSPITAL_COMMUNITY): Payer: Self-pay

## 2021-01-07 ENCOUNTER — Other Ambulatory Visit (HOSPITAL_COMMUNITY): Payer: Self-pay

## 2021-01-07 ENCOUNTER — Other Ambulatory Visit: Payer: Self-pay | Admitting: Family Medicine

## 2021-01-07 DIAGNOSIS — M545 Low back pain, unspecified: Secondary | ICD-10-CM

## 2021-01-08 ENCOUNTER — Other Ambulatory Visit (HOSPITAL_COMMUNITY): Payer: Self-pay

## 2021-01-09 MED ORDER — DULOXETINE HCL 30 MG PO CPEP
ORAL_CAPSULE | Freq: Every day | ORAL | 1 refills | Status: DC
  Filled 2021-01-09: qty 60, 30d supply, fill #0
  Filled 2021-01-27: qty 60, 30d supply, fill #1

## 2021-01-10 ENCOUNTER — Other Ambulatory Visit (HOSPITAL_COMMUNITY): Payer: Self-pay

## 2021-01-21 ENCOUNTER — Other Ambulatory Visit (HOSPITAL_COMMUNITY): Payer: Self-pay

## 2021-01-27 ENCOUNTER — Other Ambulatory Visit (HOSPITAL_COMMUNITY): Payer: Self-pay

## 2021-02-05 ENCOUNTER — Other Ambulatory Visit (HOSPITAL_COMMUNITY): Payer: Self-pay

## 2021-02-28 ENCOUNTER — Other Ambulatory Visit (HOSPITAL_COMMUNITY): Payer: Self-pay

## 2021-02-28 ENCOUNTER — Other Ambulatory Visit: Payer: Self-pay | Admitting: Family Medicine

## 2021-02-28 DIAGNOSIS — M545 Low back pain, unspecified: Secondary | ICD-10-CM

## 2021-02-28 MED ORDER — DULOXETINE HCL 30 MG PO CPEP
ORAL_CAPSULE | Freq: Every day | ORAL | 1 refills | Status: DC
  Filled 2021-02-28: qty 60, 30d supply, fill #0
  Filled 2021-04-07: qty 60, 30d supply, fill #1

## 2021-03-06 ENCOUNTER — Other Ambulatory Visit (HOSPITAL_COMMUNITY): Payer: Self-pay

## 2021-03-19 ENCOUNTER — Other Ambulatory Visit: Payer: Self-pay | Admitting: Nurse Practitioner

## 2021-03-19 DIAGNOSIS — Z1231 Encounter for screening mammogram for malignant neoplasm of breast: Secondary | ICD-10-CM

## 2021-03-19 DIAGNOSIS — Z780 Asymptomatic menopausal state: Secondary | ICD-10-CM

## 2021-03-26 ENCOUNTER — Ambulatory Visit
Admission: RE | Admit: 2021-03-26 | Discharge: 2021-03-26 | Disposition: A | Discharge status: Home / Self Care | Payer: 59 | Source: Ambulatory Visit | Attending: Nurse Practitioner | Admitting: Nurse Practitioner

## 2021-03-26 ENCOUNTER — Other Ambulatory Visit: Payer: Self-pay

## 2021-03-26 DIAGNOSIS — Z1231 Encounter for screening mammogram for malignant neoplasm of breast: Secondary | ICD-10-CM

## 2021-03-28 ENCOUNTER — Other Ambulatory Visit (HOSPITAL_COMMUNITY): Payer: Self-pay

## 2021-04-07 ENCOUNTER — Other Ambulatory Visit (HOSPITAL_COMMUNITY): Payer: Self-pay

## 2021-04-14 ENCOUNTER — Other Ambulatory Visit (HOSPITAL_COMMUNITY): Payer: Self-pay

## 2021-04-29 ENCOUNTER — Other Ambulatory Visit (HOSPITAL_COMMUNITY): Payer: Self-pay

## 2021-04-30 ENCOUNTER — Ambulatory Visit: Payer: 59

## 2021-05-05 ENCOUNTER — Other Ambulatory Visit (HOSPITAL_COMMUNITY): Payer: Self-pay

## 2021-05-07 ENCOUNTER — Ambulatory Visit: Payer: 59

## 2021-05-29 ENCOUNTER — Other Ambulatory Visit (HOSPITAL_COMMUNITY): Payer: Self-pay

## 2021-05-29 ENCOUNTER — Other Ambulatory Visit: Payer: Self-pay | Admitting: Family Medicine

## 2021-05-29 DIAGNOSIS — M545 Low back pain, unspecified: Secondary | ICD-10-CM

## 2021-05-29 MED ORDER — DULOXETINE HCL 30 MG PO CPEP
ORAL_CAPSULE | Freq: Every day | ORAL | 1 refills | Status: DC
  Filled 2021-05-29: qty 60, 30d supply, fill #0
  Filled 2021-07-01 (×2): qty 60, 30d supply, fill #1

## 2021-06-05 ENCOUNTER — Other Ambulatory Visit (HOSPITAL_COMMUNITY): Payer: Self-pay

## 2021-06-10 ENCOUNTER — Other Ambulatory Visit: Payer: Self-pay | Admitting: Family Medicine

## 2021-06-10 ENCOUNTER — Other Ambulatory Visit (HOSPITAL_COMMUNITY): Payer: Self-pay

## 2021-06-10 DIAGNOSIS — E7849 Other hyperlipidemia: Secondary | ICD-10-CM

## 2021-06-10 MED ORDER — PRAVASTATIN SODIUM 20 MG PO TABS
20.0000 mg | ORAL_TABLET | Freq: Every day | ORAL | 3 refills | Status: DC
  Filled 2021-06-10 – 2021-07-04 (×2): qty 90, 90d supply, fill #0
  Filled 2021-09-09: qty 90, 90d supply, fill #1
  Filled 2022-01-22: qty 90, 90d supply, fill #2

## 2021-06-11 ENCOUNTER — Other Ambulatory Visit (HOSPITAL_COMMUNITY): Payer: Self-pay

## 2021-06-17 ENCOUNTER — Encounter: Payer: Self-pay | Admitting: *Deleted

## 2021-07-01 ENCOUNTER — Other Ambulatory Visit (HOSPITAL_COMMUNITY): Payer: Self-pay

## 2021-07-03 ENCOUNTER — Other Ambulatory Visit (HOSPITAL_COMMUNITY): Payer: Self-pay

## 2021-07-04 ENCOUNTER — Other Ambulatory Visit (HOSPITAL_COMMUNITY): Payer: Self-pay

## 2021-07-04 ENCOUNTER — Other Ambulatory Visit: Payer: Self-pay | Admitting: Family Medicine

## 2021-07-04 DIAGNOSIS — I10 Essential (primary) hypertension: Secondary | ICD-10-CM

## 2021-07-05 ENCOUNTER — Other Ambulatory Visit (HOSPITAL_COMMUNITY): Payer: Self-pay

## 2021-07-05 MED ORDER — AMLODIPINE BESYLATE 10 MG PO TABS
10.0000 mg | ORAL_TABLET | Freq: Every day | ORAL | 11 refills | Status: DC
  Filled 2021-07-05: qty 30, 30d supply, fill #0
  Filled 2021-07-23: qty 30, 30d supply, fill #1
  Filled 2021-09-09: qty 30, 30d supply, fill #2
  Filled 2021-10-10: qty 30, 30d supply, fill #3
  Filled 2021-11-10: qty 30, 30d supply, fill #4
  Filled 2021-12-23: qty 30, 30d supply, fill #5
  Filled 2022-01-22: qty 30, 30d supply, fill #6
  Filled 2022-02-20: qty 30, 30d supply, fill #7
  Filled 2022-03-20: qty 30, 30d supply, fill #8
  Filled 2022-04-30: qty 30, 30d supply, fill #9
  Filled 2022-06-22: qty 30, 30d supply, fill #10

## 2021-07-23 ENCOUNTER — Other Ambulatory Visit (HOSPITAL_COMMUNITY): Payer: Self-pay

## 2021-07-23 ENCOUNTER — Other Ambulatory Visit: Payer: Self-pay | Admitting: Family Medicine

## 2021-07-23 DIAGNOSIS — M545 Low back pain, unspecified: Secondary | ICD-10-CM

## 2021-07-24 ENCOUNTER — Other Ambulatory Visit (HOSPITAL_COMMUNITY): Payer: Self-pay

## 2021-07-24 MED ORDER — DULOXETINE HCL 30 MG PO CPEP
ORAL_CAPSULE | Freq: Every day | ORAL | 1 refills | Status: DC
  Filled 2021-08-05: qty 60, 30d supply, fill #0
  Filled 2021-09-09: qty 60, 30d supply, fill #1

## 2021-08-05 ENCOUNTER — Other Ambulatory Visit (HOSPITAL_COMMUNITY): Payer: Self-pay

## 2021-08-26 ENCOUNTER — Inpatient Hospital Stay: Admission: RE | Admit: 2021-08-26 | Payer: Medicare Other | Source: Ambulatory Visit

## 2021-09-09 ENCOUNTER — Other Ambulatory Visit (HOSPITAL_COMMUNITY): Payer: Self-pay

## 2021-09-10 ENCOUNTER — Other Ambulatory Visit (HOSPITAL_COMMUNITY): Payer: Self-pay

## 2021-09-26 ENCOUNTER — Other Ambulatory Visit: Payer: Self-pay | Admitting: Registered Nurse

## 2021-09-26 DIAGNOSIS — Z1231 Encounter for screening mammogram for malignant neoplasm of breast: Secondary | ICD-10-CM

## 2021-10-10 ENCOUNTER — Other Ambulatory Visit (HOSPITAL_COMMUNITY): Payer: Self-pay

## 2021-10-10 ENCOUNTER — Other Ambulatory Visit: Payer: Self-pay | Admitting: Student

## 2021-10-10 DIAGNOSIS — M545 Low back pain, unspecified: Secondary | ICD-10-CM

## 2021-10-10 MED ORDER — DULOXETINE HCL 30 MG PO CPEP
ORAL_CAPSULE | Freq: Every day | ORAL | 1 refills | Status: DC
  Filled 2021-10-17: qty 60, 30d supply, fill #0
  Filled 2021-11-10: qty 60, 30d supply, fill #1

## 2021-10-17 ENCOUNTER — Other Ambulatory Visit (HOSPITAL_COMMUNITY): Payer: Self-pay

## 2021-10-24 ENCOUNTER — Ambulatory Visit: Payer: 59

## 2021-11-10 ENCOUNTER — Other Ambulatory Visit (HOSPITAL_COMMUNITY): Payer: Self-pay

## 2021-11-18 ENCOUNTER — Other Ambulatory Visit (HOSPITAL_COMMUNITY): Payer: Self-pay

## 2021-11-20 ENCOUNTER — Other Ambulatory Visit (HOSPITAL_COMMUNITY): Payer: Self-pay

## 2021-12-02 ENCOUNTER — Other Ambulatory Visit (HOSPITAL_COMMUNITY): Payer: Self-pay

## 2021-12-05 ENCOUNTER — Other Ambulatory Visit (HOSPITAL_COMMUNITY): Payer: Self-pay

## 2021-12-09 ENCOUNTER — Other Ambulatory Visit (HOSPITAL_COMMUNITY): Payer: Self-pay

## 2021-12-15 ENCOUNTER — Other Ambulatory Visit (HOSPITAL_COMMUNITY): Payer: Self-pay

## 2021-12-23 ENCOUNTER — Other Ambulatory Visit: Payer: Self-pay | Admitting: Student

## 2021-12-23 ENCOUNTER — Other Ambulatory Visit (HOSPITAL_COMMUNITY): Payer: Self-pay

## 2021-12-23 ENCOUNTER — Other Ambulatory Visit: Payer: Self-pay | Admitting: Infectious Disease

## 2021-12-23 DIAGNOSIS — M545 Low back pain, unspecified: Secondary | ICD-10-CM

## 2021-12-23 DIAGNOSIS — B20 Human immunodeficiency virus [HIV] disease: Secondary | ICD-10-CM

## 2021-12-24 ENCOUNTER — Other Ambulatory Visit (HOSPITAL_COMMUNITY): Payer: Self-pay

## 2021-12-24 ENCOUNTER — Other Ambulatory Visit: Payer: Self-pay

## 2021-12-24 MED ORDER — DULOXETINE HCL 30 MG PO CPEP
60.0000 mg | ORAL_CAPSULE | Freq: Every day | ORAL | 1 refills | Status: DC
  Filled 2021-12-24: qty 60, 30d supply, fill #0
  Filled 2022-01-22: qty 60, 30d supply, fill #1

## 2021-12-24 NOTE — Telephone Encounter (Signed)
Left VM patient needs appointment

## 2021-12-26 ENCOUNTER — Other Ambulatory Visit (HOSPITAL_COMMUNITY): Payer: Self-pay

## 2021-12-26 ENCOUNTER — Other Ambulatory Visit: Payer: Self-pay

## 2021-12-26 MED ORDER — BIKTARVY 50-200-25 MG PO TABS
1.0000 | ORAL_TABLET | Freq: Every day | ORAL | 0 refills | Status: DC
  Filled 2021-12-26: qty 30, 30d supply, fill #0

## 2021-12-29 ENCOUNTER — Other Ambulatory Visit: Payer: Self-pay

## 2022-01-20 ENCOUNTER — Other Ambulatory Visit (HOSPITAL_COMMUNITY): Payer: Self-pay

## 2022-01-22 ENCOUNTER — Other Ambulatory Visit: Payer: Self-pay | Admitting: Infectious Disease

## 2022-01-22 ENCOUNTER — Other Ambulatory Visit (HOSPITAL_COMMUNITY): Payer: Self-pay

## 2022-01-22 ENCOUNTER — Other Ambulatory Visit: Payer: Self-pay

## 2022-01-22 DIAGNOSIS — B20 Human immunodeficiency virus [HIV] disease: Secondary | ICD-10-CM

## 2022-01-22 MED ORDER — BIKTARVY 50-200-25 MG PO TABS
1.0000 | ORAL_TABLET | Freq: Every day | ORAL | 0 refills | Status: DC
  Filled 2022-01-22: qty 30, 30d supply, fill #0

## 2022-01-24 ENCOUNTER — Other Ambulatory Visit: Payer: Self-pay

## 2022-02-02 ENCOUNTER — Encounter: Payer: Self-pay | Admitting: Infectious Disease

## 2022-02-02 ENCOUNTER — Other Ambulatory Visit (HOSPITAL_COMMUNITY): Payer: Self-pay

## 2022-02-02 ENCOUNTER — Other Ambulatory Visit: Payer: Self-pay

## 2022-02-02 ENCOUNTER — Ambulatory Visit (INDEPENDENT_AMBULATORY_CARE_PROVIDER_SITE_OTHER): Payer: 59 | Admitting: Infectious Disease

## 2022-02-02 ENCOUNTER — Ambulatory Visit (INDEPENDENT_AMBULATORY_CARE_PROVIDER_SITE_OTHER): Payer: 59

## 2022-02-02 VITALS — BP 112/79 | HR 110 | Temp 97.7°F | Wt 146.6 lb

## 2022-02-02 DIAGNOSIS — B20 Human immunodeficiency virus [HIV] disease: Secondary | ICD-10-CM

## 2022-02-02 DIAGNOSIS — I129 Hypertensive chronic kidney disease with stage 1 through stage 4 chronic kidney disease, or unspecified chronic kidney disease: Secondary | ICD-10-CM

## 2022-02-02 DIAGNOSIS — I10 Essential (primary) hypertension: Secondary | ICD-10-CM

## 2022-02-02 DIAGNOSIS — J441 Chronic obstructive pulmonary disease with (acute) exacerbation: Secondary | ICD-10-CM

## 2022-02-02 DIAGNOSIS — E7800 Pure hypercholesterolemia, unspecified: Secondary | ICD-10-CM

## 2022-02-02 DIAGNOSIS — N1832 Chronic kidney disease, stage 3b: Secondary | ICD-10-CM

## 2022-02-02 DIAGNOSIS — Z7185 Encounter for immunization safety counseling: Secondary | ICD-10-CM

## 2022-02-02 DIAGNOSIS — Z23 Encounter for immunization: Secondary | ICD-10-CM

## 2022-02-02 DIAGNOSIS — M87059 Idiopathic aseptic necrosis of unspecified femur: Secondary | ICD-10-CM | POA: Diagnosis not present

## 2022-02-02 DIAGNOSIS — Z87891 Personal history of nicotine dependence: Secondary | ICD-10-CM

## 2022-02-02 MED ORDER — BIKTARVY 50-200-25 MG PO TABS
1.0000 | ORAL_TABLET | Freq: Every day | ORAL | 11 refills | Status: DC
  Filled 2022-02-02 – 2022-02-20 (×2): qty 30, 30d supply, fill #0
  Filled 2022-03-20: qty 30, 30d supply, fill #1
  Filled 2022-06-22: qty 30, 30d supply, fill #2
  Filled 2022-08-06: qty 30, 30d supply, fill #3

## 2022-02-02 NOTE — Addendum Note (Signed)
Addended by: Caffie Pinto on: 02/02/2022 04:31 PM   Modules accepted: Orders

## 2022-02-02 NOTE — Progress Notes (Signed)
Subjective:  Chief complaint follow-up for HIV disease on medications  Patient ID: Molly Dillon, female    DOB: 26-Jan-1960, 62 y.o.   MRN: [id number]  HPI  Molly Dillon is a 62 year old black woman living with HIV and also comorbid COPD, asthma in the hist with a history of smoking.  Check she is stop smoking altogether x 4 months.   Past Medical History:  Diagnosis Date   Acute renal insufficiency 06/06/2014   Arthritis    Asthma    Constipation 07/19/2014   COPD (chronic obstructive pulmonary disease) with acute bronchitis ([place]) 06/06/2014   Depression    Depression, major, recurrent, moderate ([place]) 04/02/2015   Dyspnea    HIV infection ([place])    Hyperlipidemia    Hypertension    Hypertensive nephropathy 07/19/2014   On home oxygen therapy    2L   Polycythemia 12/18/2020   Sleep apnea    Smoker 12/18/2020   Unintentional weight loss 02/13/2015    Past Surgical History:  Procedure Laterality Date   BIOPSY  09/21/2019   Procedure: BIOPSY;  Surgeon: Irving Copas., MD;  Location: [facility];  Service: Gastroenterology;;   COLONOSCOPY WITH PROPOFOL N/A 09/21/2019   Procedure: COLONOSCOPY WITH PROPOFOL;  Surgeon: Irving Copas., MD;  Location: [facility];  Service: Gastroenterology;  Laterality: N/A;   ESOPHAGOGASTRODUODENOSCOPY (EGD) WITH PROPOFOL N/A 09/21/2019   Procedure: ESOPHAGOGASTRODUODENOSCOPY (EGD) WITH PROPOFOL;  Surgeon: Rush Landmark Telford Nab., MD;  Location: [facility];  Service: Gastroenterology;  Laterality: N/A;    Family History  Problem Relation Age of Onset   Diabetes Mother    Healthy Father    Diabetes Sister    Diabetes Sister    Pancreatic cancer Paternal Grandmother 52   Colon cancer Neg Hx    Stomach cancer Neg Hx    Esophageal cancer Neg Hx    Inflammatory bowel disease Neg Hx    Liver disease Neg Hx    Rectal cancer Neg Hx       Social History   Socioeconomic History   Marital status: Married    Spouse name: Not on file    Number of children: Not on file   Years of education: Not on file   Highest education level: Not on file  Occupational History   Not on file  Tobacco Use   Smoking status: Former    Packs/day: 0.50    Years: 36.00    Total pack years: 18.00    Types: Cigarettes    Start date: 01/13/1980    Quit date: 01/03/2018    Years since quitting: 4.0   Smokeless tobacco: Never   Tobacco comments:    down to 5 cigs a day  Vaping Use   Vaping Use: Never used  Substance and Sexual Activity   Alcohol use: No    Alcohol/week: 0.0 standard drinks of alcohol   Drug use: No   Sexual activity: Not Currently    Partners: Male    Birth control/protection: None    Comment: given condoms  Other Topics Concern   Not on file  Social History Narrative   Not on file   Social Determinants of Health   Financial Resource Strain: Not on file  Food Insecurity: Not on file  Transportation Needs: Not on file  Physical Activity: Not on file  Stress: Not on file  Social Connections: Not on file    Allergies  Allergen Reactions   Dapsone Shortness Of Breath   Penicillins Anaphylaxis  Has patient had a PCN reaction causing immediate rash, facial/tongue/throat swelling, SOB or lightheadedness with hypotension: Yes Has patient had a PCN reaction causing severe rash involving mucus membranes or skin necrosis: Yes Has patient had a PCN reaction that required hospitalization: No Has patient had a PCN reaction occurring within the last 10 years: No If all of the above answers are "NO", then may proceed with Cephalosporin use.      Current Outpatient Medications:    albuterol (VENTOLIN HFA) 108 (90 Base) MCG/ACT inhaler, Inhale 2 puffs into the lungs every 6 (six) hours as needed for wheezing., Disp: 18 g, Rfl: 11   amLODipine (NORVASC) 10 MG tablet, Take 1 tablet (10 mg total) by mouth daily., Disp: 30 tablet, Rfl: 11   atorvastatin (LIPITOR) 40 MG tablet, Take 40 mg by mouth daily as needed., Disp: ,  Rfl:    bictegravir-emtricitabine-tenofovir AF (BIKTARVY) 50-200-25 MG TABS tablet, TAKE 1 TABLET BY MOUTH DAILY., Disp: 30 tablet, Rfl: 0   cholecalciferol (VITAMIN D3) 10 MCG (400 UNIT) TABS tablet, Take 400 Units by mouth in the morning and at bedtime., Disp: , Rfl:    DULERA 200-5 MCG/ACT AERO, INHALE 2 PUFFS INTO THE LUNGS TWICE DAILY, Disp: 13 g, Rfl: 5   DULoxetine (CYMBALTA) 30 MG capsule, Take 2 capsules (60 mg total) by mouth daily., Disp: 60 capsule, Rfl: 1   LINZESS 145 MCG CAPS capsule, Take 145 mcg by mouth daily as needed (constipation). , Disp: , Rfl:    LINZESS 290 MCG CAPS capsule, Take 290 mcg by mouth daily., Disp: , Rfl:    methocarbamol (ROBAXIN) 750 MG tablet, Take 750 mg by mouth 3 (three) times daily as needed for muscle spasms. , Disp: , Rfl:    NARCAN 4 MG/0.1ML LIQD nasal spray kit, Place 1 spray into the nose as needed (accidental overdose). , Disp: , Rfl:    oxyCODONE-acetaminophen (PERCOCET) 10-325 MG tablet, Take 1 tablet by mouth every 4 (four) hours as needed for pain., Disp: 30 tablet, Rfl: 0   pravastatin (PRAVACHOL) 20 MG tablet, Take 1 tablet (20 mg total) by mouth daily., Disp: 90 tablet, Rfl: 3   TRELEGY ELLIPTA 100-62.5-25 MCG/ACT AEPB, Take 1 puff by mouth daily., Disp: , Rfl:    fluorometholone (FML) 0.1 % ophthalmic suspension, Place 1 drop into both eyes in the morning and at bedtime. , Disp: , Rfl:    nystatin cream (MYCOSTATIN), Apply 1 application topically 2 (two) times daily. , Disp: , Rfl:    omeprazole (PRILOSEC) 20 MG capsule, Take 2 capsules (40 mg total) by mouth 2 (two) times daily before a meal., Disp: 60 capsule, Rfl: 4  Current Facility-Administered Medications:    fluticasone furoate-vilanterol (BREO ELLIPTA) 100-25 MCG/INH 1 puff, 1 puff, Inhalation, Daily, Autry-Lott, Simone, DO     Review of Systems  Constitutional:  Negative for activity change, appetite change, chills, diaphoresis, fatigue, fever and unexpected weight change.   HENT:  Negative for congestion, rhinorrhea, sinus pressure, sneezing, sore throat and trouble swallowing.   Eyes:  Negative for photophobia and visual disturbance.  Respiratory:  Negative for cough, chest tightness, shortness of breath, wheezing and stridor.   Cardiovascular:  Negative for chest pain, palpitations and leg swelling.  Gastrointestinal:  Negative for abdominal distention, abdominal pain, anal bleeding, blood in stool, constipation, diarrhea, nausea and vomiting.  Genitourinary:  Negative for difficulty urinating, dysuria, flank pain and hematuria.  Musculoskeletal:  Negative for arthralgias, back pain, gait problem, joint swelling and myalgias.  Skin:  Negative for color change, pallor, rash and wound.  Neurological:  Negative for dizziness, tremors, weakness and light-headedness.  Hematological:  Negative for adenopathy. Does not bruise/bleed easily.  Psychiatric/Behavioral:  Negative for agitation, behavioral problems, confusion, decreased concentration, dysphoric mood and sleep disturbance.        Objective:   Physical Exam Constitutional:      General: She is not in acute distress.    Appearance: Normal appearance. She is well-developed. She is not ill-appearing or diaphoretic.  HENT:     Head: Normocephalic and atraumatic.     Right Ear: Hearing and external ear normal.     Left Ear: Hearing and external ear normal.     Nose: No nasal deformity or rhinorrhea.  Eyes:     General: No scleral icterus.    Conjunctiva/sclera: Conjunctivae normal.     Right eye: Right conjunctiva is not injected.     Left eye: Left conjunctiva is not injected.     Pupils: Pupils are equal, round, and reactive to light.  Neck:     Vascular: No JVD.  Cardiovascular:     Rate and Rhythm: Normal rate and regular rhythm.     Heart sounds: S1 normal and S2 normal.  Pulmonary:     Effort: No respiratory distress.     Breath sounds: No wheezing.  Abdominal:     General: Bowel sounds are  normal. There is no distension.     Palpations: Abdomen is soft.     Tenderness: There is no abdominal tenderness.  Musculoskeletal:        General: Normal range of motion.     Right shoulder: Normal.     Left shoulder: Normal.     Cervical back: Normal range of motion and neck supple.     Right hip: Normal.     Left hip: Normal.     Right knee: Normal.     Left knee: Normal.  Lymphadenopathy:     Head:     Right side of head: No submandibular, preauricular or posterior auricular adenopathy.     Left side of head: No submandibular, preauricular or posterior auricular adenopathy.     Cervical: No cervical adenopathy.     Right cervical: No superficial or deep cervical adenopathy.    Left cervical: No superficial or deep cervical adenopathy.  Skin:    General: Skin is warm and dry.     Coloration: Skin is not pale.     Findings: No abrasion, bruising, ecchymosis, erythema, lesion or rash.     Nails: There is no clubbing.  Neurological:     General: No focal deficit present.     Mental Status: She is alert and oriented to person, place, and time.     Sensory: No sensory deficit.     Coordination: Coordination normal.     Gait: Gait normal.  Psychiatric:        Attention and Perception: She is attentive.        Mood and Affect: Mood normal.        Speech: Speech normal.        Behavior: Behavior normal. Behavior is cooperative.        Thought Content: Thought content normal.        Judgment: Judgment normal.           Assessment & Plan:   HIV disease:  I will add order HIV viral load CD4 count CBC with differential CMP, RPR GC and chlamydia  and I will continue  [place]  prescription  COPD: continue her on her dulera inhaled steroids and bronchodilators.  Former smoker: commended her on quitting x 4 months  Hypertension: Well-controlled on amlodipine and seeing physician at [facility].  Hyperlipidemia she will continue on her  atorvastatin  Vaccine counseling recommended back seen for COVID-19 which she received today.

## 2022-02-03 ENCOUNTER — Other Ambulatory Visit (HOSPITAL_COMMUNITY): Payer: Self-pay

## 2022-02-03 LAB — T-HELPER CELLS (CD4) COUNT (NOT AT ARMC)
CD4 % Helper T Cell: 24 % — ABNORMAL LOW (ref 33–65)
CD4 T Cell Abs: 556 /uL (ref 400–1790)

## 2022-02-04 LAB — COMPLETE METABOLIC PANEL WITH GFR
AG Ratio: 1.2 (calc) (ref 1.0–2.5)
ALT: 11 U/L (ref 6–29)
AST: 14 U/L (ref 10–35)
Albumin: 4.1 g/dL (ref 3.6–5.1)
Alkaline phosphatase (APISO): 132 U/L (ref 37–153)
BUN/Creatinine Ratio: 13 (calc) (ref 6–22)
BUN: 19 mg/dL (ref 7–25)
CO2: 31 mmol/L (ref 20–32)
Calcium: 9.4 mg/dL (ref 8.6–10.4)
Chloride: 102 mmol/L (ref 98–110)
Creat: 1.46 mg/dL — ABNORMAL HIGH (ref 0.50–1.05)
Globulin: 3.3 g/dL (calc) (ref 1.9–3.7)
Glucose, Bld: 127 mg/dL — ABNORMAL HIGH (ref 65–99)
Potassium: 4 mmol/L (ref 3.5–5.3)
Sodium: 141 mmol/L (ref 135–146)
Total Bilirubin: 0.4 mg/dL (ref 0.2–1.2)
Total Protein: 7.4 g/dL (ref 6.1–8.1)
eGFR: 41 mL/min/{1.73_m2} — ABNORMAL LOW (ref >=60)

## 2022-02-04 LAB — HIV-1 RNA QUANT-NO REFLEX-BLD
HIV 1 RNA Quant: NOT DETECTED Copies/mL
HIV-1 RNA Quant, Log: NOT DETECTED Log cps/mL

## 2022-02-04 LAB — CBC WITH DIFFERENTIAL/PLATELET
Absolute Monocytes: 413 cells/uL (ref 200–950)
Basophils Absolute: 42 cells/uL (ref 0–200)
Basophils Relative: 0.8 %
Eosinophils Absolute: 122 cells/uL (ref 15–500)
Eosinophils Relative: 2.3 %
HCT: 47.7 % — ABNORMAL HIGH (ref 35.0–45.0)
Hemoglobin: 16.4 g/dL — ABNORMAL HIGH (ref 11.7–15.5)
Lymphs Abs: 2422 cells/uL (ref 850–3900)
MCH: 31.8 pg (ref 27.0–33.0)
MCHC: 34.4 g/dL (ref 32.0–36.0)
MCV: 92.6 fL (ref 80.0–100.0)
MPV: 9.5 fL (ref 7.5–12.5)
Monocytes Relative: 7.8 %
Neutro Abs: 2300 cells/uL (ref 1500–7800)
Neutrophils Relative %: 43.4 %
Platelets: 251 10*3/uL (ref 140–400)
RBC: 5.15 10*6/uL — ABNORMAL HIGH (ref 3.80–5.10)
RDW: 13 % (ref 11.0–15.0)
Total Lymphocyte: 45.7 %
WBC: 5.3 10*3/uL (ref 3.8–10.8)

## 2022-02-04 LAB — LIPID PANEL
Cholesterol: 171 mg/dL (ref <200)
HDL: 55 mg/dL (ref >=50)
LDL Cholesterol (Calc): 90 mg/dL (calc)
Non-HDL Cholesterol (Calc): 116 mg/dL (calc) (ref <130)
Total CHOL/HDL Ratio: 3.1 (calc) (ref <5.0)
Triglycerides: 154 mg/dL — ABNORMAL HIGH (ref <150)

## 2022-02-04 LAB — RPR: RPR Ser Ql: NONREACTIVE

## 2022-02-05 ENCOUNTER — Telehealth: Payer: Self-pay

## 2022-02-05 NOTE — Telephone Encounter (Signed)
Called patient and LVM for her to make an appointment per Dr. Marcina Millard for incontinence supply request.  .Ozella Almond, [place]

## 2022-02-06 NOTE — Telephone Encounter (Signed)
Patient returns call to nurse line. Scheduled for 02/10/22 with Dr. Marcina Millard.   Talbot Grumbling, RN

## 2022-02-09 NOTE — Progress Notes (Deleted)
  SUBJECTIVE:   CHIEF COMPLAINT / HPI:   Meeting for incontinence supplies request  PERTINENT  PMH / PSH: ***  Past Medical History:  Diagnosis Date   Acute renal insufficiency 06/06/2014   Arthritis    Asthma    Constipation 07/19/2014   COPD (chronic obstructive pulmonary disease) with acute bronchitis ([place]) 06/06/2014   Depression    Depression, major, recurrent, moderate ([place]) 04/02/2015   Dyspnea    HIV infection ([place])    Hyperlipidemia    Hypertension    Hypertensive nephropathy 07/19/2014   On home oxygen therapy    2L   Polycythemia 12/18/2020   Sleep apnea    Smoker 12/18/2020   Unintentional weight loss 02/13/2015    OBJECTIVE:  LMP 01/12/2011  Physical Exam   ASSESSMENT/PLAN:  There are no diagnoses linked to this encounter. No follow-ups on file. Holley Bouche, MD 02/09/2022, 2:59 PM PGY-***, [facility] {    This will disappear when note is signed, click to select method of visit    :1}

## 2022-02-09 NOTE — Patient Instructions (Incomplete)
It was great to see you! Thank you for allowing me to participate in your care!  I recommend that you always bring your medications to each appointment as this makes it easy to ensure we are on the correct medications and helps us not miss when refills are needed.  Our plans for today:  - *** -   We are checking some labs today, I will call you if they are abnormal will send you a MyChart message or a letter if they are normal.  If you do not hear about your labs in the next 2 weeks please let us know.***  Take care and seek immediate care sooner if you develop any concerns.   Dr. Keldric Poyer, MD [facility]  

## 2022-02-10 ENCOUNTER — Encounter: Payer: Self-pay | Admitting: Family Medicine

## 2022-02-10 ENCOUNTER — Ambulatory Visit: Payer: 59 | Admitting: Student

## 2022-02-18 ENCOUNTER — Other Ambulatory Visit (HOSPITAL_COMMUNITY): Payer: Self-pay

## 2022-02-20 ENCOUNTER — Other Ambulatory Visit: Payer: Self-pay

## 2022-02-20 ENCOUNTER — Other Ambulatory Visit (HOSPITAL_COMMUNITY): Payer: Self-pay

## 2022-02-20 ENCOUNTER — Other Ambulatory Visit: Payer: Self-pay | Admitting: Student

## 2022-02-20 DIAGNOSIS — M545 Low back pain, unspecified: Secondary | ICD-10-CM

## 2022-02-24 ENCOUNTER — Other Ambulatory Visit (HOSPITAL_COMMUNITY): Payer: Self-pay

## 2022-02-24 ENCOUNTER — Other Ambulatory Visit: Payer: Self-pay

## 2022-02-24 MED ORDER — DULOXETINE HCL 30 MG PO CPEP
60.0000 mg | ORAL_CAPSULE | Freq: Every day | ORAL | 1 refills | Status: DC
  Filled 2022-02-24: qty 60, 30d supply, fill #0
  Filled 2022-03-20: qty 60, 30d supply, fill #1

## 2022-03-16 ENCOUNTER — Other Ambulatory Visit (HOSPITAL_COMMUNITY): Payer: Self-pay

## 2022-03-20 ENCOUNTER — Other Ambulatory Visit (HOSPITAL_COMMUNITY): Payer: Self-pay

## 2022-03-23 ENCOUNTER — Other Ambulatory Visit: Payer: Self-pay

## 2022-04-13 ENCOUNTER — Other Ambulatory Visit: Payer: Self-pay

## 2022-04-30 ENCOUNTER — Other Ambulatory Visit: Payer: Self-pay | Admitting: Student

## 2022-04-30 ENCOUNTER — Other Ambulatory Visit: Payer: Self-pay

## 2022-04-30 ENCOUNTER — Other Ambulatory Visit (HOSPITAL_COMMUNITY): Payer: Self-pay

## 2022-04-30 DIAGNOSIS — M545 Low back pain, unspecified: Secondary | ICD-10-CM

## 2022-04-30 MED ORDER — DULOXETINE HCL 30 MG PO CPEP
60.0000 mg | ORAL_CAPSULE | Freq: Every day | ORAL | 1 refills | Status: DC
  Filled 2022-04-30: qty 60, 30d supply, fill #0
  Filled 2022-06-22: qty 60, 30d supply, fill #1

## 2022-05-21 ENCOUNTER — Other Ambulatory Visit (HOSPITAL_COMMUNITY): Payer: Self-pay

## 2022-06-16 ENCOUNTER — Encounter: Payer: Self-pay | Admitting: Student

## 2022-06-18 NOTE — Telephone Encounter (Signed)
Molly Dillon,  I'm not sure if you can see this message from the patient, but it looks like she has an oxygen machine she want's gone from her home. She says it's from [employer].   She said she called the company, [employer], and they want me to fax them something to have it removed.   Do you have any idea about what they might want from me, to get the machine gone? I'm not familiar with any of this/what I should do.

## 2022-06-22 ENCOUNTER — Other Ambulatory Visit: Payer: Self-pay

## 2022-07-06 ENCOUNTER — Other Ambulatory Visit: Payer: Self-pay

## 2022-07-06 ENCOUNTER — Other Ambulatory Visit (HOSPITAL_COMMUNITY): Payer: Self-pay

## 2022-07-06 MED ORDER — PRAVASTATIN SODIUM 20 MG PO TABS
20.0000 mg | ORAL_TABLET | Freq: Every day | ORAL | 3 refills | Status: DC
  Filled 2022-07-06: qty 90, 90d supply, fill #0
  Filled 2022-11-09: qty 90, 90d supply, fill #1
  Filled 2023-01-14 – 2023-02-15 (×2): qty 90, 90d supply, fill #2
  Filled 2023-06-19: qty 90, 90d supply, fill #3

## 2022-07-06 MED ORDER — BISOPROLOL-HYDROCHLOROTHIAZIDE 5-6.25 MG PO TABS
1.0000 | ORAL_TABLET | Freq: Every day | ORAL | 2 refills | Status: AC
  Filled 2022-07-06: qty 30, 30d supply, fill #0
  Filled 2022-09-08: qty 30, 30d supply, fill #1
  Filled 2022-10-08: qty 30, 30d supply, fill #2

## 2022-07-15 ENCOUNTER — Other Ambulatory Visit (HOSPITAL_COMMUNITY): Payer: Self-pay

## 2022-07-17 ENCOUNTER — Other Ambulatory Visit: Payer: Self-pay | Admitting: Student

## 2022-07-17 ENCOUNTER — Other Ambulatory Visit (HOSPITAL_COMMUNITY): Payer: Self-pay

## 2022-07-19 MED ORDER — ATORVASTATIN CALCIUM 40 MG PO TABS
40.0000 mg | ORAL_TABLET | Freq: Every day | ORAL | 1 refills | Status: DC | PRN
  Filled 2022-07-19: qty 30, 30d supply, fill #0

## 2022-07-20 ENCOUNTER — Other Ambulatory Visit: Payer: Self-pay | Admitting: Student

## 2022-07-20 ENCOUNTER — Other Ambulatory Visit: Payer: Self-pay

## 2022-07-20 MED ORDER — TRELEGY ELLIPTA 100-62.5-25 MCG/ACT IN AEPB
1.0000 | INHALATION_SPRAY | Freq: Every day | RESPIRATORY_TRACT | 2 refills | Status: DC
  Filled 2022-07-20: qty 60, 30d supply, fill #0
  Filled 2022-09-08: qty 60, 30d supply, fill #1
  Filled 2022-10-08: qty 60, 30d supply, fill #2

## 2022-07-21 ENCOUNTER — Other Ambulatory Visit (HOSPITAL_COMMUNITY): Payer: Self-pay

## 2022-08-03 ENCOUNTER — Other Ambulatory Visit (HOSPITAL_COMMUNITY): Payer: Self-pay

## 2022-08-06 ENCOUNTER — Other Ambulatory Visit (HOSPITAL_COMMUNITY): Payer: Self-pay

## 2022-08-11 NOTE — Progress Notes (Deleted)
Subjective:  Chief complaint: followup for HIV disease on medications   Patient ID: Molly Dillon, female    DOB: Apr 01, 1960, 62 y.o.   MRN: [id number]  HPI  Molly Dillon is a 62 year old black woman living with HIV and also comorbid COPD, asthma in the hist with a history of smoking.      Past Medical History:  Diagnosis Date  . Acute renal insufficiency 06/06/2014  . Arthritis   . Asthma   . Constipation 07/19/2014  . COPD (chronic obstructive pulmonary disease) with acute bronchitis ([facility]) 06/06/2014  . Depression   . Depression, major, recurrent, moderate ([facility]) 04/02/2015  . Dyspnea   . HIV infection ([facility])   . Hyperlipidemia   . Hypertension   . Hypertensive nephropathy 07/19/2014  . On home oxygen therapy    2L  . Polycythemia 12/18/2020  . Sleep apnea   . Smoker 12/18/2020  . Unintentional weight loss 02/13/2015    Past Surgical History:  Procedure Laterality Date  . BIOPSY  09/21/2019   Procedure: BIOPSY;  Surgeon: Molly Dillon., MD;  Location: [facility];  Service: Gastroenterology;;  . COLONOSCOPY WITH PROPOFOL N/A 09/21/2019   Procedure: COLONOSCOPY WITH PROPOFOL;  Surgeon: Molly Dillon., MD;  Location: [facility];  Service: Gastroenterology;  Laterality: N/A;  . ESOPHAGOGASTRODUODENOSCOPY (EGD) WITH PROPOFOL N/A 09/21/2019   Procedure: ESOPHAGOGASTRODUODENOSCOPY (EGD) WITH PROPOFOL;  Surgeon: Molly Dillon., MD;  Location: [facility];  Service: Gastroenterology;  Laterality: N/A;    Family History  Problem Relation Age of Onset  . Diabetes Mother   . Healthy Father   . Diabetes Sister   . Diabetes Sister   . Pancreatic cancer Paternal Grandmother 26  . Colon cancer Neg Hx   . Stomach cancer Neg Hx   . Esophageal cancer Neg Hx   . Inflammatory bowel disease Neg Hx   . Liver disease Neg Hx   . Rectal cancer Neg Hx       Social History   Socioeconomic History  . Marital status: Married    Spouse name: Not on file  . Number of  children: Not on file  . Years of education: Not on file  . Highest education level: Not on file  Occupational History  . Not on file  Tobacco Use  . Smoking status: Former    Current packs/day: 0.00    Average packs/day: 0.5 packs/day for 38.0 years (19.0 ttl pk-yrs)    Types: Cigarettes    Start date: 01/13/1980    Quit date: 01/03/2018    Years since quitting: 4.6  . Smokeless tobacco: Never  . Tobacco comments:    down to 5 cigs a day  Vaping Use  . Vaping status: Never Used  Substance and Sexual Activity  . Alcohol use: No    Alcohol/week: 0.0 standard drinks of alcohol  . Drug use: No  . Sexual activity: Not Currently    Partners: Male    Birth control/protection: None    Comment: given condoms  Other Topics Concern  . Not on file  Social History Narrative  . Not on file   Social Determinants of Health   Financial Resource Strain: Not on file  Food Insecurity: Not on file  Transportation Needs: Not on file  Physical Activity: Not on file  Stress: Not on file  Social Connections: Not on file    Allergies  Allergen Reactions  . Dapsone Shortness Of Breath  . Penicillins Anaphylaxis    Has patient had a  PCN reaction causing immediate rash, facial/tongue/throat swelling, SOB or lightheadedness with hypotension: Yes Has patient had a PCN reaction causing severe rash involving mucus membranes or skin necrosis: Yes Has patient had a PCN reaction that required hospitalization: No Has patient had a PCN reaction occurring within the last 10 years: No If all of the above answers are "NO", then may proceed with Cephalosporin use.      Current Outpatient Medications:  .  albuterol (VENTOLIN HFA) 108 (90 Base) MCG/ACT inhaler, Inhale 2 puffs into the lungs every 6 (six) hours as needed for wheezing., Disp: 18 g, Rfl: 11 .  amLODipine (NORVASC) 10 MG tablet, Take 1 tablet (10 mg total) by mouth daily., Disp: 30 tablet, Rfl: 11 .  atorvastatin (LIPITOR) 40 MG tablet, Take  1 tablet (40 mg total) by mouth daily as needed. MAKE PCP APPOINTMENT, Disp: 30 tablet, Rfl: 1 .  bictegravir-emtricitabine-tenofovir AF (BIKTARVY) 50-200-25 MG TABS tablet, TAKE 1 TABLET BY MOUTH DAILY., Disp: 30 tablet, Rfl: 11 .  bisoprolol-hydrochlorothiazide (ZIAC) 5-6.25 MG tablet, Take 1 tablet by mouth daily., Disp: 30 tablet, Rfl: 2 .  cholecalciferol (VITAMIN D3) 10 MCG (400 UNIT) TABS tablet, Take 400 Units by mouth in the morning and at bedtime., Disp: , Rfl:  .  DULERA 200-5 MCG/ACT AERO, INHALE 2 PUFFS INTO THE LUNGS TWICE DAILY, Disp: 13 g, Rfl: 5 .  DULoxetine (CYMBALTA) 30 MG capsule, Take 2 capsules (60 mg total) by mouth daily., Disp: 60 capsule, Rfl: 1 .  LINZESS 145 MCG CAPS capsule, Take 145 mcg by mouth daily as needed (constipation). , Disp: , Rfl:  .  LINZESS 290 MCG CAPS capsule, Take 290 mcg by mouth daily., Disp: , Rfl:  .  methocarbamol (ROBAXIN) 750 MG tablet, Take 750 mg by mouth 3 (three) times daily as needed for muscle spasms. , Disp: , Rfl:  .  NARCAN 4 MG/0.1ML LIQD nasal spray kit, Place 1 spray into the nose as needed (accidental overdose). , Disp: , Rfl:  .  omeprazole (PRILOSEC) 20 MG capsule, Take 2 capsules (40 mg total) by mouth 2 (two) times daily before a meal., Disp: 60 capsule, Rfl: 4 .  oxyCODONE-acetaminophen (PERCOCET) 10-325 MG tablet, Take 1 tablet by mouth every 4 (four) hours as needed for pain., Disp: 30 tablet, Rfl: 0 .  pravastatin (PRAVACHOL) 20 MG tablet, Take 1 tablet (20 mg total) by mouth daily., Disp: 90 tablet, Rfl: 3 .  TRELEGY ELLIPTA 100-62.5-25 MCG/ACT AEPB, Take 1 puff by mouth daily., Disp: 60 each, Rfl: 2  Current Facility-Administered Medications:  .  fluticasone furoate-vilanterol (BREO ELLIPTA) 100-25 MCG/INH 1 puff, 1 puff, Inhalation, Daily, Dillon, Simone, DO     Review of Systems      Objective:   Physical Exam         Assessment & Plan:   HIV disease:  I will add order HIV viral load CD4 count CBC  with differential CMP, RPR GC and chlamydia and I will continue  Molly Dillon's [facility]  prescription  COPD: continue her on her dulera inhaled steroids and bronchodilators.  Former smoker: commended her on quitting x 4 months  Hypertension: Well-controlled on amlodipine and seeing physician at [facility].  Hyperlipidemia she will continue on her atorvastatin  Vaccine counseling recommended back seen for COVID-19 which she received today.

## 2022-08-12 ENCOUNTER — Other Ambulatory Visit: Payer: Self-pay

## 2022-08-12 ENCOUNTER — Ambulatory Visit: Payer: 59 | Admitting: Infectious Disease

## 2022-08-12 DIAGNOSIS — Z124 Encounter for screening for malignant neoplasm of cervix: Secondary | ICD-10-CM

## 2022-08-12 DIAGNOSIS — N1832 Chronic kidney disease, stage 3b: Secondary | ICD-10-CM

## 2022-08-12 DIAGNOSIS — A5901 Trichomonal vulvovaginitis: Secondary | ICD-10-CM

## 2022-08-12 DIAGNOSIS — B20 Human immunodeficiency virus [HIV] disease: Secondary | ICD-10-CM

## 2022-08-12 DIAGNOSIS — E7800 Pure hypercholesterolemia, unspecified: Secondary | ICD-10-CM

## 2022-08-12 DIAGNOSIS — J45909 Unspecified asthma, uncomplicated: Secondary | ICD-10-CM

## 2022-09-02 ENCOUNTER — Encounter: Payer: Self-pay | Admitting: Infectious Disease

## 2022-09-02 ENCOUNTER — Other Ambulatory Visit (HOSPITAL_COMMUNITY)
Admission: RE | Admit: 2022-09-02 | Discharge: 2022-09-02 | Disposition: A | Discharge status: Home / Self Care | Payer: 59 | Source: Ambulatory Visit | Attending: Infectious Disease | Admitting: Infectious Disease

## 2022-09-02 ENCOUNTER — Other Ambulatory Visit: Payer: Self-pay

## 2022-09-02 ENCOUNTER — Ambulatory Visit (INDEPENDENT_AMBULATORY_CARE_PROVIDER_SITE_OTHER): Payer: 59 | Admitting: Infectious Disease

## 2022-09-02 ENCOUNTER — Other Ambulatory Visit (HOSPITAL_COMMUNITY): Payer: Self-pay

## 2022-09-02 VITALS — BP 113/75 | HR 74 | Temp 97.8°F | Ht 64.0 in | Wt 139.0 lb

## 2022-09-02 DIAGNOSIS — B20 Human immunodeficiency virus [HIV] disease: Secondary | ICD-10-CM | POA: Diagnosis not present

## 2022-09-02 DIAGNOSIS — J449 Chronic obstructive pulmonary disease, unspecified: Secondary | ICD-10-CM | POA: Diagnosis not present

## 2022-09-02 DIAGNOSIS — E785 Hyperlipidemia, unspecified: Secondary | ICD-10-CM

## 2022-09-02 DIAGNOSIS — I10 Essential (primary) hypertension: Secondary | ICD-10-CM

## 2022-09-02 DIAGNOSIS — Z87891 Personal history of nicotine dependence: Secondary | ICD-10-CM

## 2022-09-02 MED ORDER — ATORVASTATIN CALCIUM 40 MG PO TABS
40.0000 mg | ORAL_TABLET | Freq: Every day | ORAL | 1 refills | Status: DC | PRN
  Filled 2022-09-02 (×2): qty 30, 30d supply, fill #0
  Filled 2022-10-08: qty 30, 30d supply, fill #1

## 2022-09-02 MED ORDER — BIKTARVY 50-200-25 MG PO TABS
1.0000 | ORAL_TABLET | Freq: Every day | ORAL | 11 refills | Status: DC
  Filled 2022-09-02 – 2022-09-08 (×2): qty 30, 30d supply, fill #0
  Filled 2022-10-08: qty 30, 30d supply, fill #1
  Filled 2022-12-07: qty 30, 30d supply, fill #2
  Filled 2023-01-11: qty 30, 30d supply, fill #3
  Filled 2023-02-05: qty 30, 30d supply, fill #4
  Filled 2023-03-12: qty 30, 30d supply, fill #5
  Filled 2023-04-12: qty 30, 30d supply, fill #6

## 2022-09-02 NOTE — Progress Notes (Signed)
Subjective:  Chief complaint: For HIV disease on medications   Patient ID: Molly Dillon, female    DOB: February 08, 1960, 62 y.o.   MRN: [id number]  HPI  Molly Dillon is a 62 year old black woman living with HIV and also comorbid COPD, asthma in the hist with a history of smoking.    He is doing quite well and has no complaints today.    Past Medical History:  Diagnosis Date   Acute renal insufficiency 06/06/2014   Arthritis    Asthma    Constipation 07/19/2014   COPD (chronic obstructive pulmonary disease) with acute bronchitis (HCC) 06/06/2014   Depression    Depression, major, recurrent, moderate (HCC) 04/02/2015   Dyspnea    HIV infection (HCC)    Hyperlipidemia    Hypertension    Hypertensive nephropathy 07/19/2014   On home oxygen therapy    2L   Polycythemia 12/18/2020   Sleep apnea    Smoker 12/18/2020   Unintentional weight loss 02/13/2015    Past Surgical History:  Procedure Laterality Date   BIOPSY  09/21/2019   Procedure: BIOPSY;  Surgeon: Lemar Lofty., MD;  Location: [facility];  Service: Gastroenterology;;   COLONOSCOPY WITH PROPOFOL N/A 09/21/2019   Procedure: COLONOSCOPY WITH PROPOFOL;  Surgeon: Lemar Lofty., MD;  Location: [facility];  Service: Gastroenterology;  Laterality: N/A;   ESOPHAGOGASTRODUODENOSCOPY (EGD) WITH PROPOFOL N/A 09/21/2019   Procedure: ESOPHAGOGASTRODUODENOSCOPY (EGD) WITH PROPOFOL;  Surgeon: Meridee Score Netty Starring., MD;  Location: [facility];  Service: Gastroenterology;  Laterality: N/A;    Family History  Problem Relation Age of Onset   Diabetes Mother    Healthy Father    Diabetes Sister    Diabetes Sister    Pancreatic cancer Paternal Grandmother 13   Colon cancer Neg Hx    Stomach cancer Neg Hx    Esophageal cancer Neg Hx    Inflammatory bowel disease Neg Hx    Liver disease Neg Hx    Rectal cancer Neg Hx       Social History   Socioeconomic History   Marital status: Married    Spouse name: Not on file    Number of children: Not on file   Years of education: Not on file   Highest education level: Not on file  Occupational History   Not on file  Tobacco Use   Smoking status: Former    Current packs/day: 0.00    Average packs/day: 0.5 packs/day for 38.0 years (19.0 ttl pk-yrs)    Types: Cigarettes    Start date: 01/13/1980    Quit date: 01/03/2018    Years since quitting: 4.6   Smokeless tobacco: Never   Tobacco comments:    down to 5 cigs a day  Vaping Use   Vaping status: Never Used  Substance and Sexual Activity   Alcohol use: No    Alcohol/week: 0.0 standard drinks of alcohol   Drug use: No   Sexual activity: Not Currently    Partners: Male    Birth control/protection: None    Comment: given condoms  Other Topics Concern   Not on file  Social History Narrative   Not on file   Social Determinants of Health   Financial Resource Strain: Not on file  Food Insecurity: Not on file  Transportation Needs: Not on file  Physical Activity: Not on file  Stress: Not on file  Social Connections: Not on file    Allergies  Allergen Reactions   Dapsone Shortness Of Breath  Penicillins Anaphylaxis    Has patient had a PCN reaction causing immediate rash, facial/tongue/throat swelling, SOB or lightheadedness with hypotension: Yes Has patient had a PCN reaction causing severe rash involving mucus membranes or skin necrosis: Yes Has patient had a PCN reaction that required hospitalization: No Has patient had a PCN reaction occurring within the last 10 years: No If all of the above answers are "NO", then may proceed with Cephalosporin use.      Current Outpatient Medications:    albuterol (VENTOLIN HFA) 108 (90 Base) MCG/ACT inhaler, Inhale 2 puffs into the lungs every 6 (six) hours as needed for wheezing., Disp: 18 g, Rfl: 11   atorvastatin (LIPITOR) 40 MG tablet, Take 1 tablet (40 mg total) by mouth daily as needed. MAKE PCP APPOINTMENT, Disp: 30 tablet, Rfl: 1    bictegravir-emtricitabine-tenofovir AF (BIKTARVY) 50-200-25 MG TABS tablet, TAKE 1 TABLET BY MOUTH DAILY., Disp: 30 tablet, Rfl: 11   bisoprolol-hydrochlorothiazide (ZIAC) 5-6.25 MG tablet, Take 1 tablet by mouth daily., Disp: 30 tablet, Rfl: 2   cholecalciferol (VITAMIN D3) 10 MCG (400 UNIT) TABS tablet, Take 400 Units by mouth in the morning and at bedtime., Disp: , Rfl:    DULERA 200-5 MCG/ACT AERO, INHALE 2 PUFFS INTO THE LUNGS TWICE DAILY, Disp: 13 g, Rfl: 5   DULoxetine (CYMBALTA) 30 MG capsule, Take 2 capsules (60 mg total) by mouth daily., Disp: 60 capsule, Rfl: 1   LINZESS 290 MCG CAPS capsule, Take 290 mcg by mouth daily., Disp: , Rfl:    methocarbamol (ROBAXIN) 750 MG tablet, Take 750 mg by mouth 3 (three) times daily as needed for muscle spasms. , Disp: , Rfl:    NARCAN 4 MG/0.1ML LIQD nasal spray kit, Place 1 spray into the nose as needed (accidental overdose). , Disp: , Rfl:    oxyCODONE-acetaminophen (PERCOCET) 10-325 MG tablet, Take 1 tablet by mouth every 4 (four) hours as needed for pain., Disp: 30 tablet, Rfl: 0   pravastatin (PRAVACHOL) 20 MG tablet, Take 1 tablet (20 mg total) by mouth daily., Disp: 90 tablet, Rfl: 3   TRELEGY ELLIPTA 100-62.5-25 MCG/ACT AEPB, Take 1 puff by mouth daily., Disp: 60 each, Rfl: 2   amLODipine (NORVASC) 10 MG tablet, Take 1 tablet (10 mg total) by mouth daily., Disp: 30 tablet, Rfl: 11   LINZESS 145 MCG CAPS capsule, Take 145 mcg by mouth daily as needed (constipation). , Disp: , Rfl:    omeprazole (PRILOSEC) 20 MG capsule, Take 2 capsules (40 mg total) by mouth 2 (two) times daily before a meal., Disp: 60 capsule, Rfl: 4  Current Facility-Administered Medications:    fluticasone furoate-vilanterol (BREO ELLIPTA) 100-25 MCG/INH 1 puff, 1 puff, Inhalation, Daily, Autry-Lott, Simone, DO     Review of Systems  Constitutional:  Negative for activity change, appetite change, chills, diaphoresis, fatigue, fever and unexpected weight change.  HENT:   Negative for congestion, rhinorrhea, sinus pressure, sneezing, sore throat and trouble swallowing.   Eyes:  Negative for photophobia and visual disturbance.  Respiratory:  Negative for cough, chest tightness, shortness of breath, wheezing and stridor.   Cardiovascular:  Negative for chest pain, palpitations and leg swelling.  Gastrointestinal:  Negative for abdominal distention, abdominal pain, anal bleeding, blood in stool, constipation, diarrhea, nausea and vomiting.  Genitourinary:  Negative for difficulty urinating, dysuria, flank pain and hematuria.  Musculoskeletal:  Negative for arthralgias, back pain, gait problem, joint swelling and myalgias.  Skin:  Negative for color change, pallor, rash and wound.  Neurological:  Negative for dizziness, tremors, weakness and light-headedness.  Hematological:  Negative for adenopathy. Does not bruise/bleed easily.  Psychiatric/Behavioral:  Negative for agitation, behavioral problems, confusion, decreased concentration, dysphoric mood and sleep disturbance.        Objective:   Physical Exam Constitutional:      General: She is not in acute distress.    Appearance: Normal appearance. She is well-developed. She is not ill-appearing or diaphoretic.  HENT:     Head: Normocephalic and atraumatic.     Right Ear: Hearing and external ear normal.     Left Ear: Hearing and external ear normal.     Nose: No nasal deformity or rhinorrhea.  Eyes:     General: No scleral icterus.    Conjunctiva/sclera: Conjunctivae normal.     Right eye: Right conjunctiva is not injected.     Left eye: Left conjunctiva is not injected.     Pupils: Pupils are equal, round, and reactive to light.  Neck:     Vascular: No JVD.  Cardiovascular:     Rate and Rhythm: Normal rate and regular rhythm.     Heart sounds: S1 normal and S2 normal. No murmur heard. Abdominal:     General: Bowel sounds are normal. There is no distension.     Palpations: Abdomen is soft.      Tenderness: There is no abdominal tenderness.  Musculoskeletal:        General: Normal range of motion.     Right shoulder: Normal.     Left shoulder: Normal.     Cervical back: Normal range of motion and neck supple.     Right hip: Normal.     Left hip: Normal.     Right knee: Normal.     Left knee: Normal.  Lymphadenopathy:     Head:     Right side of head: No submandibular, preauricular or posterior auricular adenopathy.     Left side of head: No submandibular, preauricular or posterior auricular adenopathy.     Cervical: No cervical adenopathy.     Right cervical: No superficial or deep cervical adenopathy.    Left cervical: No superficial or deep cervical adenopathy.  Skin:    General: Skin is warm and dry.     Coloration: Skin is not pale.     Findings: No abrasion, bruising, ecchymosis, erythema, lesion or rash.     Nails: There is no clubbing.  Neurological:     Mental Status: She is alert and oriented to person, place, and time.     Sensory: No sensory deficit.     Coordination: Coordination normal.     Gait: Gait normal.  Psychiatric:        Attention and Perception: She is attentive.        Speech: Speech normal.        Behavior: Behavior normal. Behavior is cooperative.        Thought Content: Thought content normal.        Judgment: Judgment normal.           Assessment & Plan:    HIV disease:  I have reviewed Tametria L Faria's labs including viral load which was  Lab Results  Component Value Date   HIV1RNAQUANT Not Detected 02/02/2022   and cd4 which was  Lab Results  Component Value Date   CD4TABS 556 02/02/2022     I am continuing patient's prescription for Biktarvy   Hyperlipidemia she will continue her atorvastatin.  Hypertension well-controlled on  amlodipine.  Former smoker she is continue to be without tobacco.  COPD she will continue on Dulera inhaled bronchodilators

## 2022-09-03 ENCOUNTER — Telehealth: Payer: Self-pay

## 2022-09-03 LAB — URINE CYTOLOGY ANCILLARY ONLY
Chlamydia: NEGATIVE
Comment: NEGATIVE
Comment: NORMAL
Neisseria Gonorrhea: NEGATIVE

## 2022-09-03 NOTE — Telephone Encounter (Signed)
-----   Message from [place] sent at 09/03/2022  9:04 AM EDT ----- Regarding: creatinine up Kidney function is worse on our labs she should see PCP for workup for this ----- Message ----- From: Janace Hoard Lab Results In Sent: 09/02/2022   4:12 PM EDT To: Randall Hiss, MD

## 2022-09-03 NOTE — Telephone Encounter (Signed)
Left voicemail asking patient to return my call.   Tiffany P Speight, CMA  

## 2022-09-04 LAB — COMPLETE METABOLIC PANEL WITH GFR
AG Ratio: 1.2 (calc) (ref 1.0–2.5)
ALT: 14 U/L (ref 6–29)
AST: 20 U/L (ref 10–35)
Albumin: 4.2 g/dL (ref 3.6–5.1)
Alkaline phosphatase (APISO): 126 U/L (ref 37–153)
BUN/Creatinine Ratio: 15 (calc) (ref 6–22)
BUN: 26 mg/dL — ABNORMAL HIGH (ref 7–25)
CO2: 31 mmol/L (ref 20–32)
Calcium: 10.1 mg/dL (ref 8.6–10.4)
Chloride: 101 mmol/L (ref 98–110)
Creat: 1.79 mg/dL — ABNORMAL HIGH (ref 0.50–1.05)
Globulin: 3.5 g/dL (ref 1.9–3.7)
Glucose, Bld: 97 mg/dL (ref 65–99)
Potassium: 4.8 mmol/L (ref 3.5–5.3)
Sodium: 142 mmol/L (ref 135–146)
Total Bilirubin: 0.4 mg/dL (ref 0.2–1.2)
Total Protein: 7.7 g/dL (ref 6.1–8.1)
eGFR: 32 mL/min/{1.73_m2} — ABNORMAL LOW (ref >=60)

## 2022-09-04 LAB — LIPID PANEL
Cholesterol: 165 mg/dL (ref <200)
HDL: 55 mg/dL (ref >=50)
LDL Cholesterol (Calc): 93 mg/dL
Non-HDL Cholesterol (Calc): 110 mg/dL (calc) (ref <130)
Total CHOL/HDL Ratio: 3 (calc) (ref <5.0)
Triglycerides: 82 mg/dL (ref <150)

## 2022-09-04 LAB — CBC WITH DIFFERENTIAL/PLATELET
Absolute Monocytes: 376 {cells}/uL (ref 200–950)
Basophils Absolute: 48 cells/uL (ref 0–200)
Basophils Relative: 0.9 %
Eosinophils Absolute: 80 {cells}/uL (ref 15–500)
Eosinophils Relative: 1.5 %
HCT: 47.7 % — ABNORMAL HIGH (ref 35.0–45.0)
Hemoglobin: 16.1 g/dL — ABNORMAL HIGH (ref 11.7–15.5)
Lymphs Abs: 2104 {cells}/uL (ref 850–3900)
MCH: 30.8 pg (ref 27.0–33.0)
MCHC: 33.8 g/dL (ref 32.0–36.0)
MCV: 91.2 fL (ref 80.0–100.0)
MPV: 9.5 fL (ref 7.5–12.5)
Monocytes Relative: 7.1 %
Neutro Abs: 2692 cells/uL (ref 1500–7800)
Neutrophils Relative %: 50.8 %
Platelets: 241 10*3/uL (ref 140–400)
RBC: 5.23 10*6/uL — ABNORMAL HIGH (ref 3.80–5.10)
RDW: 13.6 % (ref 11.0–15.0)
Total Lymphocyte: 39.7 %
WBC: 5.3 10*3/uL (ref 3.8–10.8)

## 2022-09-04 LAB — HIV-1 RNA QUANT-NO REFLEX-BLD
HIV 1 RNA Quant: NOT DETECTED {copies}/mL
HIV-1 RNA Quant, Log: NOT DETECTED {Log_copies}/mL

## 2022-09-04 LAB — RPR: RPR Ser Ql: NONREACTIVE

## 2022-09-04 LAB — T-HELPER CELLS (CD4) COUNT (NOT AT ARMC)
CD4 % Helper T Cell: 21 % — ABNORMAL LOW (ref 33–65)
CD4 T Cell Abs: 369 /uL — ABNORMAL LOW (ref 400–1790)

## 2022-09-08 ENCOUNTER — Other Ambulatory Visit: Payer: Self-pay | Admitting: Family Medicine

## 2022-09-08 ENCOUNTER — Other Ambulatory Visit: Payer: Self-pay

## 2022-09-08 ENCOUNTER — Other Ambulatory Visit: Payer: Self-pay | Admitting: Student

## 2022-09-08 ENCOUNTER — Other Ambulatory Visit (HOSPITAL_COMMUNITY): Payer: Self-pay

## 2022-09-08 DIAGNOSIS — M545 Low back pain, unspecified: Secondary | ICD-10-CM

## 2022-09-08 DIAGNOSIS — I10 Essential (primary) hypertension: Secondary | ICD-10-CM

## 2022-09-08 MED ORDER — DULOXETINE HCL 30 MG PO CPEP
60.0000 mg | ORAL_CAPSULE | Freq: Every day | ORAL | 1 refills | Status: DC
Start: 2022-09-08 — End: 2022-11-09
  Filled 2022-09-08: qty 60, 30d supply, fill #0
  Filled 2022-10-08: qty 60, 30d supply, fill #1

## 2022-09-08 MED ORDER — AMLODIPINE BESYLATE 10 MG PO TABS
10.0000 mg | ORAL_TABLET | Freq: Every day | ORAL | 2 refills | Status: DC
  Filled 2022-09-08: qty 30, 30d supply, fill #0
  Filled 2022-10-08: qty 30, 30d supply, fill #1
  Filled 2022-11-09: qty 30, 30d supply, fill #2

## 2022-09-16 ENCOUNTER — Other Ambulatory Visit: Payer: Self-pay

## 2022-09-16 ENCOUNTER — Other Ambulatory Visit (HOSPITAL_COMMUNITY): Payer: Self-pay

## 2022-10-08 ENCOUNTER — Other Ambulatory Visit: Payer: Self-pay

## 2022-10-08 NOTE — Progress Notes (Signed)
Specialty Pharmacy Refill Coordination Note  Molly Dillon is a 62 y.o. female contacted today regarding refills of specialty medication(s) Bictegravir-Emtricitab-Tenofov .  Patient requested Delivery  on 10/19/22  to verified address [address] [place] [address]   Medication will be filled on 10/16/22.

## 2022-10-27 ENCOUNTER — Other Ambulatory Visit: Payer: Self-pay

## 2022-11-09 ENCOUNTER — Other Ambulatory Visit: Payer: Self-pay | Admitting: Infectious Disease

## 2022-11-09 ENCOUNTER — Other Ambulatory Visit: Payer: Self-pay

## 2022-11-09 ENCOUNTER — Other Ambulatory Visit: Payer: Self-pay | Admitting: Student

## 2022-11-09 ENCOUNTER — Other Ambulatory Visit (HOSPITAL_COMMUNITY): Payer: Self-pay

## 2022-11-09 DIAGNOSIS — M545 Low back pain, unspecified: Secondary | ICD-10-CM

## 2022-11-09 MED ORDER — ATORVASTATIN CALCIUM 40 MG PO TABS
40.0000 mg | ORAL_TABLET | Freq: Every day | ORAL | 4 refills | Status: DC | PRN
  Filled 2022-11-09: qty 30, 30d supply, fill #0
  Filled 2022-12-21: qty 30, 30d supply, fill #1
  Filled 2023-01-14: qty 30, 30d supply, fill #2
  Filled 2023-02-15: qty 30, 30d supply, fill #3
  Filled 2023-03-15: qty 30, 30d supply, fill #4

## 2022-11-10 MED ORDER — DULOXETINE HCL 30 MG PO CPEP
60.0000 mg | ORAL_CAPSULE | Freq: Every day | ORAL | 1 refills | Status: DC
  Filled 2022-11-10: qty 60, 30d supply, fill #0
  Filled 2022-12-21: qty 60, 30d supply, fill #1

## 2022-11-11 ENCOUNTER — Other Ambulatory Visit (HOSPITAL_COMMUNITY): Payer: Self-pay

## 2022-11-11 ENCOUNTER — Other Ambulatory Visit: Payer: Self-pay

## 2022-11-24 ENCOUNTER — Other Ambulatory Visit (HOSPITAL_COMMUNITY): Payer: Self-pay

## 2022-11-24 ENCOUNTER — Other Ambulatory Visit: Payer: Self-pay

## 2022-12-01 ENCOUNTER — Other Ambulatory Visit (HOSPITAL_COMMUNITY): Payer: Self-pay

## 2022-12-03 ENCOUNTER — Other Ambulatory Visit: Payer: Self-pay

## 2022-12-07 ENCOUNTER — Other Ambulatory Visit (HOSPITAL_COMMUNITY): Payer: Self-pay

## 2022-12-07 NOTE — Progress Notes (Signed)
Specialty Pharmacy Ongoing Clinical Assessment Note  Molly Dillon is a 62 y.o. female who is being followed by the specialty pharmacy service for RxSp HIV   Patient's specialty medication(s) reviewed today: Bictegravir-Emtricitab-Tenofov   Missed doses in the last 4 weeks: 0   Patient/Caregiver did not have any additional questions or concerns.   Therapeutic benefit summary: Patient is achieving benefit   Adverse events/side effects summary: No adverse events/side effects   Patient's therapy is appropriate to: Continue    Goals Addressed             This Visit's Progress    Achieve Undetectable HIV Viral Load < 20       Patient is on track. Patient will maintain adherence.  Viral load remains undetectable as of 09/02/22.         Follow up:  6 months  Servando Snare Specialty Pharmacist

## 2022-12-07 NOTE — Progress Notes (Signed)
Specialty Pharmacy Refill Coordination Note  Molly Dillon is a 62 y.o. female contacted today regarding refills of specialty medication(s) Bictegravir-Emtricitab-Tenofov   Patient requested Delivery   Delivery date: 12/09/22   Verified address: [address] [place] [address]   Medication will be filled on 12/08/22.

## 2022-12-08 ENCOUNTER — Other Ambulatory Visit: Payer: Self-pay

## 2022-12-21 ENCOUNTER — Other Ambulatory Visit: Payer: Self-pay

## 2022-12-21 ENCOUNTER — Other Ambulatory Visit: Payer: Self-pay | Admitting: Student

## 2022-12-21 ENCOUNTER — Other Ambulatory Visit (HOSPITAL_COMMUNITY): Payer: Self-pay

## 2022-12-21 DIAGNOSIS — I10 Essential (primary) hypertension: Secondary | ICD-10-CM

## 2022-12-22 ENCOUNTER — Other Ambulatory Visit (HOSPITAL_COMMUNITY): Payer: Self-pay

## 2022-12-22 ENCOUNTER — Other Ambulatory Visit: Payer: Self-pay

## 2022-12-22 MED ORDER — AMLODIPINE BESYLATE 10 MG PO TABS
10.0000 mg | ORAL_TABLET | Freq: Every day | ORAL | 2 refills | Status: DC
  Filled 2022-12-22: qty 30, 30d supply, fill #0
  Filled 2023-01-14: qty 30, 30d supply, fill #1
  Filled 2023-02-15: qty 30, 30d supply, fill #2

## 2023-01-11 ENCOUNTER — Other Ambulatory Visit: Payer: Self-pay

## 2023-01-11 NOTE — Progress Notes (Signed)
Specialty Pharmacy Refill Coordination Note  Molly Dillon is a 62 y.o. female contacted today regarding refills of specialty medication(s) Bictegravir-Emtricitab-Tenofov Musician)   Patient requested (Patient-Rptd) Delivery   Delivery date:  (earliest medication is available for delivery 12.31.24)   Verified address: (Patient-Rptd) [address]   Medication will be filled on 12.30.24.

## 2023-01-14 ENCOUNTER — Other Ambulatory Visit (HOSPITAL_BASED_OUTPATIENT_CLINIC_OR_DEPARTMENT_OTHER): Payer: Self-pay

## 2023-01-14 ENCOUNTER — Other Ambulatory Visit: Payer: Self-pay

## 2023-01-14 ENCOUNTER — Other Ambulatory Visit: Payer: Self-pay | Admitting: Student

## 2023-01-14 DIAGNOSIS — M545 Low back pain, unspecified: Secondary | ICD-10-CM

## 2023-01-14 MED ORDER — DULOXETINE HCL 30 MG PO CPEP
60.0000 mg | ORAL_CAPSULE | Freq: Every day | ORAL | 1 refills | Status: DC
  Filled 2023-01-14: qty 60, 30d supply, fill #0
  Filled 2023-02-15: qty 60, 30d supply, fill #1

## 2023-01-15 ENCOUNTER — Other Ambulatory Visit: Payer: Self-pay

## 2023-01-19 ENCOUNTER — Other Ambulatory Visit: Payer: Self-pay

## 2023-01-22 ENCOUNTER — Other Ambulatory Visit: Payer: Self-pay

## 2023-02-05 ENCOUNTER — Other Ambulatory Visit: Payer: Self-pay

## 2023-02-05 NOTE — Progress Notes (Signed)
Specialty Pharmacy Refill Coordination Note  Molly Dillon is a 63 y.o. female contacted today regarding refills of specialty medication(s) Bictegravir-Emtricitab-Tenofov [name])   Patient requested Delivery   Delivery date: 02/09/23   Verified address: [address]   Medication will be filled on 01.27.25.

## 2023-02-08 ENCOUNTER — Other Ambulatory Visit: Payer: Self-pay

## 2023-02-15 ENCOUNTER — Other Ambulatory Visit (HOSPITAL_COMMUNITY): Payer: Self-pay

## 2023-02-17 ENCOUNTER — Other Ambulatory Visit (HOSPITAL_BASED_OUTPATIENT_CLINIC_OR_DEPARTMENT_OTHER): Payer: Self-pay

## 2023-02-19 ENCOUNTER — Telehealth: Payer: Self-pay

## 2023-02-19 NOTE — Telephone Encounter (Signed)
 Called patient unable to reach her, LVM to both numbers on file ending in ([phone]) and ([phone]) informing her to call the office back regarding the paperwork. Alain Howard CMA

## 2023-02-19 NOTE — Telephone Encounter (Signed)
-----   Message from Wilhemena Harbour sent at 02/17/2023  2:14 PM EST ----- Regarding: Call for Check up Hey yall,  This patient has paperwork they need completed for some medical supplies they need. I cannot fill out the form unless patient has had a visit in the last 6 months, documenting a need for the suppllies (incontinence supplies).   Can we get this patient scheduled for a check up, so we can get their supplies sent?  Thank you for your help!  -BS

## 2023-02-22 ENCOUNTER — Telehealth: Payer: Self-pay

## 2023-02-22 NOTE — Telephone Encounter (Signed)
 Called patient and appointment has been scheduled. Alain Howard CMA

## 2023-02-22 NOTE — Telephone Encounter (Signed)
-----   Message from Molly Dillon sent at 02/17/2023  2:14 PM EST ----- Regarding: Call for Check up Hey yall,  This patient has paperwork they need completed for some medical supplies they need. I cannot fill out the form unless patient has had a visit in the last 6 months, documenting a need for the suppllies (incontinence supplies).   Can we get this patient scheduled for a check up, so we can get their supplies sent?  Thank you for your help!  -BS

## 2023-02-24 ENCOUNTER — Ambulatory Visit: Payer: 59 | Admitting: Student

## 2023-02-24 NOTE — Patient Instructions (Incomplete)
It was great to see you! Thank you for allowing me to participate in your care!  I recommend that you always bring your medications to each appointment as this makes it easy to ensure we are on the correct medications and helps [place] not miss when refills are needed.  Our plans for today:  - Check up -   We are checking some labs today, I will call you if they are abnormal will send you a MyChart message or a letter if they are normal.  If you do not hear about your labs in the next 2 weeks please let us know.***  Take care and seek immediate care sooner if you develop any concerns.   Dr. Bess Kinds, MD [facility]

## 2023-02-24 NOTE — Progress Notes (Deleted)
    SUBJECTIVE:   Chief compliant/HPI: annual examination  Molly Dillon is a 63 y.o. who presents today for an annual exam.   Incontinence   History tabs reviewed and updated ***.   Review of systems form reviewed and notable for ***.   OBJECTIVE:   LMP 01/12/2011   ***  ASSESSMENT/PLAN:   No problem-specific Assessment & Plan notes found for this encounter.    Annual Examination  See AVS for age appropriate recommendations  PHQ score ***, reviewed and discussed.  BP reviewed and at goal ***.  Asked about intimate partner violence and resources given as appropriate  Advance directives discussion ***  Incontinence:   Considered the following items based upon USPSTF recommendations: Diabetes screening: discussed and ordered Screening for elevated cholesterol: discussed and ordered HIV testing:  Patient follows w/ ID given hx of HIV, on medication  Neg 09/02/22 Hepatitis C: discussed Neg 08/22/19 Hepatitis B: discussed Neg 08/22/19 Syphilis if at high risk: {discussed/ordered:14545} Neg 09/02/22 GC/CT {GC/CT screening :23818} Neg 09/02/22 Osteoporosis screening considered based upon risk of fracture from [facility] calculator. Major osteoporotic fracture risk is 8%. DEXA not ordered.  Reviewed risk factors for latent tuberculosis and {not indicated/requested/declined:14582}   Discussed family history, BRCA testing {not indicated/requested/declined:14582}. Tool used to risk stratify was ***.  Cervical cancer screening: prior Pap reviewed, repeat due in 7  months Breast cancer screening: {mammoscreen:23820} Colorectal cancer screening: up to date on screening for CRC. Lung cancer screening: {discussed/declined/written UJWJ:19147}. See documentation below regarding indications/risks/benefits.  Vaccinations ***.   Follow up in 1 *** year or sooner if indicated.    Bess Kinds, MD [facility]

## 2023-02-26 ENCOUNTER — Ambulatory Visit: Payer: 59 | Admitting: Student

## 2023-02-26 ENCOUNTER — Encounter: Payer: Self-pay | Admitting: Student

## 2023-02-26 VITALS — BP 120/70 | HR 91 | Ht 64.0 in | Wt 143.5 lb

## 2023-02-26 DIAGNOSIS — I10 Essential (primary) hypertension: Secondary | ICD-10-CM

## 2023-02-26 DIAGNOSIS — Z1231 Encounter for screening mammogram for malignant neoplasm of breast: Secondary | ICD-10-CM | POA: Diagnosis not present

## 2023-02-26 DIAGNOSIS — Z131 Encounter for screening for diabetes mellitus: Secondary | ICD-10-CM | POA: Diagnosis not present

## 2023-02-26 DIAGNOSIS — Z13220 Encounter for screening for lipoid disorders: Secondary | ICD-10-CM

## 2023-02-26 DIAGNOSIS — Z23 Encounter for immunization: Secondary | ICD-10-CM

## 2023-02-26 DIAGNOSIS — Z0000 Encounter for general adult medical examination without abnormal findings: Secondary | ICD-10-CM | POA: Diagnosis not present

## 2023-02-26 LAB — POCT GLYCOSYLATED HEMOGLOBIN (HGB A1C): Hemoglobin A1C: 6 % — AB (ref 4.0–5.6)

## 2023-02-26 NOTE — Assessment & Plan Note (Signed)
Patient comes in for annual physical exam.  Patient has no complaints, patient appreciates that she has bladder incontinence daily, multiple times a day, related to her kidney disease.  Patient reports this has been an issue for over a year.  Patient otherwise due for mammogram, and low-dose CT scan, as she has long history of smoking.  Patient to receive COVID-vaccine today.  Patient due for repeat Pap smear in 7 months. - Low-dose CT scan due - Mammogram due - Pap smear daily 7 months - A1c, BMP, lipid panel

## 2023-02-26 NOTE — Progress Notes (Signed)
    SUBJECTIVE:   Chief compliant/HPI: annual examination  Molly Dillon is a 63 y.o. who presents today for an annual exam.   History tabs reviewed and updated.   Review of systems form reviewed and notable for .   OBJECTIVE:   BP 120/70   Pulse 91   Ht 5\' 4"  (1.626 m)   Wt 143 lb 8 oz (65.1 kg)   LMP 01/12/2011   SpO2 93%   BMI 24.63 kg/m   Physical Exam Constitutional:      General: She is not in acute distress.    Appearance: Normal appearance. She is normal weight. She is not ill-appearing.  Cardiovascular:     Rate and Rhythm: Normal rate and regular rhythm.     Pulses: Normal pulses.     Heart sounds: Normal heart sounds. No murmur heard.    No friction rub. No gallop.  Pulmonary:     Effort: Pulmonary effort is normal. No respiratory distress.     Breath sounds: Normal breath sounds. No stridor. No wheezing, rhonchi or rales.  Abdominal:     General: Bowel sounds are normal. There is no distension.     Palpations: Abdomen is soft. There is no mass.     Tenderness: There is no abdominal tenderness. There is no guarding or rebound.     Hernia: No hernia is present.  Neurological:     Mental Status: She is alert.      ASSESSMENT/PLAN:   Annual physical exam Patient comes in for annual physical exam.  Patient has no complaints, patient appreciates that she has bladder incontinence daily, multiple times a day, related to her kidney disease.  Patient reports this has been an issue for over a year.  Patient otherwise due for mammogram, and low-dose CT scan, as she has long history of smoking.  Patient to receive COVID-vaccine today.  Patient due for repeat Pap smear in 7 months. - Low-dose CT scan due - Mammogram due - Pap smear daily 7 months - A1c, BMP, lipid panel    Annual Examination  See AVS for age appropriate recommendations  PHQ not completed, but mood is good, no signs of depression, BP reviewed and at goal.  Asked about intimate partner  violence and resources given as appropriate   Incontinence  Been an issue for over a year. Can't hold her urine. Needing supplies to help maintain. Is up multiple times a night for incontinence.   Considered the following items based upon USPSTF recommendations: Diabetes screening: discussed and ordered Screening for elevated cholesterol: discussed and ordered HIV testing: discussed Neg 09/02/22 Hepatitis C: discussed Neg 08/22/19 Hepatitis B: discussed Neg 08/22/19 Syphilis if at high risk:  discussed  Neg 09/02/22 GC/CT not at high risk and not ordered. Osteoporosis screening considered based upon risk of fracture from [facility] calculator. Major osteoporotic fracture risk is 8.5%. DEXA not ordered.  Reviewed risk factors for latent tuberculosis and not indicated   Discussed family history, BRCA testing not indicated.  Cervical cancer screening: prior Pap reviewed, repeat due in 7 months Breast cancer screening: discussed potential benefits, risks including overdiagnosis and biopsy, elected proceed with mammogram Colorectal cancer screening: up to date on screening for CRC. Lung cancer screening: discussed but patient wants to hold off for now. Vaccinations Covid given today.   Follow up in 1 year or sooner if indicated.    Bess Kinds, MD [facility]

## 2023-02-26 NOTE — Patient Instructions (Addendum)
It was great to see you! Thank you for allowing me to participate in your care!  I recommend that you always bring your medications to each appointment as this makes it easy to ensure we are on the correct medications and helps [place] not miss when refills are needed.  Our plans for today:  - Check up  Diabetes screen, Kidney function, Cholesterol check  - Health Maintenance  Due for Mammogram to screen for breast cancer  Due for Low Dose CT scan of chest to screen for lung cancer  Due for a Pap Smear in October, make follow up appointment  - Increased Thirst & Constipation  Make follow up appointment to investigate this  We are checking some labs today, I will call you if they are abnormal will send you a MyChart message or a letter if they are normal.  If you do not hear about your labs in the next 2 weeks please let us know.  Take care and seek immediate care sooner if you develop any concerns.   Dr. Bess Kinds, MD [facility]

## 2023-02-27 LAB — BASIC METABOLIC PANEL
BUN/Creatinine Ratio: 13 (ref 12–28)
BUN: 18 mg/dL (ref 8–27)
CO2: 26 mmol/L (ref 20–29)
Calcium: 9.2 mg/dL (ref 8.7–10.3)
Chloride: 101 mmol/L (ref 96–106)
Creatinine, Ser: 1.4 mg/dL — ABNORMAL HIGH (ref 0.57–1.00)
Glucose: 79 mg/dL (ref 70–99)
Potassium: 4.4 mmol/L (ref 3.5–5.2)
Sodium: 141 mmol/L (ref 134–144)
eGFR: 43 mL/min/{1.73_m2} — ABNORMAL LOW (ref >59)

## 2023-02-27 LAB — LIPID PANEL
Chol/HDL Ratio: 2.6 {ratio} (ref 0.0–4.4)
Cholesterol, Total: 171 mg/dL (ref 100–199)
HDL: 66 mg/dL (ref >39)
LDL Chol Calc (NIH): 90 mg/dL (ref 0–99)
Triglycerides: 79 mg/dL (ref 0–149)
VLDL Cholesterol Cal: 15 mg/dL (ref 5–40)

## 2023-03-01 ENCOUNTER — Encounter: Payer: Self-pay | Admitting: Student

## 2023-03-01 DIAGNOSIS — R7303 Prediabetes: Secondary | ICD-10-CM | POA: Insufficient documentation

## 2023-03-01 NOTE — Progress Notes (Unsigned)
error 

## 2023-03-02 ENCOUNTER — Ambulatory Visit
Admission: RE | Admit: 2023-03-02 | Discharge: 2023-03-02 | Disposition: A | Discharge status: Home / Self Care | Payer: Medicaid Other | Source: Ambulatory Visit | Attending: Family Medicine | Admitting: Family Medicine

## 2023-03-02 ENCOUNTER — Other Ambulatory Visit (HOSPITAL_COMMUNITY): Payer: Self-pay

## 2023-03-02 DIAGNOSIS — Z1231 Encounter for screening mammogram for malignant neoplasm of breast: Secondary | ICD-10-CM

## 2023-03-04 ENCOUNTER — Other Ambulatory Visit (HOSPITAL_COMMUNITY): Payer: Self-pay

## 2023-03-08 ENCOUNTER — Encounter: Payer: Self-pay | Admitting: Infectious Disease

## 2023-03-08 DIAGNOSIS — E785 Hyperlipidemia, unspecified: Secondary | ICD-10-CM | POA: Insufficient documentation

## 2023-03-09 ENCOUNTER — Ambulatory Visit: Payer: 59 | Admitting: Infectious Disease

## 2023-03-09 DIAGNOSIS — J4489 Other specified chronic obstructive pulmonary disease: Secondary | ICD-10-CM

## 2023-03-09 DIAGNOSIS — B20 Human immunodeficiency virus [HIV] disease: Secondary | ICD-10-CM

## 2023-03-09 DIAGNOSIS — N1832 Chronic kidney disease, stage 3b: Secondary | ICD-10-CM

## 2023-03-09 DIAGNOSIS — E785 Hyperlipidemia, unspecified: Secondary | ICD-10-CM

## 2023-03-09 DIAGNOSIS — I10 Essential (primary) hypertension: Secondary | ICD-10-CM

## 2023-03-12 ENCOUNTER — Other Ambulatory Visit: Payer: Self-pay

## 2023-03-12 NOTE — Progress Notes (Signed)
 Specialty Pharmacy Refill Coordination Note  Molly Dillon is a 63 y.o. female contacted today regarding refills of specialty medication(s) Bictegravir-Emtricitab-Tenofov [name])   Patient requested Delivery   Delivery date: 03/17/23   Verified address: [address]   Medication will be filled on 03/16/23.

## 2023-03-15 ENCOUNTER — Other Ambulatory Visit: Payer: Self-pay | Admitting: Student

## 2023-03-15 ENCOUNTER — Other Ambulatory Visit (HOSPITAL_COMMUNITY): Payer: Self-pay

## 2023-03-15 DIAGNOSIS — M545 Low back pain, unspecified: Secondary | ICD-10-CM

## 2023-03-15 DIAGNOSIS — I10 Essential (primary) hypertension: Secondary | ICD-10-CM

## 2023-03-16 ENCOUNTER — Other Ambulatory Visit: Payer: Self-pay

## 2023-03-16 ENCOUNTER — Ambulatory Visit: Payer: 59 | Admitting: Student

## 2023-03-16 ENCOUNTER — Other Ambulatory Visit (HOSPITAL_COMMUNITY): Payer: Self-pay

## 2023-03-16 MED ORDER — DULOXETINE HCL 30 MG PO CPEP
60.0000 mg | ORAL_CAPSULE | Freq: Every day | ORAL | 2 refills | Status: DC
Start: 2023-03-16 — End: 2023-06-19
  Filled 2023-03-16: qty 60, 30d supply, fill #0
  Filled 2023-04-26: qty 60, 30d supply, fill #1
  Filled 2023-05-21: qty 60, 30d supply, fill #2

## 2023-03-16 MED ORDER — BISOPROLOL-HYDROCHLOROTHIAZIDE 5-6.25 MG PO TABS
1.0000 | ORAL_TABLET | Freq: Every day | ORAL | 2 refills | Status: DC
  Filled 2023-03-16: qty 30, 30d supply, fill #0
  Filled 2023-04-26: qty 30, 30d supply, fill #1
  Filled 2023-05-21: qty 30, 30d supply, fill #2

## 2023-03-16 MED ORDER — TRELEGY ELLIPTA 100-62.5-25 MCG/ACT IN AEPB
1.0000 | INHALATION_SPRAY | Freq: Every day | RESPIRATORY_TRACT | 2 refills | Status: AC
  Filled 2023-03-16: qty 60, 30d supply, fill #0
  Filled 2023-08-02: qty 60, 30d supply, fill #1
  Filled 2023-11-10: qty 60, 30d supply, fill #2

## 2023-03-16 MED ORDER — AMLODIPINE BESYLATE 10 MG PO TABS
10.0000 mg | ORAL_TABLET | Freq: Every day | ORAL | 2 refills | Status: DC
  Filled 2023-03-16: qty 30, 30d supply, fill #0
  Filled 2023-04-26: qty 30, 30d supply, fill #1
  Filled 2023-05-21: qty 30, 30d supply, fill #2

## 2023-03-16 NOTE — Progress Notes (Deleted)
  SUBJECTIVE:   CHIEF COMPLAINT / HPI:   Prediabetes?   PERTINENT  PMH / PSH: ***  Past Medical History:  Diagnosis Date   Acute renal insufficiency 06/06/2014   Arthritis    Asthma    Constipation 07/19/2014   COPD (chronic obstructive pulmonary disease) with acute bronchitis (HCC) 06/06/2014   Depression    Depression, major, recurrent, moderate (HCC) 04/02/2015   Dyspnea    HIV infection (HCC)    Hyperlipidemia    Hyperlipidemia    Hypertension    Hypertensive nephropathy 07/19/2014   On home oxygen therapy    2L   Polycythemia 12/18/2020   Sleep apnea    Smoker 12/18/2020   Unintentional weight loss 02/13/2015   OBJECTIVE:  LMP 01/12/2011  ***  ASSESSMENT/PLAN:   Assessment & Plan  No follow-ups on file. Bess Kinds, MD 03/16/2023, 6:44 AM PGY-***, [facility] {    This will disappear when note is signed, click to select method of visit    :1}

## 2023-03-22 ENCOUNTER — Ambulatory Visit (INDEPENDENT_AMBULATORY_CARE_PROVIDER_SITE_OTHER): Admitting: Student

## 2023-03-22 ENCOUNTER — Other Ambulatory Visit (HOSPITAL_COMMUNITY): Payer: Self-pay

## 2023-03-22 ENCOUNTER — Encounter: Payer: Self-pay | Admitting: Student

## 2023-03-22 ENCOUNTER — Other Ambulatory Visit: Payer: Self-pay

## 2023-03-22 VITALS — BP 155/84 | HR 105 | Ht 64.0 in | Wt 147.0 lb

## 2023-03-22 DIAGNOSIS — R7303 Prediabetes: Secondary | ICD-10-CM

## 2023-03-22 DIAGNOSIS — N3946 Mixed incontinence: Secondary | ICD-10-CM | POA: Diagnosis not present

## 2023-03-22 DIAGNOSIS — R030 Elevated blood-pressure reading, without diagnosis of hypertension: Secondary | ICD-10-CM | POA: Insufficient documentation

## 2023-03-22 MED ORDER — METFORMIN HCL ER 500 MG PO TB24
500.0000 mg | ORAL_TABLET | Freq: Two times a day (BID) | ORAL | 1 refills | Status: DC
  Filled 2023-03-22: qty 60, 30d supply, fill #0

## 2023-03-22 NOTE — Assessment & Plan Note (Addendum)
 Patient comes in for follow-up of urinary incontinence.  Patient appreciates symptoms with coughing/sneezing, as well as when she has to use the bathroom, and concerning for mixed stress/urge incontinence.  Patient appreciates that started about a year ago, no precipitating factors or symptoms.  Patient does smoke, is prediabetic, mainly drinks water.  Will recommend patient try Kegel exercises and pelvic floor PT.  Will have patient follow-up in few months. - Pelvic floor PT - Kegel exercises - Follow-up 3 months

## 2023-03-22 NOTE — Patient Instructions (Addendum)
 It was great to see you! Thank you for allowing me to participate in your care!  I recommend that you always bring your medications to each appointment as this makes it easy to ensure we are on the correct medications and helps [place] not miss when refills are needed.  Our plans for today:  - Urinary Incontinence  I'm going to send you to Pelvic Floor PT and have you practice kegel exercises.  Collecting Urine sample F/u in 3 months   We are checking some labs today, I will call you if they are abnormal will send you a MyChart message or a letter if they are normal.  If you do not hear about your labs in the next 2 weeks please let us know.  Take care and seek immediate care sooner if you develop any concerns.   Dr. Bess Kinds, MD [facility]   Kegel Exercises  Kegel exercises can help strengthen your pelvic floor muscles. The pelvic floor is a group of muscles that support your rectum, small intestine, and bladder. In females, pelvic floor muscles also help support the uterus. These muscles help you control the flow of urine and stool (feces). Kegel exercises are painless and simple. They do not require any equipment. Your provider may suggest Kegel exercises to: Improve bladder and bowel control. Improve sexual response. Improve weak pelvic floor muscles after surgery to remove the uterus (hysterectomy) or after pregnancy, in females. Improve weak pelvic floor muscles after prostate gland removal or surgery, in males. Kegel exercises involve squeezing your pelvic floor muscles. These are the same muscles you squeeze when you try to stop the flow of urine or keep from passing gas. The exercises can be done while sitting, standing, or lying down, but it is best to vary your position. Ask your health care provider which exercises are safe for you. Do exercises exactly as told by your health care provider and adjust them as directed. Do not begin these exercises until told by your  health care provider. Exercises How to do Kegel exercises: Squeeze your pelvic floor muscles tight. You should feel a tight lift in your rectal area. If you are a female, you should also feel a tightness in your vaginal area. Keep your stomach, buttocks, and legs relaxed. Hold the muscles tight for up to 10 seconds. Breathe normally. Relax your muscles for up to 10 seconds. Repeat as told by your health care provider. Repeat this exercise daily as told by your health care provider. Continue to do this exercise for at least 4-6 weeks, or for as long as told by your health care provider. You may be referred to a physical therapist who can help you learn more about how to do Kegel exercises. Depending on your condition, your health care provider may recommend: Varying how long you squeeze your muscles. Doing several sets of exercises every day. Doing exercises for several weeks. Making Kegel exercises a part of your regular exercise routine. This information is not intended to replace advice given to you by your health care provider. Make sure you discuss any questions you have with your health care provider. Document Revised: 05/09/2020 Document Reviewed: 05/09/2020 Elsevier Patient Education  2024 [employer].

## 2023-03-22 NOTE — Progress Notes (Signed)
  SUBJECTIVE:   CHIEF COMPLAINT / HPI:   Mixed Urinary Incontinence Will have urine leak when coughing and laughing. When she has urge to go, she has to go right then, or she will have an accident. No dysuria, but will pee often at night 10x or more. Hasn't seen anybody for it, and has just been dealing with it. Notes she is more thirsty in the evening and drinking a lot before bed. Has been going on for about a year. Patient has smoking hx and prediabetes  PERTINENT  PMH / PSH:    OBJECTIVE:  BP (!) 155/84   Pulse (!) 105   Ht 5\' 4"  (1.626 m)   Wt 147 lb (66.7 kg)   LMP 01/12/2011   SpO2 93%   BMI 25.23 kg/m   Physical Exam Constitutional:      General: She is not in acute distress.    Appearance: Normal appearance. She is not ill-appearing.  Cardiovascular:     Rate and Rhythm: Normal rate and regular rhythm.     Pulses: Normal pulses.     Heart sounds: Normal heart sounds. No murmur heard.    No friction rub. No gallop.  Pulmonary:     Effort: Pulmonary effort is normal. No respiratory distress.     Breath sounds: Normal breath sounds. No stridor. No wheezing, rhonchi or rales.  Abdominal:     General: There is no distension.     Palpations: Abdomen is soft. There is no mass.     Tenderness: There is no abdominal tenderness. There is no guarding or rebound.     Hernia: No hernia is present.  Neurological:     Mental Status: She is alert.  Psychiatric:        Mood and Affect: Mood normal.      ASSESSMENT/PLAN:   Assessment & Plan Mixed stress and urge urinary incontinence Patient comes in for follow-up of urinary incontinence.  Patient appreciates symptoms with coughing/sneezing, as well as when she has to use the bathroom, and concerning for mixed stress/urge incontinence.  Patient appreciates that started about a year ago, no precipitating factors or symptoms.  Patient does smoke, is prediabetic, mainly drinks water.  Will recommend patient try Kegel exercises and  pelvic floor PT.  Will have patient follow-up in few months. - Pelvic floor PT - Kegel exercises - Follow-up 3 months Prediabetes Patient comes in for follow-up of prediabetes.  Patient wanting to start medication today.  Will start metformin at low-dose.  Follow-up 3 months. - Metformin 500 mg every morning x 3 to 5 days - Then Metformin 500 mg twice daily - Follow-up 3 months Elevated blood pressure reading Patient noted to have elevated blood pressure today, will have her follow-up closely for blood pressure recheck - Follow-up 2 weeks No follow-ups on file. Bess Kinds, MD 03/22/2023, 2:19 PM PGY-3, [facility]

## 2023-03-22 NOTE — Assessment & Plan Note (Addendum)
 Patient comes in for follow-up of prediabetes.  Patient wanting to start medication today.  Will start metformin at low-dose.  Follow-up 3 months. - Metformin 500 mg every morning x 3 to 5 days - Then Metformin 500 mg twice daily - Follow-up 3 months

## 2023-03-22 NOTE — Assessment & Plan Note (Addendum)
 Patient noted to have elevated blood pressure today, will have her follow-up closely for blood pressure recheck - Follow-up 2 weeks

## 2023-03-23 ENCOUNTER — Encounter: Payer: Self-pay | Admitting: Student

## 2023-03-23 LAB — URINALYSIS
Bilirubin, UA: NEGATIVE
Glucose, UA: NEGATIVE
Ketones, UA: NEGATIVE
Leukocytes,UA: NEGATIVE
Nitrite, UA: NEGATIVE
RBC, UA: NEGATIVE
Specific Gravity, UA: 1.018 (ref 1.005–1.030)
Urobilinogen, Ur: 0.2 mg/dL (ref 0.2–1.0)
pH, UA: 6.5 (ref 5.0–7.5)

## 2023-04-01 ENCOUNTER — Encounter: Payer: Self-pay | Admitting: Student

## 2023-04-04 ENCOUNTER — Telehealth: Payer: Self-pay | Admitting: Student

## 2023-04-04 NOTE — Telephone Encounter (Signed)
 Called to inquire why patient needed referral to pulmonologist and what all was going on. Left VM stating I would call back today or Monday.

## 2023-04-06 ENCOUNTER — Telehealth: Payer: Self-pay | Admitting: Student

## 2023-04-06 NOTE — Telephone Encounter (Signed)
 Discussed need for pulmonologist with patient.   Patient notes that she's been short of breath for the last month.  Patient was reviewing this with her pain management doctor, who had recommended referring her to a pulmonologist.    Patient was dissatisfied as the pulmonologist was out of town, and patient wants to see a pulmonologist in [place].    Discussed with patient that we have never discussed breathing issues or lung issues before.  I told patient that I would like her to follow-up in clinic so we could investigate these breathing issues.  And would also place a referral to pulmonologist if need. Patient in agreement.

## 2023-04-07 ENCOUNTER — Ambulatory Visit: Payer: Medicaid Other | Admitting: Infectious Disease

## 2023-04-12 ENCOUNTER — Other Ambulatory Visit: Payer: Self-pay

## 2023-04-12 ENCOUNTER — Other Ambulatory Visit: Payer: Self-pay | Admitting: Pharmacy Technician

## 2023-04-12 NOTE — Progress Notes (Signed)
 Specialty Pharmacy Refill Coordination Note  JUNITA KUBOTA is a 63 y.o. female contacted today regarding refills of specialty medication(s) Bictegravir-Emtricitab-Tenofov [name])   Patient requested Delivery   Delivery date: 04/20/23   Verified address: [address]  [place]   Medication will be filled on 04/19/23.

## 2023-04-13 ENCOUNTER — Ambulatory Visit: Admitting: Infectious Disease

## 2023-04-13 ENCOUNTER — Other Ambulatory Visit (HOSPITAL_COMMUNITY)
Admission: RE | Admit: 2023-04-13 | Discharge: 2023-04-13 | Disposition: A | Discharge status: Home / Self Care | Source: Ambulatory Visit | Attending: Infectious Disease | Admitting: Infectious Disease

## 2023-04-13 ENCOUNTER — Other Ambulatory Visit: Payer: Self-pay

## 2023-04-13 ENCOUNTER — Other Ambulatory Visit (HOSPITAL_COMMUNITY): Payer: Self-pay

## 2023-04-13 ENCOUNTER — Encounter: Payer: Self-pay | Admitting: Infectious Disease

## 2023-04-13 VITALS — BP 128/85 | HR 97 | Resp 16 | Ht 64.0 in | Wt 146.0 lb

## 2023-04-13 DIAGNOSIS — R634 Abnormal weight loss: Secondary | ICD-10-CM | POA: Diagnosis not present

## 2023-04-13 DIAGNOSIS — I10 Essential (primary) hypertension: Secondary | ICD-10-CM | POA: Diagnosis not present

## 2023-04-13 DIAGNOSIS — F3341 Major depressive disorder, recurrent, in partial remission: Secondary | ICD-10-CM

## 2023-04-13 DIAGNOSIS — E785 Hyperlipidemia, unspecified: Secondary | ICD-10-CM

## 2023-04-13 DIAGNOSIS — E1165 Type 2 diabetes mellitus with hyperglycemia: Secondary | ICD-10-CM

## 2023-04-13 DIAGNOSIS — Z23 Encounter for immunization: Secondary | ICD-10-CM | POA: Diagnosis not present

## 2023-04-13 DIAGNOSIS — R630 Anorexia: Secondary | ICD-10-CM

## 2023-04-13 DIAGNOSIS — B20 Human immunodeficiency virus [HIV] disease: Secondary | ICD-10-CM | POA: Diagnosis not present

## 2023-04-13 DIAGNOSIS — N1832 Chronic kidney disease, stage 3b: Secondary | ICD-10-CM

## 2023-04-13 DIAGNOSIS — Z7185 Encounter for immunization safety counseling: Secondary | ICD-10-CM

## 2023-04-13 MED ORDER — BIKTARVY 50-200-25 MG PO TABS
1.0000 | ORAL_TABLET | Freq: Every day | ORAL | 11 refills | Status: AC
  Filled 2023-04-13: qty 30, 30d supply, fill #0
  Filled 2023-05-12: qty 30, 30d supply, fill #1
  Filled 2023-06-09: qty 30, 30d supply, fill #2
  Filled 2023-07-15: qty 30, 30d supply, fill #3
  Filled 2023-08-04 – 2023-08-09 (×2): qty 30, 30d supply, fill #4
  Filled 2023-09-14: qty 30, 30d supply, fill #5
  Filled 2023-09-15 – 2023-10-10 (×2): qty 30, 30d supply, fill #6
  Filled 2023-11-09: qty 30, 30d supply, fill #7
  Filled 2023-12-10: qty 30, 30d supply, fill #8

## 2023-04-13 NOTE — Progress Notes (Signed)
 Subjective:  Chief complaint: follow-up for HIV disease on medications   Patient ID: Molly Dillon, female    DOB: 09/07/1960, 63 y.o.   MRN: [id number]  HPI   Discussed the use of AI scribe software for clinical note transcription with the patient, who gave verbal consent to proceed.  History of Present Illness   The patient, with a history of diabetes, HIV, asthma, and hypertension, presents with complaints of diarrhea, headaches, and body aches since starting metformin 500mg  XL twice daily. She reports that the medication is making her feel 'miserable.' She is also on Biktarvy for HIV, which is well controlled with a non-detectable viral load.  The patient has been experiencing weight loss and a decrease in appetite for about a year. She denies having COVID-19 and is up-to-date on cancer screenings, including a recent colonoscopy and mammogram. She also reports taking Cymbalta, possibly for both depression and pain management.  In addition to these issues, the patient has asthma and is on albuterol and Airsupra inhalers. She also has hypertension, managed with amlodipine, bisoprolol, and hydrochlorothiazide. For cholesterol, she is on atorvastatin.       Past Medical History:  Diagnosis Date   Acute renal insufficiency 06/06/2014   Arthritis    Asthma    Constipation 07/19/2014   COPD (chronic obstructive pulmonary disease) with acute bronchitis (HCC) 06/06/2014   Depression    Depression, major, recurrent, moderate (HCC) 04/02/2015   Dyspnea    HIV infection (HCC)    Hyperlipidemia    Hyperlipidemia    Hypertension    Hypertensive nephropathy 07/19/2014   On home oxygen therapy    2L   Polycythemia 12/18/2020   Sleep apnea    Smoker 12/18/2020   Unintentional weight loss 02/13/2015    Past Surgical History:  Procedure Laterality Date   BIOPSY  09/21/2019   Procedure: BIOPSY;  Surgeon: Lemar Lofty., MD;  Location: [facility];  Service: Gastroenterology;;    COLONOSCOPY WITH PROPOFOL N/A 09/21/2019   Procedure: COLONOSCOPY WITH PROPOFOL;  Surgeon: Lemar Lofty., MD;  Location: [facility];  Service: Gastroenterology;  Laterality: N/A;   ESOPHAGOGASTRODUODENOSCOPY (EGD) WITH PROPOFOL N/A 09/21/2019   Procedure: ESOPHAGOGASTRODUODENOSCOPY (EGD) WITH PROPOFOL;  Surgeon: Meridee Score Netty Starring., MD;  Location: [facility];  Service: Gastroenterology;  Laterality: N/A;    Family History  Problem Relation Age of Onset   Diabetes Mother    Healthy Father    Diabetes Sister    Diabetes Sister    Pancreatic cancer Paternal Grandmother 73   Colon cancer Neg Hx    Stomach cancer Neg Hx    Esophageal cancer Neg Hx    Inflammatory bowel disease Neg Hx    Liver disease Neg Hx    Rectal cancer Neg Hx       Social History   Socioeconomic History   Marital status: Married    Spouse name: Not on file   Number of children: Not on file   Years of education: Not on file   Highest education level: GED or equivalent  Occupational History   Not on file  Tobacco Use   Smoking status: Former    Current packs/day: 0.00    Average packs/day: 0.5 packs/day for 38.0 years (19.0 ttl pk-yrs)    Types: Cigarettes    Start date: 01/13/1980    Quit date: 01/03/2018    Years since quitting: 5.2   Smokeless tobacco: Never   Tobacco comments:    down to 5 cigs a  day  Vaping Use   Vaping status: Never Used  Substance and Sexual Activity   Alcohol use: No    Alcohol/week: 0.0 standard drinks of alcohol   Drug use: No   Sexual activity: Not Currently    Partners: Male    Birth control/protection: None    Comment: given condoms  Other Topics Concern   Not on file  Social History Narrative   Not on file   Social Drivers of Health   Financial Resource Strain: Low Risk  (02/25/2023)   Overall Financial Resource Strain (CARDIA)    Difficulty of Paying Living Expenses: Not hard at all  Food Insecurity: No Food Insecurity (02/25/2023)   Hunger  Vital Sign    Worried About Running Out of Food in the Last Year: Never true    Ran Out of Food in the Last Year: Never true  Transportation Needs: No Transportation Needs (02/25/2023)   PRAPARE - Administrator, Civil Service (Medical): No    Lack of Transportation (Non-Medical): No  Physical Activity: Insufficiently Active (02/25/2023)   Exercise Vital Sign    Days of Exercise per Week: 3 days    Minutes of Exercise per Session: 30 min  Stress: No Stress Concern Present (02/25/2023)   [employer] of Occupational Health - Occupational Stress Questionnaire    Feeling of Stress : Only a little  Social Connections: Socially Integrated (02/25/2023)   Social Connection and Isolation Panel [NHANES]    Frequency of Communication with Friends and Family: More than three times a week    Frequency of Social Gatherings with Friends and Family: More than three times a week    Attends Religious Services: More than 4 times per year    Active Member of [employer] or Organizations: Yes    Attends Engineer, structural: More than 4 times per year    Marital Status: Married    Allergies  Allergen Reactions   Dapsone Shortness Of Breath   Penicillins Anaphylaxis    Has patient had a PCN reaction causing immediate rash, facial/tongue/throat swelling, SOB or lightheadedness with hypotension: Yes Has patient had a PCN reaction causing severe rash involving mucus membranes or skin necrosis: Yes Has patient had a PCN reaction that required hospitalization: No Has patient had a PCN reaction occurring within the last 10 years: No If all of the above answers are "NO", then may proceed with Cephalosporin use.      Current Outpatient Medications:    AIRSUPRA 90-80 MCG/ACT AERO, SMARTSIG:2 inhalation Via Inhaler Every 4 Hours PRN, Disp: , Rfl:    albuterol (VENTOLIN HFA) 108 (90 Base) MCG/ACT inhaler, Inhale 2 puffs into the lungs every 6 (six) hours as needed for wheezing., Disp: 18 g,  Rfl: 11   amLODipine (NORVASC) 10 MG tablet, Take 1 tablet (10 mg total) by mouth daily. Schedule appointment for check up, Disp: 30 tablet, Rfl: 2   atorvastatin (LIPITOR) 40 MG tablet, Take 1 tablet (40 mg total) by mouth daily as needed. MAKE PCP APPOINTMENT, Disp: 30 tablet, Rfl: 4   bisoprolol-hydrochlorothiazide (ZIAC) 5-6.25 MG tablet, Take 1 tablet by mouth daily., Disp: 30 tablet, Rfl: 2   bisoprolol-hydrochlorothiazide (ZIAC) 5-6.25 MG tablet, Take 1 tablet by mouth daily., Disp: 30 tablet, Rfl: 2   cholecalciferol (VITAMIN D3) 10 MCG (400 UNIT) TABS tablet, Take 400 Units by mouth in the morning and at bedtime., Disp: , Rfl:    DULERA 200-5 MCG/ACT AERO, INHALE 2 PUFFS INTO THE LUNGS TWICE  DAILY, Disp: 13 g, Rfl: 5   DULoxetine (CYMBALTA) 30 MG capsule, Take 2 capsules (60 mg total) by mouth daily., Disp: 60 capsule, Rfl: 2   LINZESS 145 MCG CAPS capsule, Take 145 mcg by mouth daily as needed (constipation). , Disp: , Rfl:    LINZESS 290 MCG CAPS capsule, Take 290 mcg by mouth daily., Disp: , Rfl:    metFORMIN (GLUCOPHAGE-XR) 500 MG 24 hr tablet, Take 1 tablet (500 mg total) by mouth 2 (two) times daily with a meal., Disp: 60 tablet, Rfl: 1   methocarbamol (ROBAXIN) 750 MG tablet, Take 750 mg by mouth 3 (three) times daily as needed for muscle spasms. , Disp: , Rfl:    NARCAN 4 MG/0.1ML LIQD nasal spray kit, Place 1 spray into the nose as needed (accidental overdose). , Disp: , Rfl:    oxyCODONE-acetaminophen (PERCOCET) 10-325 MG tablet, Take 1 tablet by mouth every 4 (four) hours as needed for pain., Disp: 30 tablet, Rfl: 0   pravastatin (PRAVACHOL) 20 MG tablet, Take 1 tablet (20 mg total) by mouth daily., Disp: 90 tablet, Rfl: 3   TRELEGY ELLIPTA 100-62.5-25 MCG/ACT AEPB, Take 1 puff by mouth daily., Disp: 60 each, Rfl: 2   bictegravir-emtricitabine-tenofovir AF (BIKTARVY) 50-200-25 MG TABS tablet, TAKE 1 TABLET BY MOUTH DAILY., Disp: 30 tablet, Rfl: 11   omeprazole (PRILOSEC) 20 MG  capsule, Take 2 capsules (40 mg total) by mouth 2 (two) times daily before a meal., Disp: 60 capsule, Rfl: 4  Current Facility-Administered Medications:    fluticasone furoate-vilanterol (BREO ELLIPTA) 100-25 MCG/INH 1 puff, 1 puff, Inhalation, Daily, Autry-Lott, Simone, DO    Review of Systems  Constitutional:  Negative for activity change, appetite change, chills, diaphoresis, fatigue, fever and unexpected weight change.  HENT:  Negative for congestion, rhinorrhea, sinus pressure, sneezing, sore throat and trouble swallowing.   Eyes:  Negative for photophobia and visual disturbance.  Respiratory:  Negative for cough, chest tightness, shortness of breath, wheezing and stridor.   Cardiovascular:  Negative for chest pain, palpitations and leg swelling.  Gastrointestinal:  Negative for abdominal distention, abdominal pain, anal bleeding, blood in stool, constipation, diarrhea, nausea and vomiting.  Genitourinary:  Negative for difficulty urinating, dysuria, flank pain and hematuria.  Musculoskeletal:  Negative for arthralgias, back pain, gait problem, joint swelling and myalgias.  Skin:  Negative for color change, pallor, rash and wound.  Neurological:  Negative for dizziness, tremors, weakness and light-headedness.  Hematological:  Negative for adenopathy. Does not bruise/bleed easily.  Psychiatric/Behavioral:  Negative for agitation, behavioral problems, confusion, decreased concentration, dysphoric mood and sleep disturbance.        Objective:   Physical Exam Constitutional:      General: She is not in acute distress.    Appearance: Normal appearance. She is well-developed. She is not ill-appearing or diaphoretic.  HENT:     Head: Normocephalic and atraumatic.     Right Ear: Hearing and external ear normal.     Left Ear: Hearing and external ear normal.     Nose: No nasal deformity or rhinorrhea.  Eyes:     General: No scleral icterus.    Conjunctiva/sclera: Conjunctivae normal.      Right eye: Right conjunctiva is not injected.     Left eye: Left conjunctiva is not injected.     Pupils: Pupils are equal, round, and reactive to light.  Neck:     Vascular: No JVD.  Cardiovascular:     Rate and Rhythm: Normal rate and regular  rhythm.     Heart sounds: Normal heart sounds, S1 normal and S2 normal. No murmur heard.    No friction rub.  Abdominal:     General: Bowel sounds are normal. There is no distension.     Palpations: Abdomen is soft.     Tenderness: There is no abdominal tenderness.  Musculoskeletal:        General: Normal range of motion.     Right shoulder: Normal.     Left shoulder: Normal.     Cervical back: Normal range of motion and neck supple.     Right hip: Normal.     Left hip: Normal.     Right knee: Normal.     Left knee: Normal.  Lymphadenopathy:     Head:     Right side of head: No submandibular, preauricular or posterior auricular adenopathy.     Left side of head: No submandibular, preauricular or posterior auricular adenopathy.     Cervical: No cervical adenopathy.     Right cervical: No superficial or deep cervical adenopathy.    Left cervical: No superficial or deep cervical adenopathy.  Skin:    General: Skin is warm and dry.     Coloration: Skin is not pale.     Findings: No abrasion, bruising, ecchymosis, erythema, lesion or rash.     Nails: There is no clubbing.  Neurological:     General: No focal deficit present.     Mental Status: She is alert and oriented to person, place, and time.     Sensory: No sensory deficit.     Coordination: Coordination normal.     Gait: Gait normal.  Psychiatric:        Attention and Perception: She is attentive.        Mood and Affect: Mood normal.        Speech: Speech normal.        Behavior: Behavior normal. Behavior is cooperative.        Thought Content: Thought content normal.        Judgment: Judgment normal.           Assessment & Plan:   Assessment and Plan    HIV  infection Her HIV is well-controlled on Biktarvy with an undetectable viral load as of August. She adheres to her medication regimen and manages her condition effectively. She is a potential candidate for a research study involving a new HIV medication regimen with once-weekly dosing regimen vs continued Biktarvy with Biktarvy arm being able to make a "late switch to once weekly Islatrravir/NNRTI combination. Participation includes free medications, labs, and compensation for visits. - Order HIV viral load and CD4 count. - Discuss potential participation in a research study for a new HIV medication regimen involving once-weekly dosing.  Metformin intolerance She experiences diarrhea, headaches, and generalized body pain, likely due to metformin. She is on 500 mg of extended-release metformin twice daily. Biktarvy may increase metformin levels, exacerbating side effects. Alternative diabetes medications should be considered. - Discontinue metformin and consult primary care physician for alternative diabetes management options.  Unintentional weight loss She reports unintentional weight loss over the past year, attributed to poor appetite. Recent colonoscopy in 2021 was normal, and she is up to date on mammograms. The cause of the weight loss is unclear, and further evaluation may be needed. - Discuss with primary care physician for further evaluation of weight loss and appetite issues.  Asthma She uses albuterol and Airsupra inhalers for management.  Hypertension She is on amlodipine, bisoprolol, and hydrochlorothiazide for blood pressure management.  Hyperlipidemia She is on atorvastatin (Lipitor) for cholesterol management.  Depression and/or pain She is on duloxetine (Cymbalta), which may be used for depression and/or pain management.  Vaccination She is due for a pneumonia vaccination, as it has been five years since her last one. She is up to date on flu and COVID vaccinations. -  Administer pneumonia vaccination.  Follow-up She is to continue regular follow-up every six months for her HIV management. Participation in the research study may alter the frequency of visits and lab requirements. - Schedule follow-up appointment in six months. - Coordinate with research team regarding potential study participation.

## 2023-04-13 NOTE — Addendum Note (Signed)
 Addended by: Clayborne Artist A on: 04/13/2023 01:00 PM   Modules accepted: Orders

## 2023-04-14 LAB — URINE CYTOLOGY ANCILLARY ONLY
Chlamydia: NEGATIVE
Comment: NEGATIVE
Comment: NORMAL
Neisseria Gonorrhea: NEGATIVE

## 2023-04-14 LAB — T-HELPER CELLS (CD4) COUNT (NOT AT ARMC)
CD4 % Helper T Cell: 23 % — ABNORMAL LOW (ref 33–65)
CD4 T Cell Abs: 474 /uL (ref 400–1790)

## 2023-04-15 LAB — LIPID PANEL
Cholesterol: 188 mg/dL (ref <200)
HDL: 71 mg/dL (ref >=50)
LDL Cholesterol (Calc): 98 mg/dL
Non-HDL Cholesterol (Calc): 117 mg/dL (ref <130)
Total CHOL/HDL Ratio: 2.6 (calc) (ref <5.0)
Triglycerides: 97 mg/dL (ref <150)

## 2023-04-15 LAB — COMPLETE METABOLIC PANEL WITHOUT GFR
AG Ratio: 1.4 (calc) (ref 1.0–2.5)
ALT: 22 U/L (ref 6–29)
AST: 22 U/L (ref 10–35)
Albumin: 4.6 g/dL (ref 3.6–5.1)
Alkaline phosphatase (APISO): 132 U/L (ref 37–153)
BUN/Creatinine Ratio: 15 (calc) (ref 6–22)
BUN: 24 mg/dL (ref 7–25)
CO2: 30 mmol/L (ref 20–32)
Calcium: 9.5 mg/dL (ref 8.6–10.4)
Chloride: 100 mmol/L (ref 98–110)
Creat: 1.57 mg/dL — ABNORMAL HIGH (ref 0.50–1.05)
Globulin: 3.3 g/dL (ref 1.9–3.7)
Glucose, Bld: 96 mg/dL (ref 65–99)
Potassium: 4.1 mmol/L (ref 3.5–5.3)
Sodium: 139 mmol/L (ref 135–146)
Total Bilirubin: 0.6 mg/dL (ref 0.2–1.2)
Total Protein: 7.9 g/dL (ref 6.1–8.1)

## 2023-04-15 LAB — CBC WITH DIFFERENTIAL/PLATELET
Absolute Lymphocytes: 2277 {cells}/uL (ref 850–3900)
Absolute Monocytes: 301 {cells}/uL (ref 200–950)
Basophils Absolute: 41 {cells}/uL (ref 0–200)
Basophils Relative: 0.7 %
Eosinophils Absolute: 100 {cells}/uL (ref 15–500)
Eosinophils Relative: 1.7 %
HCT: 50.2 % — ABNORMAL HIGH (ref 35.0–45.0)
Hemoglobin: 16.8 g/dL — ABNORMAL HIGH (ref 11.7–15.5)
MCH: 31.9 pg (ref 27.0–33.0)
MCHC: 33.5 g/dL (ref 32.0–36.0)
MCV: 95.3 fL (ref 80.0–100.0)
MPV: 9.2 fL (ref 7.5–12.5)
Monocytes Relative: 5.1 %
Neutro Abs: 3180 {cells}/uL (ref 1500–7800)
Neutrophils Relative %: 53.9 %
Platelets: 274 10*3/uL (ref 140–400)
RBC: 5.27 10*6/uL — ABNORMAL HIGH (ref 3.80–5.10)
RDW: 13 % (ref 11.0–15.0)
Total Lymphocyte: 38.6 %
WBC: 5.9 10*3/uL (ref 3.8–10.8)

## 2023-04-15 LAB — HIV-1 RNA QUANT-NO REFLEX-BLD
HIV 1 RNA Quant: NOT DETECTED {copies}/mL
HIV-1 RNA Quant, Log: NOT DETECTED {Log_copies}/mL

## 2023-04-15 LAB — RPR: RPR Ser Ql: NONREACTIVE

## 2023-04-19 ENCOUNTER — Other Ambulatory Visit: Payer: Self-pay

## 2023-04-26 ENCOUNTER — Other Ambulatory Visit (HOSPITAL_COMMUNITY): Payer: Self-pay

## 2023-04-26 ENCOUNTER — Other Ambulatory Visit: Payer: Self-pay | Admitting: Infectious Disease

## 2023-04-26 MED ORDER — ATORVASTATIN CALCIUM 40 MG PO TABS
40.0000 mg | ORAL_TABLET | Freq: Every day | ORAL | 4 refills | Status: DC | PRN
  Filled 2023-04-26: qty 30, 30d supply, fill #0
  Filled 2023-05-21: qty 30, 30d supply, fill #1
  Filled 2023-06-19: qty 30, 30d supply, fill #2
  Filled 2023-07-15: qty 30, 30d supply, fill #3
  Filled 2023-08-19: qty 30, 30d supply, fill #4

## 2023-05-12 ENCOUNTER — Other Ambulatory Visit (HOSPITAL_COMMUNITY): Payer: Self-pay

## 2023-05-12 ENCOUNTER — Other Ambulatory Visit: Payer: Self-pay

## 2023-05-12 NOTE — Progress Notes (Signed)
 Specialty Pharmacy Refill Coordination Note  Molly Dillon is a 63 y.o. female contacted today regarding refills of specialty medication(s) Bictegravir-Emtricitab-Tenofov (Biktarvy )   Patient requested Delivery   Delivery date: 05/19/23   Verified address: [address]   [place] [address]   Medication will be filled on 05/18/2023.

## 2023-05-12 NOTE — Progress Notes (Signed)
 Specialty Pharmacy Ongoing Clinical Assessment Note  Molly Dillon is a 63 y.o. female who is being followed by the specialty pharmacy service for RxSp HIV   Patient's specialty medication(s) reviewed today: Bictegravir-Emtricitab-Tenofov (Biktarvy )   Missed doses in the last 4 weeks: 0   Patient/Caregiver did not have any additional questions or concerns.   Therapeutic benefit summary: Patient is achieving benefit   Adverse events/side effects summary: No adverse events/side effects   Patient's therapy is appropriate to: Continue    Goals Addressed             This Visit's Progress    Achieve Undetectable HIV Viral Load < 20   On track    Patient is on track. Patient will maintain adherence.  Viral load remains undetectable as of 04/13/23.         Follow up:  6 months  [facility]

## 2023-05-27 ENCOUNTER — Ambulatory Visit

## 2023-05-27 VITALS — Ht 64.0 in | Wt 143.0 lb

## 2023-05-27 DIAGNOSIS — Z0000 Encounter for general adult medical examination without abnormal findings: Secondary | ICD-10-CM

## 2023-05-27 NOTE — Progress Notes (Signed)
 Because this visit was a virtual/telehealth visit,  certain criteria was not obtained, such a blood pressure, CBG if applicable, and timed get up and go. Any medications not marked as "taking" were not mentioned during the medication reconciliation part of the visit. Any vitals not documented were not able to be obtained due to this being a telehealth visit or patient was unable to self-report a recent blood pressure reading due to a lack of equipment at home via telehealth. Vitals that have been documented are verbally provided by the patient.   Subjective:   Molly Dillon is a 63 y.o. who presents for a Medicare Wellness preventive visit.  As a reminder, Annual Wellness Visits don't include a physical exam, and some assessments may be limited, especially if this visit is performed virtually. We may recommend an in-person visit if needed.  Visit Complete: Virtual I connected with  Lorne Rosebush on 05/27/23 by a audio enabled telemedicine application and verified that I am speaking with the correct person using two identifiers.  Patient Location: Home  Provider Location: Office/Clinic  I discussed the limitations of evaluation and management by telemedicine. The patient expressed understanding and agreed to proceed.  Vital Signs: Because this visit was a virtual/telehealth visit, some criteria may be missing or patient reported. Any vitals not documented were not able to be obtained and vitals that have been documented are patient reported.  VideoDeclined- This patient declined Librarian, academic. Therefore the visit was completed with audio only.  Persons Participating in Visit: Patient.  AWV Questionnaire: Yes: Patient Medicare AWV questionnaire was completed by the patient on 05/26/2023; I have confirmed that all information answered by patient is correct and no changes since this date.  Cardiac Risk Factors include: advanced age (>65men, >37  women);dyslipidemia;hypertension     Objective:     Today's Vitals   05/27/23 1633  Weight: 143 lb (64.9 kg)  Height: 5\' 4"  (1.626 m)  PainSc: 0-No pain   Body mass index is 24.55 kg/m.     05/27/2023    4:35 PM 02/26/2023    2:54 PM 09/21/2019    9:04 AM 05/30/2019   10:56 AM 01/16/2019    8:04 AM 11/08/2017    9:32 AM 09/28/2017    4:09 PM  Advanced Directives  Does Patient Have a Medical Advance Directive? No No No No No No No  Would patient like information on creating a medical advance directive? No - Patient declined No - Patient declined No - Patient declined No - Patient declined No - Patient declined No - Patient declined No - Patient declined    Current Medications (verified) Outpatient Encounter Medications as of 05/27/2023  Medication Sig   AIRSUPRA 90-80 MCG/ACT AERO SMARTSIG:2 inhalation Via Inhaler Every 4 Hours PRN   albuterol  (VENTOLIN  HFA) 108 (90 Base) MCG/ACT inhaler Inhale 2 puffs into the lungs every 6 (six) hours as needed for wheezing.   amLODipine  (NORVASC ) 10 MG tablet Take 1 tablet (10 mg total) by mouth daily. Schedule appointment for check up   atorvastatin  (LIPITOR) 40 MG tablet Take 1 tablet (40 mg total) by mouth daily as needed. MAKE PCP APPOINTMENT   bictegravir-emtricitabine -tenofovir  AF (BIKTARVY ) 50-200-25 MG TABS tablet TAKE 1 TABLET BY MOUTH DAILY.   bisoprolol -hydrochlorothiazide  (ZIAC ) 5-6.25 MG tablet Take 1 tablet by mouth daily.   bisoprolol -hydrochlorothiazide  (ZIAC ) 5-6.25 MG tablet Take 1 tablet by mouth daily.   cholecalciferol (VITAMIN D3) 10 MCG (400 UNIT) TABS tablet Take 400 Units  by mouth in the morning and at bedtime.   DULERA  200-5 MCG/ACT AERO INHALE 2 PUFFS INTO THE LUNGS TWICE DAILY   DULoxetine  (CYMBALTA ) 30 MG capsule Take 2 capsules (60 mg total) by mouth daily.   LINZESS  145 MCG CAPS capsule Take 145 mcg by mouth daily as needed (constipation).    LINZESS  290 MCG CAPS capsule Take 290 mcg by mouth daily.   metFORMIN   (GLUCOPHAGE -XR) 500 MG 24 hr tablet Take 1 tablet (500 mg total) by mouth 2 (two) times daily with a meal.   methocarbamol  (ROBAXIN ) 750 MG tablet Take 750 mg by mouth 3 (three) times daily as needed for muscle spasms.    NARCAN  4 MG/0.1ML LIQD nasal spray kit Place 1 spray into the nose as needed (accidental overdose).    omeprazole  (PRILOSEC) 20 MG capsule Take 2 capsules (40 mg total) by mouth 2 (two) times daily before a meal.   oxyCODONE -acetaminophen  (PERCOCET) 10-325 MG tablet Take 1 tablet by mouth every 4 (four) hours as needed for pain.   pravastatin  (PRAVACHOL ) 20 MG tablet Take 1 tablet (20 mg total) by mouth daily.   TRELEGY ELLIPTA  100-62.5-25 MCG/ACT AEPB Take 1 puff by mouth daily.   Facility-Administered Encounter Medications as of 05/27/2023  Medication   fluticasone  furoate-vilanterol (BREO ELLIPTA ) 100-25 MCG/INH 1 puff    Allergies (verified) Dapsone and Penicillins   History: Past Medical History:  Diagnosis Date   Acute renal insufficiency 06/06/2014   Arthritis    Asthma    Constipation 07/19/2014   COPD (chronic obstructive pulmonary disease) with acute bronchitis (HCC) 06/06/2014   Depression    Depression, major, recurrent, moderate (HCC) 04/02/2015   Dyspnea    HIV infection (HCC)    Hyperlipidemia    Hyperlipidemia    Hypertension    Hypertensive nephropathy 07/19/2014   On home oxygen  therapy    2L   Polycythemia 12/18/2020   Sleep apnea    Smoker 12/18/2020   Unintentional weight loss 02/13/2015   Past Surgical History:  Procedure Laterality Date   BIOPSY  09/21/2019   Procedure: BIOPSY;  Surgeon: Normie Becton., MD;  Location: [facility];  Service: Gastroenterology;;   COLONOSCOPY WITH PROPOFOL  N/A 09/21/2019   Procedure: COLONOSCOPY WITH PROPOFOL ;  Surgeon: Normie Becton., MD;  Location: [facility];  Service: Gastroenterology;  Laterality: N/A;   ESOPHAGOGASTRODUODENOSCOPY (EGD) WITH PROPOFOL  N/A 09/21/2019   Procedure:  ESOPHAGOGASTRODUODENOSCOPY (EGD) WITH PROPOFOL ;  Surgeon: Brice Campi Albino Alu., MD;  Location: [facility];  Service: Gastroenterology;  Laterality: N/A;   Family History  Problem Relation Age of Onset   Diabetes Mother    Healthy Father    Diabetes Sister    Diabetes Sister    Pancreatic cancer Paternal Grandmother 31   Colon cancer Neg Hx    Stomach cancer Neg Hx    Esophageal cancer Neg Hx    Inflammatory bowel disease Neg Hx    Liver disease Neg Hx    Rectal cancer Neg Hx    Social History   Socioeconomic History   Marital status: Married    Spouse name: Not on file   Number of children: Not on file   Years of education: Not on file   Highest education level: GED or equivalent  Occupational History   Not on file  Tobacco Use   Smoking status: Former    Current packs/day: 0.00    Average packs/day: 0.5 packs/day for 38.0 years (19.0 ttl pk-yrs)    Types: Cigarettes  Start date: 01/13/1980    Quit date: 01/03/2018    Years since quitting: 5.3   Smokeless tobacco: Never   Tobacco comments:    down to 5 cigs a day  Vaping Use   Vaping status: Never Used  Substance and Sexual Activity   Alcohol use: No    Alcohol/week: 0.0 standard drinks of alcohol   Drug use: No   Sexual activity: Not Currently    Partners: Male    Birth control/protection: None    Comment: given condoms  Other Topics Concern   Not on file  Social History Narrative   Not on file   Social Drivers of Health   Financial Resource Strain: Low Risk  (05/27/2023)   Overall Financial Resource Strain (CARDIA)    Difficulty of Paying Living Expenses: Not hard at all  Food Insecurity: No Food Insecurity (05/27/2023)   Hunger Vital Sign    Worried About Running Out of Food in the Last Year: Never true    Ran Out of Food in the Last Year: Never true  Transportation Needs: No Transportation Needs (05/27/2023)   PRAPARE - Administrator, Civil Service (Medical): No    Lack of  Transportation (Non-Medical): No  Physical Activity: Sufficiently Active (05/27/2023)   Exercise Vital Sign    Days of Exercise per Week: 5 days    Minutes of Exercise per Session: 30 min  Stress: No Stress Concern Present (05/27/2023)   [employer] of Occupational Health - Occupational Stress Questionnaire    Feeling of Stress : Not at all  Social Connections: Socially Integrated (05/27/2023)   Social Connection and Isolation Panel [NHANES]    Frequency of Communication with Friends and Family: More than three times a week    Frequency of Social Gatherings with Friends and Family: More than three times a week    Attends Religious Services: More than 4 times per year    Active Member of [employer] or Organizations: Yes    Attends Engineer, structural: More than 4 times per year    Marital Status: Married    Tobacco Counseling Counseling given: Not Answered Tobacco comments: down to 5 cigs a day    Clinical Intake:  Pre-visit preparation completed: Yes  Pain : No/denies pain Pain Score: 0-No pain     BMI - recorded: 24.55 Nutritional Status: BMI of 19-24  Normal Nutritional Risks: None Diabetes: No  Lab Results  Component Value Date   HGBA1C 6.0 (A) 02/26/2023   HGBA1C 5.6 05/30/2019   HGBA1C 6.3 (H) 01/16/2019     How often do you need to have someone help you when you read instructions, pamphlets, or other written materials from your doctor or pharmacy?: 1 - Never  Interpreter Needed?: No  Information entered by :: Rushton Early N. Logann Whitebread, LPN.   Activities of Daily Living     05/27/2023    4:34 PM 05/26/2023    7:07 PM  In your present state of health, do you have any difficulty performing the following activities:  Hearing? 0 0  Vision? 0 0  Difficulty concentrating or making decisions? 0 0  Walking or climbing stairs? 1 1  Dressing or bathing? 0 0  Doing errands, shopping? 0 0  Preparing Food and eating ? N N  Using the Toilet? N N  In the past  six months, have you accidently leaked urine? Y Y  Do you have problems with loss of bowel control? N N  Managing your Medications?  N N  Managing your Finances? N N  Housekeeping or managing your Housekeeping? N N    Patient Care Team: Wilhemena Harbour, MD as PCP - General (Family Medicine) Ernie Heal, Jerelyn Money, MD as PCP - Infectious Diseases (Infectious Diseases) Jolinda Necessary, MD (Inactive) as Consulting Physician (Gastroenterology)  Indicate any recent Medical Services you may have received from other than [facility] providers in the past year (date may be approximate).     Assessment:    This is a routine wellness examination for [facility].  Hearing/Vision screen Hearing Screening - Comments:: Denies hearing difficulties.  Vision Screening - Comments:: Wears rx glasses - not up to date with routine eye exams.    Goals Addressed               This Visit's Progress     Patient Stated (pt-stated)        05/27/2023: My goal is to maintain my health and walk my dogs.       Depression Screen     05/27/2023    4:37 PM 04/13/2023    9:45 AM 02/26/2023    2:59 PM 09/02/2022   10:31 AM 02/02/2022    3:52 PM 12/18/2020    3:01 PM 11/15/2019    9:30 AM  PHQ 2/9 Scores  PHQ - 2 Score 0 0 0 0 0 0 0  PHQ- 9 Score 4 5 2         Fall Risk     05/27/2023    4:34 PM 05/26/2023    7:07 PM 04/13/2023    9:44 AM 09/02/2022   10:31 AM 02/02/2022    3:49 PM  Fall Risk   Falls in the past year? 0 0 0 0 1  Number falls in past yr: 0 1 0  1  Injury with Fall? 0 0 0  0  Risk for fall due to : No Fall Risks   No Fall Risks   Follow up Falls prevention discussed;Falls evaluation completed   Falls evaluation completed     MEDICARE RISK AT HOME:  Medicare Risk at Home Any stairs in or around the home?: Yes If so, are there any without handrails?: No Home free of loose throw rugs in walkways, pet beds, electrical cords, etc?: Yes Adequate lighting in your home to reduce risk of falls?: Yes Life  alert?: No Use of a cane, walker or w/c?: No Grab bars in the bathroom?: Yes Shower chair or bench in shower?: No Elevated toilet seat or a handicapped toilet?: No  TIMED UP AND GO:  Was the test performed?  No  Cognitive Function: 6CIT completed    05/27/2023    4:53 PM  MMSE - Mini Mental State Exam  Not completed: Unable to complete        05/27/2023    4:45 PM  6CIT Screen  What Year? 0 points  What month? 0 points  What time? 0 points  Count back from 20 0 points  Months in reverse 0 points  Repeat phrase 0 points  Total Score 0 points    Immunizations Immunization History  Administered Date(s) Administered   Hepatitis B 05/28/2005, 07/23/2005, 10/07/2006, 10/08/2006   Influenza Split 11/20/2010, 01/01/2012   Influenza Whole 10/16/2004, 10/07/2006, 11/09/2008   Influenza,inj,Quad PF,6+ Mos 09/14/2012, 12/06/2013, 10/30/2015, 01/18/2017, 10/07/2017, 09/29/2018, 11/15/2019, 12/18/2020   Influenza-Unspecified 05/27/2016   Meningococcal Mcv4o 12/25/2015, 06/21/2017   PFIZER(Purple Top)SARS-COV-2 Vaccination 07/05/2019, 07/31/2019   PNEUMOCOCCAL CONJUGATE-20 04/13/2023   Pfizer(Comirnaty)Fall Seasonal Vaccine 12 years  and older 02/02/2022, 02/26/2023   Pneumococcal Conjugate-13 06/21/2017   Pneumococcal Polysaccharide-23 05/03/2008, 08/23/2015   Td 11/12/2004   Tdap 10/30/2015   Zoster Recombinant(Shingrix ) 04/13/2023    Screening Tests Health Maintenance  Topic Date Due   Diabetic kidney evaluation - Urine ACR  07/22/2016   Zoster Vaccines- Shingrix  (2 of 2) 06/08/2023   INFLUENZA VACCINE  08/13/2023   COVID-19 Vaccine (5 - Pfizer risk 2024-25 season) 08/26/2023   Cervical Cancer Screening (HPV/Pap Cotest)  09/29/2023   Diabetic kidney evaluation - eGFR measurement  04/12/2024   Medicare Annual Wellness (AWV)  05/26/2024   MAMMOGRAM  03/01/2025   DTaP/Tdap/Td (3 - Td or Tdap) 10/29/2025   Colonoscopy  09/20/2029   Pneumococcal Vaccine 76-41 Years old   Completed   Hepatitis C Screening  Completed   HIV Screening  Completed   HPV VACCINES  Aged Out   Meningococcal B Vaccine  Aged Out    Health Maintenance  Health Maintenance Due  Topic Date Due   Diabetic kidney evaluation - Urine ACR  07/22/2016   Health Maintenance Items Addressed: Yes Patient aware of current care gaps.   Additional Screening:  Vision Screening: Recommended annual ophthalmology exams for early detection of glaucoma and other disorders of the eye.  Dental Screening: Recommended annual dental exams for proper oral hygiene  Community Resource Referral / Chronic Care Management: CRR required this visit?  No   CCM required this visit?  No   Plan:    I have personally reviewed and noted the following in the patient's chart:   Medical and social history Use of alcohol, tobacco or illicit drugs  Current medications and supplements including opioid prescriptions. Patient is not currently taking opioid prescriptions. Functional ability and status Nutritional status Physical activity Advanced directives List of other physicians Hospitalizations, surgeries, and ER visits in previous 12 months Vitals Screenings to include cognitive, depression, and falls Referrals and appointments  In addition, I have reviewed and discussed with patient certain preventive protocols, quality metrics, and best practice recommendations. A written personalized care plan for preventive services as well as general preventive health recommendations were provided to patient.   Margette Sheldon, LPN   [id number]/9562   After Visit Summary: (MyChart) Due to this being a telephonic visit, the after visit summary with patients personalized plan was offered to patient via MyChart   Notes: Patient aware of current care gaps.  Immunization record was verified by [employer].

## 2023-05-27 NOTE — Patient Instructions (Signed)
 Molly Dillon , Thank you for taking time out of your busy schedule to complete your Annual Wellness Visit with me. I enjoyed our conversation and look forward to speaking with you again next year. I, as well as your care team,  appreciate your ongoing commitment to your health goals. Please review the following plan we discussed and let me know if I can assist you in the future. Your Game plan/ To Do List    Referrals: If you haven't heard from the office you've been referred to, please reach out to them at the phone provided.   Follow up Visits: Next Medicare AWV with our clinical staff: 06/08/2024 at 4:50 pm PHONE VISIT   Have you seen your provider in the last 6 months (3 months if uncontrolled diabetes)? Yes, last seen 03/22/2023 Next Office Visit with your provider: Please schedule your 3 month follow up with Dr. Tenna Fees in June 2025.  Clinician Recommendations:  Aim for 30 minutes of exercise or brisk walking, 6-8 glasses of water, and 5 servings of fruits and vegetables each day.       This is a list of the screening recommended for you and due dates:  Health Maintenance  Topic Date Due   Yearly kidney health urinalysis for diabetes  07/22/2016   Zoster (Shingles) Vaccine (2 of 2) 06/08/2023   Flu Shot  08/13/2023   COVID-19 Vaccine (5 - Pfizer risk 2024-25 season) 08/26/2023   Pap with HPV screening  09/29/2023   Yearly kidney function blood test for diabetes  04/12/2024   Medicare Annual Wellness Visit  05/26/2024   Mammogram  03/01/2025   DTaP/Tdap/Td vaccine (3 - Td or Tdap) 10/29/2025   Colon Cancer Screening  09/20/2029   Pneumococcal Vaccination  Completed   Hepatitis C Screening  Completed   HIV Screening  Completed   HPV Vaccine  Aged Out   Meningitis B Vaccine  Aged Out    Advanced directives: (Declined) Advance directive discussed with you today. Even though you declined this today, please call our office should you change your mind, and we can give you the proper  paperwork for you to fill out. Advance Care Planning is important because it:  [x]  Makes sure you receive the medical care that is consistent with your values, goals, and preferences  [x]  It provides guidance to your family and loved ones and reduces their decisional burden about whether or not they are making the right decisions based on your wishes.  Follow the link provided in your after visit summary or read over the paperwork we have mailed to you to help you started getting your Advance Directives in place. If you need assistance in completing these, please reach out to us  so that we can help you!  See attachments for Preventive Care and Fall Prevention Tips.

## 2023-06-09 ENCOUNTER — Other Ambulatory Visit: Payer: Self-pay

## 2023-06-09 NOTE — Progress Notes (Signed)
 Clinical Intervention Note  Clinical Intervention Notes: Patient reported starting a lung detox, but unsure of the name or any specific medication/supplement. It appears that most "lung detox" treatments contain Mullein Leaf. Checked for drug interactions using Natural Medicines drug interaction checker and no DDIs found with Biktarvy .   Clinical Intervention Outcomes: Prevention of an adverse drug event   Advertising account planner

## 2023-06-09 NOTE — Progress Notes (Signed)
 Specialty Pharmacy Refill Coordination Note  Molly Dillon is a 63 y.o. female contacted today regarding refills of specialty medication(s) Bictegravir-Emtricitab-Tenofov (Biktarvy )   Patient requested (Patient-Rptd) Delivery   Delivery date: (Patient-Rptd) 06/14/23   Verified address: (Patient-Rptd) [address]   Medication will be filled on 06/11/23.

## 2023-06-19 ENCOUNTER — Other Ambulatory Visit: Payer: Self-pay | Admitting: Student

## 2023-06-19 ENCOUNTER — Other Ambulatory Visit (HOSPITAL_COMMUNITY): Payer: Self-pay

## 2023-06-19 DIAGNOSIS — I10 Essential (primary) hypertension: Secondary | ICD-10-CM

## 2023-06-19 DIAGNOSIS — M545 Low back pain, unspecified: Secondary | ICD-10-CM

## 2023-06-21 ENCOUNTER — Other Ambulatory Visit (HOSPITAL_COMMUNITY): Payer: Self-pay

## 2023-06-21 ENCOUNTER — Other Ambulatory Visit: Payer: Self-pay

## 2023-06-21 MED ORDER — AMLODIPINE BESYLATE 10 MG PO TABS
10.0000 mg | ORAL_TABLET | Freq: Every day | ORAL | 2 refills | Status: DC
Start: 2023-06-21 — End: 2023-09-15
  Filled 2023-06-21: qty 30, 30d supply, fill #0
  Filled 2023-07-15: qty 30, 30d supply, fill #1
  Filled 2023-08-19: qty 30, 30d supply, fill #2

## 2023-06-21 MED ORDER — DULOXETINE HCL 30 MG PO CPEP
60.0000 mg | ORAL_CAPSULE | Freq: Every day | ORAL | 2 refills | Status: DC
  Filled 2023-06-21: qty 60, 30d supply, fill #0
  Filled 2023-07-15: qty 60, 30d supply, fill #1
  Filled 2023-08-19: qty 60, 30d supply, fill #2

## 2023-06-21 MED ORDER — BISOPROLOL-HYDROCHLOROTHIAZIDE 5-6.25 MG PO TABS
1.0000 | ORAL_TABLET | Freq: Every day | ORAL | 2 refills | Status: DC
  Filled 2023-06-21: qty 30, 30d supply, fill #0
  Filled 2023-08-02: qty 30, 30d supply, fill #1
  Filled 2023-09-15: qty 30, 30d supply, fill #2

## 2023-06-22 ENCOUNTER — Other Ambulatory Visit (HOSPITAL_COMMUNITY): Payer: Self-pay

## 2023-06-22 ENCOUNTER — Encounter: Payer: Self-pay | Admitting: *Deleted

## 2023-07-15 ENCOUNTER — Other Ambulatory Visit: Payer: Self-pay

## 2023-07-15 ENCOUNTER — Encounter (INDEPENDENT_AMBULATORY_CARE_PROVIDER_SITE_OTHER): Payer: Self-pay

## 2023-07-15 NOTE — Progress Notes (Signed)
 Specialty Pharmacy Refill Coordination Note  Molly Dillon is a 63 y.o. female contacted today regarding refills of specialty medication(s) Bictegravir-Emtricitab-Tenofov (Biktarvy )   Patient requested Delivery   Delivery date: 07/19/23   Verified address: [address], [place] [address]   Medication will be filled on 07/15/23.

## 2023-08-03 ENCOUNTER — Other Ambulatory Visit: Payer: Self-pay

## 2023-08-03 ENCOUNTER — Other Ambulatory Visit (HOSPITAL_COMMUNITY): Payer: Self-pay

## 2023-08-04 ENCOUNTER — Other Ambulatory Visit (HOSPITAL_COMMUNITY): Payer: Self-pay

## 2023-08-09 ENCOUNTER — Other Ambulatory Visit: Payer: Self-pay

## 2023-08-09 NOTE — Progress Notes (Signed)
 Specialty Pharmacy Refill Coordination Note  Molly Dillon is a 64 y.o. female contacted today regarding refills of specialty medication(s) Bictegravir-Emtricitab-Tenofov (Biktarvy )   Patient requested Delivery   Delivery date: 08/18/23   Verified address: [address] [place] [address]   Medication will be filled on 08.05.25.

## 2023-08-11 ENCOUNTER — Other Ambulatory Visit: Payer: Self-pay

## 2023-08-19 ENCOUNTER — Other Ambulatory Visit (HOSPITAL_COMMUNITY): Payer: Self-pay

## 2023-08-19 ENCOUNTER — Other Ambulatory Visit: Payer: Self-pay

## 2023-09-13 ENCOUNTER — Encounter (INDEPENDENT_AMBULATORY_CARE_PROVIDER_SITE_OTHER): Payer: Self-pay

## 2023-09-14 ENCOUNTER — Other Ambulatory Visit: Payer: Self-pay

## 2023-09-14 NOTE — Progress Notes (Signed)
 Specialty Pharmacy Refill Coordination Note  Molly Dillon is a 63 y.o. female contacted today regarding refills of specialty medication(s) Bictegravir-Emtricitab-Tenofov (Biktarvy )   Patient requested (Patient-Rptd) Delivery   Delivery date: 09/16/23   Verified address: (Patient-Rptd) [address] [place] [address]   Medication will be filled on 0903/25.

## 2023-09-15 ENCOUNTER — Other Ambulatory Visit: Payer: Self-pay | Admitting: Student

## 2023-09-15 ENCOUNTER — Telehealth: Payer: Self-pay | Admitting: Infectious Disease

## 2023-09-15 ENCOUNTER — Other Ambulatory Visit (HOSPITAL_COMMUNITY): Payer: Self-pay

## 2023-09-15 ENCOUNTER — Other Ambulatory Visit: Payer: Self-pay

## 2023-09-15 DIAGNOSIS — M545 Low back pain, unspecified: Secondary | ICD-10-CM

## 2023-09-15 DIAGNOSIS — I10 Essential (primary) hypertension: Secondary | ICD-10-CM

## 2023-09-15 DIAGNOSIS — E785 Hyperlipidemia, unspecified: Secondary | ICD-10-CM

## 2023-09-15 DIAGNOSIS — B20 Human immunodeficiency virus [HIV] disease: Secondary | ICD-10-CM

## 2023-09-15 MED ORDER — CYCLOSPORINE 0.05 % OP EMUL
1.0000 [drp] | Freq: Two times a day (BID) | OPHTHALMIC | 4 refills | Status: AC
  Filled 2023-09-15: qty 60, 30d supply, fill #0
  Filled 2023-12-10: qty 60, 30d supply, fill #1

## 2023-09-15 MED ORDER — ATORVASTATIN CALCIUM 40 MG PO TABS
40.0000 mg | ORAL_TABLET | Freq: Every day | ORAL | 4 refills | Status: DC | PRN
  Filled 2023-09-15: qty 30, 30d supply, fill #0
  Filled 2023-10-10: qty 30, 30d supply, fill #1
  Filled 2023-11-10: qty 30, 30d supply, fill #2
  Filled 2023-12-10: qty 30, 30d supply, fill #3

## 2023-09-15 NOTE — Telephone Encounter (Signed)
 Appears Madeleyn has been getting both atorvastatin  and pravastatin  filled.  Atorvastatin  has been dispensed regularly since 10/2020, pravastatin  has also been dispensed since 10/2020.  Spoke with patient, discussed with her that she should only be taking one statin.   Spoke with Tamra, RPH at [facility] and notified her of duplicate statins. She will cancel Rx request for pravastatin .   Called Dr. Teresa office with [facility] to request that they discontinue pravastatin . Had to leave a message with [facility] with the call center asking her to let him know that patient was being prescribed two statins and that we have asked her to stop the pravastatin  and continue atorvastatin . Left call back number in case their office has questions.   Rettie Laird, BSN, RN

## 2023-09-15 NOTE — Telephone Encounter (Signed)
 Ok to adjust instructions and take out the as needed part?  Thanks!

## 2023-09-16 ENCOUNTER — Other Ambulatory Visit: Payer: Self-pay

## 2023-09-16 ENCOUNTER — Other Ambulatory Visit (HOSPITAL_COMMUNITY): Payer: Self-pay

## 2023-09-17 ENCOUNTER — Other Ambulatory Visit: Payer: Self-pay

## 2023-09-17 ENCOUNTER — Other Ambulatory Visit (HOSPITAL_COMMUNITY): Payer: Self-pay

## 2023-09-17 MED ORDER — AMLODIPINE BESYLATE 10 MG PO TABS
10.0000 mg | ORAL_TABLET | Freq: Every day | ORAL | 1 refills | Status: DC
Start: 2023-09-17 — End: 2023-11-10
  Filled 2023-09-17: qty 30, 30d supply, fill #0
  Filled 2023-10-10: qty 30, 30d supply, fill #1

## 2023-09-17 MED ORDER — DULOXETINE HCL 30 MG PO CPEP
60.0000 mg | ORAL_CAPSULE | Freq: Every day | ORAL | 2 refills | Status: DC
  Filled 2023-09-17: qty 60, 30d supply, fill #0
  Filled 2023-10-10: qty 60, 30d supply, fill #1
  Filled 2023-11-10: qty 60, 30d supply, fill #2

## 2023-09-20 ENCOUNTER — Other Ambulatory Visit (HOSPITAL_COMMUNITY): Payer: Self-pay

## 2023-09-20 ENCOUNTER — Other Ambulatory Visit: Payer: Self-pay

## 2023-09-20 MED ORDER — PRAVASTATIN SODIUM 20 MG PO TABS
20.0000 mg | ORAL_TABLET | Freq: Every day | ORAL | 3 refills | Status: DC
  Filled 2023-09-20 (×2): qty 90, 90d supply, fill #0
  Filled 2023-10-10 – 2023-12-10 (×3): qty 90, 90d supply, fill #1

## 2023-10-07 ENCOUNTER — Other Ambulatory Visit: Payer: Self-pay

## 2023-10-08 ENCOUNTER — Other Ambulatory Visit: Payer: Self-pay

## 2023-10-10 ENCOUNTER — Encounter (INDEPENDENT_AMBULATORY_CARE_PROVIDER_SITE_OTHER): Payer: Self-pay

## 2023-10-11 ENCOUNTER — Other Ambulatory Visit (HOSPITAL_COMMUNITY): Payer: Self-pay

## 2023-10-11 ENCOUNTER — Other Ambulatory Visit: Payer: Self-pay

## 2023-10-11 ENCOUNTER — Other Ambulatory Visit: Payer: Self-pay | Admitting: Pharmacy Technician

## 2023-10-11 NOTE — Progress Notes (Signed)
 Specialty Pharmacy Refill Coordination Note  DARIN REDMANN is a 63 y.o. female contacted today regarding refills of specialty medication(s) Bictegravir-Emtricitab-Tenofov (Biktarvy )   Patient requested (Patient-Rptd) Delivery   Delivery date:10/15/23 Verified address: (Patient-Rptd) [address] [place] [address]   Medication will be filled on 10/14/23.

## 2023-10-12 NOTE — Progress Notes (Deleted)
 Subjective:  Chief complaint: follow-up for HIV disease on medications   Patient ID: Molly Dillon, female    DOB: 12-Apr-1960, 63 y.o.   MRN: [id number]  HPI  Past Medical History:  Diagnosis Date   Acute renal insufficiency 06/06/2014   Arthritis    Asthma    Constipation 07/19/2014   COPD (chronic obstructive pulmonary disease) with acute bronchitis (HCC) 06/06/2014   Depression    Depression, major, recurrent, moderate (HCC) 04/02/2015   Dyspnea    HIV infection (HCC)    Hyperlipidemia    Hyperlipidemia    Hypertension    Hypertensive nephropathy 07/19/2014   On home oxygen  therapy    2L   Polycythemia 12/18/2020   Sleep apnea    Smoker 12/18/2020   Unintentional weight loss 02/13/2015    Past Surgical History:  Procedure Laterality Date   BIOPSY  09/21/2019   Procedure: BIOPSY;  Surgeon: Wilhelmenia Aloha Raddle., MD;  Location: [facility];  Service: Gastroenterology;;   COLONOSCOPY WITH PROPOFOL  N/A 09/21/2019   Procedure: COLONOSCOPY WITH PROPOFOL ;  Surgeon: Wilhelmenia Aloha Raddle., MD;  Location: [facility];  Service: Gastroenterology;  Laterality: N/A;   ESOPHAGOGASTRODUODENOSCOPY (EGD) WITH PROPOFOL  N/A 09/21/2019   Procedure: ESOPHAGOGASTRODUODENOSCOPY (EGD) WITH PROPOFOL ;  Surgeon: Wilhelmenia Aloha Raddle., MD;  Location: [facility];  Service: Gastroenterology;  Laterality: N/A;    Family History  Problem Relation Age of Onset   Diabetes Mother    Healthy Father    Diabetes Sister    Diabetes Sister    Pancreatic cancer Paternal Grandmother 58   Colon cancer Neg Hx    Stomach cancer Neg Hx    Esophageal cancer Neg Hx    Inflammatory bowel disease Neg Hx    Liver disease Neg Hx    Rectal cancer Neg Hx       Social History   Socioeconomic History   Marital status: Married    Spouse name: Not on file   Number of children: Not on file   Years of education: Not on file   Highest education level: GED or equivalent  Occupational History   Not on  file  Tobacco Use   Smoking status: Former    Current packs/day: 0.00    Average packs/day: 0.5 packs/day for 38.0 years (19.0 ttl pk-yrs)    Types: Cigarettes    Start date: 01/13/1980    Quit date: 01/03/2018    Years since quitting: 5.7   Smokeless tobacco: Never   Tobacco comments:    down to 5 cigs a day  Vaping Use   Vaping status: Never Used  Substance and Sexual Activity   Alcohol use: No    Alcohol/week: 0.0 standard drinks of alcohol   Drug use: No   Sexual activity: Not Currently    Partners: Male    Birth control/protection: None    Comment: given condoms  Other Topics Concern   Not on file  Social History Narrative   Not on file   Social Drivers of Health   Financial Resource Strain: Low Risk  (05/27/2023)   Overall Financial Resource Strain (CARDIA)    Difficulty of Paying Living Expenses: Not hard at all  Food Insecurity: No Food Insecurity (05/27/2023)   Hunger Vital Sign    Worried About Running Out of Food in the Last Year: Never true    Ran Out of Food in the Last Year: Never true  Transportation Needs: No Transportation Needs (05/27/2023)   PRAPARE - Transportation    Lack  of Transportation (Medical): No    Lack of Transportation (Non-Medical): No  Physical Activity: Sufficiently Active (05/27/2023)   Exercise Vital Sign    Days of Exercise per Week: 5 days    Minutes of Exercise per Session: 30 min  Stress: No Stress Concern Present (05/27/2023)   [employer] of Occupational Health - Occupational Stress Questionnaire    Feeling of Stress : Not at all  Social Connections: Socially Integrated (05/27/2023)   Social Connection and Isolation Panel    Frequency of Communication with Friends and Family: More than three times a week    Frequency of Social Gatherings with Friends and Family: More than three times a week    Attends Religious Services: More than 4 times per year    Active Member of [employer] or Organizations: Yes    Attends Museum/gallery exhibitions officer: More than 4 times per year    Marital Status: Married    Allergies  Allergen Reactions   Dapsone Shortness Of Breath   Penicillins Anaphylaxis    Has patient had a PCN reaction causing immediate rash, facial/tongue/throat swelling, SOB or lightheadedness with hypotension: Yes Has patient had a PCN reaction causing severe rash involving mucus membranes or skin necrosis: Yes Has patient had a PCN reaction that required hospitalization: No Has patient had a PCN reaction occurring within the last 10 years: No If all of the above answers are NO, then may proceed with Cephalosporin use.      Current Outpatient Medications:    AIRSUPRA 90-80 MCG/ACT AERO, SMARTSIG:2 inhalation Via Inhaler Every 4 Hours PRN, Disp: , Rfl:    albuterol  (VENTOLIN  HFA) 108 (90 Base) MCG/ACT inhaler, Inhale 2 puffs into the lungs every 6 (six) hours as needed for wheezing., Disp: 18 g, Rfl: 11   amLODipine  (NORVASC ) 10 MG tablet, Take 1 tablet (10 mg total) by mouth daily. Schedule appointment for check up, Disp: 30 tablet, Rfl: 1   atorvastatin  (LIPITOR) 40 MG tablet, Take 1 tablet (40 mg total) by mouth daily as needed., Disp: 30 tablet, Rfl: 4   bictegravir-emtricitabine -tenofovir  AF (BIKTARVY ) 50-200-25 MG TABS tablet, TAKE 1 TABLET BY MOUTH DAILY., Disp: 30 tablet, Rfl: 11   bisoprolol -hydrochlorothiazide  (ZIAC ) 5-6.25 MG tablet, Take 1 tablet by mouth daily., Disp: 30 tablet, Rfl: 2   bisoprolol -hydrochlorothiazide  (ZIAC ) 5-6.25 MG tablet, Take 1 tablet by mouth daily., Disp: 30 tablet, Rfl: 2   cholecalciferol (VITAMIN D3) 10 MCG (400 UNIT) TABS tablet, Take 400 Units by mouth in the morning and at bedtime., Disp: , Rfl:    cycloSPORINE  (RESTASIS ) 0.05 % ophthalmic emulsion, Instill 1 drop into both eyes twice a day, Disp: 180 each, Rfl: 4   DULERA  200-5 MCG/ACT AERO, INHALE 2 PUFFS INTO THE LUNGS TWICE DAILY, Disp: 13 g, Rfl: 5   DULoxetine  (CYMBALTA ) 30 MG capsule, Take 2 capsules (60  mg total) by mouth daily., Disp: 60 capsule, Rfl: 2   LINZESS  145 MCG CAPS capsule, Take 145 mcg by mouth daily as needed (constipation). , Disp: , Rfl:    LINZESS  290 MCG CAPS capsule, Take 290 mcg by mouth daily., Disp: , Rfl:    metFORMIN  (GLUCOPHAGE -XR) 500 MG 24 hr tablet, Take 1 tablet (500 mg total) by mouth 2 (two) times daily with a meal., Disp: 60 tablet, Rfl: 1   methocarbamol  (ROBAXIN ) 750 MG tablet, Take 750 mg by mouth 3 (three) times daily as needed for muscle spasms. , Disp: , Rfl:    NARCAN  4 MG/0.1ML LIQD nasal  spray kit, Place 1 spray into the nose as needed (accidental overdose). , Disp: , Rfl:    omeprazole  (PRILOSEC) 20 MG capsule, Take 2 capsules (40 mg total) by mouth 2 (two) times daily before a meal., Disp: 60 capsule, Rfl: 4   oxyCODONE -acetaminophen  (PERCOCET) 10-325 MG tablet, Take 1 tablet by mouth every 4 (four) hours as needed for pain., Disp: 30 tablet, Rfl: 0   pravastatin  (PRAVACHOL ) 20 MG tablet, Take 1 tablet (20 mg total) by mouth daily., Disp: 90 tablet, Rfl: 3   TRELEGY ELLIPTA  100-62.5-25 MCG/ACT AEPB, Inhale 1 puff by mouth daily., Disp: 60 each, Rfl: 2  Current Facility-Administered Medications:    fluticasone  furoate-vilanterol (BREO ELLIPTA ) 100-25 MCG/INH 1 puff, 1 puff, Inhalation, Daily, Autry-Lott, Simone, DO   Review of Systems     Objective:   Physical Exam        Assessment & Plan:

## 2023-10-13 ENCOUNTER — Other Ambulatory Visit: Payer: Self-pay

## 2023-10-13 ENCOUNTER — Other Ambulatory Visit (HOSPITAL_COMMUNITY): Payer: Self-pay

## 2023-10-13 ENCOUNTER — Ambulatory Visit: Admitting: Infectious Disease

## 2023-10-13 DIAGNOSIS — N1832 Chronic kidney disease, stage 3b: Secondary | ICD-10-CM

## 2023-10-13 DIAGNOSIS — F17200 Nicotine dependence, unspecified, uncomplicated: Secondary | ICD-10-CM

## 2023-10-13 DIAGNOSIS — E785 Hyperlipidemia, unspecified: Secondary | ICD-10-CM

## 2023-10-13 DIAGNOSIS — I10 Essential (primary) hypertension: Secondary | ICD-10-CM

## 2023-10-13 DIAGNOSIS — B20 Human immunodeficiency virus [HIV] disease: Secondary | ICD-10-CM

## 2023-10-13 DIAGNOSIS — E782 Mixed hyperlipidemia: Secondary | ICD-10-CM

## 2023-11-02 ENCOUNTER — Other Ambulatory Visit: Payer: Self-pay

## 2023-11-04 ENCOUNTER — Other Ambulatory Visit: Payer: Self-pay

## 2023-11-09 ENCOUNTER — Other Ambulatory Visit (HOSPITAL_COMMUNITY): Payer: Self-pay

## 2023-11-10 ENCOUNTER — Other Ambulatory Visit: Payer: Self-pay | Admitting: Family Medicine

## 2023-11-10 DIAGNOSIS — I10 Essential (primary) hypertension: Secondary | ICD-10-CM

## 2023-11-11 ENCOUNTER — Other Ambulatory Visit: Payer: Self-pay

## 2023-11-11 MED ORDER — AMLODIPINE BESYLATE 10 MG PO TABS
10.0000 mg | ORAL_TABLET | Freq: Every day | ORAL | 1 refills | Status: DC
  Filled 2023-11-11: qty 30, 30d supply, fill #0
  Filled 2023-12-10: qty 30, 30d supply, fill #1

## 2023-11-11 NOTE — Progress Notes (Signed)
 Specialty Pharmacy Refill Coordination Note  Molly Dillon is a 63 y.o. female contacted today regarding refills of specialty medication(s) Bictegravir-Emtricitab-Tenofov (Biktarvy )   Patient requested Delivery   Delivery date: 11/19/23   Verified address: [address] [place] [address]   Medication will be filled on: 11/18/23   Sent patient mychart message with anticipated delivery date

## 2023-11-12 ENCOUNTER — Other Ambulatory Visit (HOSPITAL_COMMUNITY): Payer: Self-pay

## 2023-11-12 ENCOUNTER — Other Ambulatory Visit: Payer: Self-pay

## 2023-11-18 ENCOUNTER — Other Ambulatory Visit: Payer: Self-pay

## 2023-12-10 ENCOUNTER — Other Ambulatory Visit (HOSPITAL_COMMUNITY): Payer: Self-pay

## 2023-12-10 ENCOUNTER — Other Ambulatory Visit: Payer: Self-pay | Admitting: Family Medicine

## 2023-12-10 DIAGNOSIS — M545 Low back pain, unspecified: Secondary | ICD-10-CM

## 2023-12-13 ENCOUNTER — Other Ambulatory Visit (HOSPITAL_COMMUNITY): Payer: Self-pay

## 2023-12-13 ENCOUNTER — Other Ambulatory Visit: Payer: Self-pay | Admitting: Pharmacy Technician

## 2023-12-13 ENCOUNTER — Other Ambulatory Visit: Payer: Self-pay

## 2023-12-13 MED ORDER — DULOXETINE HCL 30 MG PO CPEP
60.0000 mg | ORAL_CAPSULE | Freq: Every day | ORAL | 2 refills | Status: AC
  Filled 2023-12-13: qty 60, 30d supply, fill #0
  Filled 2024-01-07: qty 60, 30d supply, fill #1
  Filled 2024-01-26 – 2024-02-11 (×2): qty 60, 30d supply, fill #2

## 2023-12-13 MED ORDER — BISOPROLOL-HYDROCHLOROTHIAZIDE 5-6.25 MG PO TABS
1.0000 | ORAL_TABLET | Freq: Every day | ORAL | 2 refills | Status: AC
  Filled 2023-12-13: qty 30, 30d supply, fill #0
  Filled 2024-02-11: qty 30, 30d supply, fill #1

## 2023-12-13 NOTE — Progress Notes (Signed)
 Specialty Pharmacy Refill Coordination Note  Molly Dillon is a 63 y.o. female contacted today regarding refills of specialty medication(s) Biktarvy   Patient requested Delivery  Delivery date: 12/17/2023 Verified address: [address] [place], [place]   Medication will be filled on: 12/16/2023

## 2023-12-16 ENCOUNTER — Other Ambulatory Visit: Payer: Self-pay

## 2023-12-16 NOTE — Progress Notes (Signed)
 The 10-year ASCVD risk score (Arnett DK, et al., 2019) is: 6.4%   Values used to calculate the score:     Age: 63 years     Clincally relevant sex: Female     Is Non-Hispanic African American: Yes     Diabetic: No     Tobacco smoker: No     Systolic Blood Pressure: 127 mmHg     Is BP treated: Yes     HDL Cholesterol: 71 mg/dL     Total Cholesterol: 188 mg/dL  Currently prescribed atorvastatin  40 mg.  Garo Heidelberg, BSN, RN

## 2024-01-03 ENCOUNTER — Other Ambulatory Visit (HOSPITAL_COMMUNITY): Payer: Self-pay

## 2024-01-03 ENCOUNTER — Other Ambulatory Visit: Payer: Self-pay

## 2024-01-03 ENCOUNTER — Ambulatory Visit (INDEPENDENT_AMBULATORY_CARE_PROVIDER_SITE_OTHER): Admitting: Infectious Disease

## 2024-01-03 ENCOUNTER — Encounter: Payer: Self-pay | Admitting: Infectious Disease

## 2024-01-03 VITALS — BP 157/79 | HR 105 | Temp 97.7°F | Ht 64.0 in | Wt 140.0 lb

## 2024-01-03 DIAGNOSIS — J45909 Unspecified asthma, uncomplicated: Secondary | ICD-10-CM | POA: Diagnosis not present

## 2024-01-03 DIAGNOSIS — I10 Essential (primary) hypertension: Secondary | ICD-10-CM | POA: Diagnosis not present

## 2024-01-03 DIAGNOSIS — R634 Abnormal weight loss: Secondary | ICD-10-CM | POA: Diagnosis not present

## 2024-01-03 DIAGNOSIS — R7303 Prediabetes: Secondary | ICD-10-CM | POA: Diagnosis not present

## 2024-01-03 DIAGNOSIS — R195 Other fecal abnormalities: Secondary | ICD-10-CM

## 2024-01-03 DIAGNOSIS — B20 Human immunodeficiency virus [HIV] disease: Secondary | ICD-10-CM | POA: Diagnosis not present

## 2024-01-03 DIAGNOSIS — J4489 Other specified chronic obstructive pulmonary disease: Secondary | ICD-10-CM | POA: Diagnosis not present

## 2024-01-03 DIAGNOSIS — N1832 Chronic kidney disease, stage 3b: Secondary | ICD-10-CM

## 2024-01-03 DIAGNOSIS — E785 Hyperlipidemia, unspecified: Secondary | ICD-10-CM | POA: Diagnosis not present

## 2024-01-03 DIAGNOSIS — Z7185 Encounter for immunization safety counseling: Secondary | ICD-10-CM

## 2024-01-03 MED ORDER — BIKTARVY 50-200-25 MG PO TABS
1.0000 | ORAL_TABLET | Freq: Every day | ORAL | 11 refills | Status: AC
  Filled 2024-01-03 – 2024-02-11 (×5): qty 30, 30d supply, fill #0

## 2024-01-03 MED ORDER — ATORVASTATIN CALCIUM 40 MG PO TABS
40.0000 mg | ORAL_TABLET | Freq: Every day | ORAL | 11 refills | Status: AC | PRN
  Filled 2024-01-03: qty 30, 30d supply, fill #0
  Filled 2024-01-07 – 2024-02-11 (×3): qty 30, 30d supply, fill #1

## 2024-01-03 NOTE — Progress Notes (Signed)
 "  Subjective:  Chief complaint: follow-up for HIV disease on medications   Patient ID: Molly Dillon, female    DOB: [birth year], 63 y.o.   MRN: [id number]  HPI  Discussed the use of AI scribe software for clinical note transcription with the patient, who gave verbal consent to proceed.  History of Present Illness   Molly Dillon is a 63 year old female with HIV who presents for routine follow-up.  She is currently on Biktarvy  for HIV management. Her most recent labs showed an undetectable viral load and a CD4 count of 474. She has received her flu and COVID vaccinations this fall.  She has experienced ongoing weight loss since April, which remains unexplained and has not stabilized. She denies apoor appetite and diarrhea, noting that 'every time I eat, it comes right back through me.'  Her medical history includes asthma, managed with inhaled medications such as Airsupra, albuterol , Spiriva , and Dulera . She is also on atorvastatin  for cholesterol management and bisoprolol  hydrochlorothiazide  for blood pressure control. She uses cyclosporin eye drops and takes vitamin D supplements. She was previously prescribed metformin  for diabetes but did not tolerate it, expressing concern that 'it was going to kill me.'  She has allergies to penicillin and dapsone, with a history of a severe reaction to penicillin causing respiratory distress, which required hospitalization.  Socially, she enjoys cooking and spending time with her four grandchildren, three girls and a boy.      Past Medical History:  Diagnosis Date   Acute renal insufficiency 06/06/2014   Arthritis    Asthma    Constipation 07/19/2014   COPD (chronic obstructive pulmonary disease) with acute bronchitis (HCC) 06/06/2014   Depression    Depression, major, recurrent, moderate (HCC) 04/02/2015   Dyspnea    HIV infection (HCC)    Hyperlipidemia    Hyperlipidemia    Hypertension    Hypertensive nephropathy 07/19/2014   On home  oxygen  therapy    2L   Polycythemia 12/18/2020   Sleep apnea    Smoker 12/18/2020   Unintentional weight loss 02/13/2015    Past Surgical History:  Procedure Laterality Date   BIOPSY  09/21/2019   Procedure: BIOPSY;  Surgeon: Wilhelmenia Aloha Raddle., MD;  Location: [facility];  Service: Gastroenterology;;   COLONOSCOPY WITH PROPOFOL  N/A 09/21/2019   Procedure: COLONOSCOPY WITH PROPOFOL ;  Surgeon: Wilhelmenia Aloha Raddle., MD;  Location: [facility];  Service: Gastroenterology;  Laterality: N/A;   ESOPHAGOGASTRODUODENOSCOPY (EGD) WITH PROPOFOL  N/A 09/21/2019   Procedure: ESOPHAGOGASTRODUODENOSCOPY (EGD) WITH PROPOFOL ;  Surgeon: Wilhelmenia Aloha Raddle., MD;  Location: [facility];  Service: Gastroenterology;  Laterality: N/A;    Family History  Problem Relation Age of Onset   Diabetes Mother    Healthy Father    Diabetes Sister    Diabetes Sister    Pancreatic cancer Paternal Grandmother 72   Colon cancer Neg Hx    Stomach cancer Neg Hx    Esophageal cancer Neg Hx    Inflammatory bowel disease Neg Hx    Liver disease Neg Hx    Rectal cancer Neg Hx       Social History   Socioeconomic History   Marital status: Married    Spouse name: Not on file   Number of children: Not on file   Years of education: Not on file   Highest education level: GED or equivalent  Occupational History   Not on file  Tobacco Use   Smoking status: Former    Current packs/day:  0.00    Average packs/day: 0.5 packs/day for 38.0 years (19.0 ttl pk-yrs)    Types: Cigarettes    Start date: 01/13/1980    Quit date: 01/03/2018    Years since quitting: 6.0   Smokeless tobacco: Never   Tobacco comments:    down to 5 cigs a day  Vaping Use   Vaping status: Never Used  Substance and Sexual Activity   Alcohol use: No    Alcohol/week: 0.0 standard drinks of alcohol   Drug use: No   Sexual activity: Not Currently    Partners: Male    Birth control/protection: None    Comment: given condoms  Other  Topics Concern   Not on file  Social History Narrative   Not on file   Social Drivers of Health   Tobacco Use: Medium Risk (01/03/2024)   Patient History    Smoking Tobacco Use: Former    Smokeless Tobacco Use: Never    Passive Exposure: Not on file  Financial Resource Strain: Low Risk (05/27/2023)   Overall Financial Resource Strain (CARDIA)    Difficulty of Paying Living Expenses: Not hard at all  Food Insecurity: No Food Insecurity (05/27/2023)   Hunger Vital Sign    Worried About Running Out of Food in the Last Year: Never true    Ran Out of Food in the Last Year: Never true  Transportation Needs: No Transportation Needs (05/27/2023)   PRAPARE - Administrator, Civil Service (Medical): No    Lack of Transportation (Non-Medical): No  Physical Activity: Sufficiently Active (05/27/2023)   Exercise Vital Sign    Days of Exercise per Week: 5 days    Minutes of Exercise per Session: 30 min  Stress: No Stress Concern Present (05/27/2023)   [employer] of Occupational Health - Occupational Stress Questionnaire    Feeling of Stress : Not at all  Social Connections: Socially Integrated (05/27/2023)   Social Connection and Isolation Panel    Frequency of Communication with Friends and Family: More than three times a week    Frequency of Social Gatherings with Friends and Family: More than three times a week    Attends Religious Services: More than 4 times per year    Active Member of Clubs or Organizations: Yes    Attends Banker Meetings: More than 4 times per year    Marital Status: Married  Depression (PHQ2-9): Low Risk (05/27/2023)   Depression (PHQ2-9)    PHQ-2 Score: 4  Recent Concern: Depression (PHQ2-9) - Medium Risk (04/13/2023)   Depression (PHQ2-9)    PHQ-2 Score: 5  Alcohol Screen: Low Risk (05/27/2023)   Alcohol Screen    Last Alcohol Screening Score (AUDIT): 0  Housing: Low Risk (05/27/2023)   Housing Stability Vital Sign    Unable to Pay for  Housing in the Last Year: No    Number of Times Moved in the Last Year: 0    Homeless in the Last Year: No  Utilities: Not At Risk (05/27/2023)   [facility] Utilities    Threatened with loss of utilities: No  Health Literacy: Adequate Health Literacy (05/27/2023)   B1300 Health Literacy    Frequency of need for help with medical instructions: Rarely    Allergies[1]  Current Medications[2]   Review of Systems  Constitutional:  Positive for unexpected weight change. Negative for activity change, appetite change, chills, diaphoresis, fatigue and fever.  HENT:  Negative for congestion, rhinorrhea, sinus pressure, sneezing, sore throat and trouble swallowing.  Eyes:  Negative for photophobia and visual disturbance.  Respiratory:  Negative for cough, chest tightness, shortness of breath, wheezing and stridor.   Cardiovascular:  Negative for chest pain, palpitations and leg swelling.  Gastrointestinal:  Positive for diarrhea. Negative for abdominal distention, abdominal pain, anal bleeding, blood in stool, constipation, nausea and vomiting.  Genitourinary:  Negative for difficulty urinating, dysuria, flank pain and hematuria.  Musculoskeletal:  Negative for arthralgias, back pain, gait problem, joint swelling and myalgias.  Skin:  Negative for color change, pallor, rash and wound.  Neurological:  Negative for dizziness, tremors, weakness and light-headedness.  Hematological:  Negative for adenopathy. Does not bruise/bleed easily.  Psychiatric/Behavioral:  Negative for agitation, behavioral problems, confusion, decreased concentration, dysphoric mood and sleep disturbance.        Objective:   Physical Exam Constitutional:      General: She is not in acute distress.    Appearance: Normal appearance. She is well-developed. She is not ill-appearing or diaphoretic.  HENT:     Head: Normocephalic and atraumatic.     Right Ear: Hearing and external ear normal.     Left Ear: Hearing and external ear  normal.     Nose: No nasal deformity or rhinorrhea.  Eyes:     General: No scleral icterus.    Conjunctiva/sclera: Conjunctivae normal.     Right eye: Right conjunctiva is not injected.     Left eye: Left conjunctiva is not injected.     Pupils: Pupils are equal, round, and reactive to light.  Neck:     Vascular: No JVD.  Cardiovascular:     Rate and Rhythm: Normal rate and regular rhythm.     Heart sounds: S1 normal and S2 normal.  Pulmonary:     Effort: Pulmonary effort is normal. No respiratory distress.     Breath sounds: No wheezing.  Abdominal:     General: There is no distension.     Palpations: Abdomen is soft.  Musculoskeletal:        General: Normal range of motion.     Right shoulder: Normal.     Left shoulder: Normal.     Cervical back: Normal range of motion and neck supple.     Right hip: Normal.     Left hip: Normal.     Right knee: Normal.     Left knee: Normal.  Lymphadenopathy:     Head:     Right side of head: No submandibular, preauricular or posterior auricular adenopathy.     Left side of head: No submandibular, preauricular or posterior auricular adenopathy.     Cervical: No cervical adenopathy.     Right cervical: No superficial or deep cervical adenopathy.    Left cervical: No superficial or deep cervical adenopathy.  Skin:    General: Skin is warm and dry.     Coloration: Skin is not pale.     Findings: No abrasion, bruising, ecchymosis, erythema, lesion or rash.     Nails: There is no clubbing.  Neurological:     General: No focal deficit present.     Mental Status: She is alert and oriented to person, place, and time.     Sensory: No sensory deficit.     Coordination: Coordination normal.     Gait: Gait normal.  Psychiatric:        Attention and Perception: She is attentive.        Mood and Affect: Mood normal.        Speech:  Speech normal.        Behavior: Behavior normal. Behavior is cooperative.        Thought Content: Thought content  normal.        Judgment: Judgment normal.           Assessment & Plan:   Assessment and Plan    Human immunodeficiency virus (HIV) disease HIV well-controlled with undetectable viral load and CD4 count of 474. - Continue Biktarvy . --recheck HIV RNA, CD4 and routine labs   Chronic obstructive pulmonary disease with asthma Managed with Airsupra, albuterol , and ?  Dulera . No pulmonologist involvement. - Refer to pulmonologist for asthma and COPD management.  Unintentional weight loss with chronic diarrhea Ongoing weight loss and diarrhea with poor appetite and food intolerance. Differential includes celiac disease, food intolerance, or IBS. - Refer to gastroenterologist for evaluation, including possible endoscopy.   HTN: continue amlodipine , bisoprolol /hctz   Hyperlipidemia: continue atorvastatin    Pre DM: off metformin , last a1c was 6   Vaccine counseling: she is up to date on vaccines we can give her          [1]  Allergies Allergen Reactions   Dapsone Shortness Of Breath   Penicillins Anaphylaxis    Has patient had a PCN reaction causing immediate rash, facial/tongue/throat swelling, SOB or lightheadedness with hypotension: Yes Has patient had a PCN reaction causing severe rash involving mucus membranes or skin necrosis: Yes Has patient had a PCN reaction that required hospitalization: No Has patient had a PCN reaction occurring within the last 10 years: No If all of the above answers are NO, then may proceed with Cephalosporin use.   [2]  Current Outpatient Medications:    AIRSUPRA 90-80 MCG/ACT AERO, SMARTSIG:2 inhalation Via Inhaler Every 4 Hours PRN, Disp: , Rfl:    albuterol  (VENTOLIN  HFA) 108 (90 Base) MCG/ACT inhaler, Inhale 2 puffs into the lungs every 6 (six) hours as needed for wheezing., Disp: 18 g, Rfl: 11   amLODipine  (NORVASC ) 10 MG tablet, Take 1 tablet (10 mg total) by mouth daily. Schedule appointment for check up, Disp: 30 tablet, Rfl:  1   bisoprolol -hydrochlorothiazide  (ZIAC ) 5-6.25 MG tablet, Take 1 tablet by mouth daily., Disp: 30 tablet, Rfl: 2   bisoprolol -hydrochlorothiazide  (ZIAC ) 5-6.25 MG tablet, Take 1 tablet by mouth daily., Disp: 30 tablet, Rfl: 2   cholecalciferol (VITAMIN D3) 10 MCG (400 UNIT) TABS tablet, Take 400 Units by mouth in the morning and at bedtime., Disp: , Rfl:    cycloSPORINE  (RESTASIS ) 0.05 % ophthalmic emulsion, Instill 1 drop into both eyes twice a day, Disp: 180 each, Rfl: 4   DULERA  200-5 MCG/ACT AERO, INHALE 2 PUFFS INTO THE LUNGS TWICE DAILY, Disp: 13 g, Rfl: 5   DULoxetine  (CYMBALTA ) 30 MG capsule, Take 2 capsules (60 mg total) by mouth daily., Disp: 60 capsule, Rfl: 2   LINZESS 145 MCG CAPS capsule, Take 145 mcg by mouth daily as needed (constipation). , Disp: , Rfl:    LINZESS 290 MCG CAPS capsule, Take 290 mcg by mouth daily., Disp: , Rfl:    methocarbamol (ROBAXIN) 750 MG tablet, Take 750 mg by mouth 3 (three) times daily as needed for muscle spasms. , Disp: , Rfl:    NARCAN 4 MG/0.1ML LIQD nasal spray kit, Place 1 spray into the nose as needed (accidental overdose). , Disp: , Rfl:    omeprazole  (PRILOSEC) 20 MG capsule, Take 2 capsules (40 mg total) by mouth 2 (two) times daily before a meal., Disp: 60 capsule,  Rfl: 4   oxyCODONE -acetaminophen  (PERCOCET) 10-325 MG tablet, Take 1 tablet by mouth every 4 (four) hours as needed for pain., Disp: 30 tablet, Rfl: 0   pravastatin  (PRAVACHOL ) 20 MG tablet, Take 1 tablet (20 mg total) by mouth daily., Disp: 90 tablet, Rfl: 3   TRELEGY ELLIPTA  100-62.5-25 MCG/ACT AEPB, Inhale 1 puff by mouth daily., Disp: 60 each, Rfl: 2   atorvastatin  (LIPITOR) 40 MG tablet, Take 1 tablet (40 mg total) by mouth daily as needed., Disp: 30 tablet, Rfl: 11   bictegravir-emtricitabine -tenofovir  AF (BIKTARVY ) 50-200-25 MG TABS tablet, TAKE 1 TABLET BY MOUTH DAILY., Disp: 30 tablet, Rfl: 11   metFORMIN  (GLUCOPHAGE -XR) 500 MG 24 hr tablet, Take 1 tablet (500 mg total) by  mouth 2 (two) times daily with a meal., Disp: 60 tablet, Rfl: 1  Current Facility-Administered Medications:    fluticasone  furoate-vilanterol (BREO ELLIPTA ) 100-25 MCG/INH 1 puff, 1 puff, Inhalation, Daily, Autry-Lott, Simone, DO  "

## 2024-01-04 ENCOUNTER — Other Ambulatory Visit: Payer: Self-pay

## 2024-01-04 LAB — T-HELPER CELLS (CD4) COUNT (NOT AT ARMC)
CD4 % Helper T Cell: 30 % — ABNORMAL LOW (ref 33–65)
CD4 T Cell Abs: 353 /uL — ABNORMAL LOW (ref 400–1790)

## 2024-01-05 LAB — LIPID PANEL
Cholesterol: 136 mg/dL
HDL: 72 mg/dL
LDL Cholesterol (Calc): 50 mg/dL
Non-HDL Cholesterol (Calc): 64 mg/dL
Total CHOL/HDL Ratio: 1.9 (calc)
Triglycerides: 65 mg/dL

## 2024-01-05 LAB — CBC WITH DIFFERENTIAL/PLATELET
Absolute Lymphocytes: 1394 {cells}/uL (ref 850–3900)
Absolute Monocytes: 607 {cells}/uL (ref 200–950)
Basophils Absolute: 28 {cells}/uL (ref 0–200)
Basophils Relative: 0.4 %
Eosinophils Absolute: 69 {cells}/uL (ref 15–500)
Eosinophils Relative: 1 %
HCT: 52.7 % — ABNORMAL HIGH (ref 35.9–46.0)
Hemoglobin: 17.3 g/dL — ABNORMAL HIGH (ref 11.7–15.5)
MCH: 31.9 pg (ref 27.0–33.0)
MCHC: 32.8 g/dL (ref 31.6–35.4)
MCV: 97.1 fL (ref 81.4–101.7)
MPV: 10 fL (ref 7.5–12.5)
Monocytes Relative: 8.8 %
Neutro Abs: 4802 {cells}/uL (ref 1500–7800)
Neutrophils Relative %: 69.6 %
Platelets: 235 Thousand/uL (ref 140–400)
RBC: 5.43 Million/uL — ABNORMAL HIGH (ref 3.80–5.10)
RDW: 13.7 % (ref 11.0–15.0)
Total Lymphocyte: 20.2 %
WBC: 6.9 Thousand/uL (ref 3.8–10.8)

## 2024-01-05 LAB — COMPLETE METABOLIC PANEL WITHOUT GFR
AG Ratio: 1.1 (calc) (ref 1.0–2.5)
ALT: 19 U/L (ref 6–29)
AST: 22 U/L (ref 10–35)
Albumin: 4.4 g/dL (ref 3.6–5.1)
Alkaline phosphatase (APISO): 147 U/L (ref 37–153)
BUN/Creatinine Ratio: 10 (calc) (ref 6–22)
BUN: 16 mg/dL (ref 7–25)
CO2: 29 mmol/L (ref 20–32)
Calcium: 9.7 mg/dL (ref 8.6–10.4)
Chloride: 99 mmol/L (ref 98–110)
Creat: 1.67 mg/dL — ABNORMAL HIGH (ref 0.50–1.05)
Globulin: 3.9 g/dL — ABNORMAL HIGH (ref 1.9–3.7)
Glucose, Bld: 101 mg/dL — ABNORMAL HIGH (ref 65–99)
Potassium: 3.7 mmol/L (ref 3.5–5.3)
Sodium: 140 mmol/L (ref 135–146)
Total Bilirubin: 0.6 mg/dL (ref 0.2–1.2)
Total Protein: 8.3 g/dL — ABNORMAL HIGH (ref 6.1–8.1)

## 2024-01-05 LAB — HIV-1 RNA QUANT-NO REFLEX-BLD
HIV 1 RNA Quant: <20 {copies}/mL — AB
HIV-1 RNA Quant, Log: <1.3 {Log_copies}/mL — AB

## 2024-01-05 LAB — SYPHILIS: RPR W/REFLEX TO RPR TITER AND TREPONEMAL ANTIBODIES, TRADITIONAL SCREENING AND DIAGNOSIS ALGORITHM: RPR Ser Ql: NONREACTIVE

## 2024-01-07 ENCOUNTER — Other Ambulatory Visit: Payer: Self-pay | Admitting: Family Medicine

## 2024-01-07 ENCOUNTER — Other Ambulatory Visit (HOSPITAL_COMMUNITY): Payer: Self-pay

## 2024-01-07 ENCOUNTER — Other Ambulatory Visit: Payer: Self-pay

## 2024-01-07 DIAGNOSIS — I10 Essential (primary) hypertension: Secondary | ICD-10-CM

## 2024-01-10 ENCOUNTER — Other Ambulatory Visit (HOSPITAL_COMMUNITY): Payer: Self-pay

## 2024-01-10 ENCOUNTER — Other Ambulatory Visit: Payer: Self-pay

## 2024-01-10 MED ORDER — AMLODIPINE BESYLATE 10 MG PO TABS
10.0000 mg | ORAL_TABLET | Freq: Every day | ORAL | 1 refills | Status: AC
  Filled 2024-01-10: qty 30, 30d supply, fill #0
  Filled 2024-01-26 – 2024-02-11 (×2): qty 30, 30d supply, fill #1

## 2024-01-12 ENCOUNTER — Other Ambulatory Visit (HOSPITAL_COMMUNITY): Payer: Self-pay

## 2024-01-12 ENCOUNTER — Other Ambulatory Visit: Payer: Self-pay

## 2024-01-26 ENCOUNTER — Other Ambulatory Visit: Payer: Self-pay

## 2024-01-26 ENCOUNTER — Other Ambulatory Visit (HOSPITAL_COMMUNITY): Payer: Self-pay

## 2024-01-27 ENCOUNTER — Other Ambulatory Visit (HOSPITAL_COMMUNITY): Payer: Self-pay

## 2024-01-28 ENCOUNTER — Other Ambulatory Visit (HOSPITAL_COMMUNITY): Payer: Self-pay

## 2024-01-28 ENCOUNTER — Other Ambulatory Visit: Payer: Self-pay

## 2024-02-01 ENCOUNTER — Other Ambulatory Visit: Payer: Self-pay

## 2024-02-02 ENCOUNTER — Other Ambulatory Visit: Payer: Self-pay

## 2024-02-07 ENCOUNTER — Other Ambulatory Visit: Payer: Self-pay

## 2024-02-11 ENCOUNTER — Other Ambulatory Visit: Payer: Self-pay

## 2024-02-11 NOTE — Progress Notes (Signed)
 Specialty Pharmacy Ongoing Clinical Assessment Note  Molly Dillon is a 63 y.o. female who is being followed by the specialty pharmacy service for RxSp HIV   Patient's specialty medication(s) reviewed today: Bictegravir-Emtricitab-Tenofov (Biktarvy )   Missed doses in the last 4 weeks: 0   Patient/Caregiver did not have any additional questions or concerns.   Therapeutic benefit summary: Patient is achieving benefit   Adverse events/side effects summary: No adverse events/side effects   Patient's therapy is appropriate to: Continue    Goals Addressed             This Visit's Progress    Achieve Undetectable HIV Viral Load < 20   On track    Patient is on track. Patient will maintain adherence.  Viral load remains undetectable as of 01/03/24.         Follow up: 12 months  [facility]

## 2024-02-11 NOTE — Progress Notes (Signed)
 Specialty Pharmacy Refill Coordination Note  Molly Dillon is a 64 y.o. female contacted today regarding refills of specialty medication(s) Bictegravir-Emtricitab-Tenofov (Biktarvy )   Patient requested Delivery   Delivery date: 02/15/24   Verified address: [address], [place] [address]   Medication will be filled on: 02/14/24

## 2024-02-14 ENCOUNTER — Other Ambulatory Visit: Payer: Self-pay

## 2024-05-18 ENCOUNTER — Encounter

## 2024-06-08 ENCOUNTER — Encounter

## 2024-07-03 ENCOUNTER — Ambulatory Visit: Payer: Self-pay | Admitting: Infectious Disease
